# Patient Record
Sex: Female | Born: 1935 | Race: White | Hispanic: No | Marital: Married | Smoking: Never smoker
Health system: Southern US, Community
[De-identification: ages and names within clinical notes are randomized; demographics above are authoritative.]

## PROBLEM LIST (undated history)

## (undated) DIAGNOSIS — T4145XA Adverse effect of unspecified anesthetic, initial encounter: Secondary | ICD-10-CM

## (undated) DIAGNOSIS — T8859XA Other complications of anesthesia, initial encounter: Secondary | ICD-10-CM

## (undated) DIAGNOSIS — R030 Elevated blood-pressure reading, without diagnosis of hypertension: Secondary | ICD-10-CM

## (undated) DIAGNOSIS — IMO0001 Reserved for inherently not codable concepts without codable children: Secondary | ICD-10-CM

## (undated) DIAGNOSIS — I5042 Chronic combined systolic (congestive) and diastolic (congestive) heart failure: Secondary | ICD-10-CM

## (undated) DIAGNOSIS — K219 Gastro-esophageal reflux disease without esophagitis: Secondary | ICD-10-CM

## (undated) DIAGNOSIS — R011 Cardiac murmur, unspecified: Secondary | ICD-10-CM

## (undated) DIAGNOSIS — J45909 Unspecified asthma, uncomplicated: Secondary | ICD-10-CM

## (undated) DIAGNOSIS — C801 Malignant (primary) neoplasm, unspecified: Secondary | ICD-10-CM

## (undated) DIAGNOSIS — R519 Headache, unspecified: Secondary | ICD-10-CM

## (undated) HISTORY — PX: BREAST SURGERY: SHX581

## (undated) HISTORY — DX: Elevated blood-pressure reading, without diagnosis of hypertension: R03.0

## (undated) HISTORY — DX: Reserved for inherently not codable concepts without codable children: IMO0001

---
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
## 1972-01-21 HISTORY — PX: ABDOMINAL HYSTERECTOMY: SHX81

## 1997-11-27 ENCOUNTER — Encounter: Admission: RE | Admit: 1997-11-27 | Discharge: 1997-11-27 | Payer: Self-pay | Admitting: Sports Medicine

## 2001-01-20 LAB — HM MAMMOGRAPHY

## 2003-04-11 ENCOUNTER — Other Ambulatory Visit: Payer: Self-pay

## 2011-08-24 ENCOUNTER — Emergency Department: Payer: Self-pay | Admitting: Emergency Medicine

## 2011-08-24 LAB — CBC
HCT: 38.4 % (ref 35.0–47.0)
HGB: 13.7 g/dL (ref 12.0–16.0)
MCV: 99 fL (ref 80–100)
Platelet: 231 10*3/uL (ref 150–440)
RBC: 3.9 10*6/uL (ref 3.80–5.20)
RDW: 13 % (ref 11.5–14.5)
WBC: 8.8 10*3/uL (ref 3.6–11.0)

## 2011-08-24 LAB — COMPREHENSIVE METABOLIC PANEL
Anion Gap: 9 (ref 7–16)
BUN: 25 mg/dL — ABNORMAL HIGH (ref 7–18)
Chloride: 106 mmol/L (ref 98–107)
Creatinine: 0.98 mg/dL (ref 0.60–1.30)
EGFR (African American): >60
EGFR (Non-African Amer.): 56 — ABNORMAL LOW
Glucose: 157 mg/dL — ABNORMAL HIGH (ref 65–99)
Osmolality: 287 (ref 275–301)
SGOT(AST): 23 U/L (ref 15–37)
SGPT (ALT): 19 U/L (ref 12–78)
Sodium: 140 mmol/L (ref 136–145)
Total Protein: 6.5 g/dL (ref 6.4–8.2)

## 2011-08-24 LAB — CK TOTAL AND CKMB (NOT AT ARMC): CK-MB: 1.9 ng/mL (ref 0.5–3.6)

## 2011-08-24 LAB — PRO B NATRIURETIC PEPTIDE: B-Type Natriuretic Peptide: 68 pg/mL (ref 0–450)

## 2011-10-27 ENCOUNTER — Other Ambulatory Visit: Payer: Self-pay | Admitting: *Deleted

## 2011-10-27 ENCOUNTER — Ambulatory Visit (INDEPENDENT_AMBULATORY_CARE_PROVIDER_SITE_OTHER): Payer: Self-pay | Admitting: Internal Medicine

## 2011-10-27 ENCOUNTER — Encounter: Payer: Self-pay | Admitting: Internal Medicine

## 2011-10-27 VITALS — BP 130/70 | HR 74 | Temp 98.7°F | Ht 61.0 in | Wt 129.2 lb

## 2011-10-27 DIAGNOSIS — J329 Chronic sinusitis, unspecified: Secondary | ICD-10-CM

## 2011-10-27 DIAGNOSIS — M25559 Pain in unspecified hip: Secondary | ICD-10-CM

## 2011-10-27 DIAGNOSIS — J069 Acute upper respiratory infection, unspecified: Secondary | ICD-10-CM

## 2011-10-27 MED ORDER — AZITHROMYCIN 250 MG PO TABS: ORAL_TABLET | ORAL | Status: DC

## 2011-10-27 NOTE — Patient Instructions (Signed)
It was good to see you today.  I am sorry you are not feeling well.  I am going to give you a Zpak to take as instructed.  Flush your nose with saline nasal spray - 2-3x/day.  Plain Robitussin as directed.  I am also going to refer you to an orthopedist for evaluation of the persistant hip and leg pain.

## 2011-10-27 NOTE — Progress Notes (Signed)
  Subjective:    [name], female    DOB: 1933-07-14, 78 y.o.   MRN: [id number]  HPI 78 year old female who comes in today as a work in with concerns regarding increased sinus congestion, cough and sore throat.  Her daughter just recently passed away. Feels she is handling this stress relatively well.  Symptoms started two days ago.  Started with a scratchy throat, nasal congestion (productive of yellow mucus), sore throat and subjective fever.  Some minimal chest congestion and cough.  No sob, chest tightness or pain.  Has been taking vitamin C and a cough suppressant.  She also reported some increased left hip pain and pain that radiates down her upper left leg.  No numbness or tingling.  This has been bothering her for at least six months.  No weakness.  She rides a [employer] - a lot.  Feels this may be aggravating.    Past Medical History  Diagnosis Date  . Elevated blood pressure     Review of Systems Patient denies any headache, lightheadedness or dizziness, but does report increased sinus pressure and symptoms as outlined above.   No chest pain, tightness or palpatations.  No increased shortness of breath or wheezing.   No nausea or vomiting.  No abdominal pain or cramping. Tolerating po's.  No bowel change, such as diarrhea, constipation, BRBPR or melana.  No urine change.        Objective:   Physical Exam Filed Vitals:   10/27/11 1139  BP: 130/70  Pulse: 74  Temp: 98.7 F (96.33 C)   78 year old female in no acute distress.   HEENT:  Nares - clear except slightly erythematous turbinates.  Increased maxillary sinus pressure to palpation.  Left TM - no redness.  Cerumen present right canal.  TM visualized revealed no erythema.  OP- without lesions or erythema.  NECK:  Supple, nontender.  No lymphadenopathy.Marland Kitchen   HEART:  Appears to be regular. LUNGS:  Without crackles or wheezing audible.  Respirations even and unlabored.  Good breath sounds bilaterally.   RADIAL PULSE:   Equal bilaterally.  EXTREMITIES:  No increased edema to be present.  Increased pain to palpation over the left lateral hip.  Increased pain (in the lateral hip) with straight leg raise.  No lower back pain.  No weakness.            Assessment & Plan:  SINUSITIS/URI.  Treat with Zpak as directed.  Saline nasal flushes - 2-3x/day.  Robitussin as directed.  Rest.  Fluids.  Explained to her if symptoms changed, worsened or did not resolve - she was to be reevaluated.    HIP/LEG PAIN.  Exam as outlined.  Previous xray revealed no acute abnormality.  Avoid increased antiinflammatories given problems with elevated blood pressure.  Refer to ortho to see if she would benefit from an injection or further workup/scanning.    ELEVATED BLOOD PRESSURE.  Blood pressure is under good control today.  Have her to continue to follow.

## 2011-10-27 NOTE — Telephone Encounter (Signed)
Rx for Zpak called to [facility] pharmacy.

## 2011-11-03 ENCOUNTER — Telehealth: Payer: Self-pay | Admitting: Internal Medicine

## 2011-11-03 NOTE — Telephone Encounter (Signed)
error 

## 2011-12-10 ENCOUNTER — Emergency Department: Payer: Self-pay | Admitting: Unknown Physician Specialty

## 2011-12-11 ENCOUNTER — Telehealth: Payer: Self-pay | Admitting: Internal Medicine

## 2011-12-11 NOTE — Telephone Encounter (Signed)
Patient returned call. John wolf gave her etodolac. That's what she said had given her the stomach cramps. She wants enough pills to get her through Sunday following thanksgiving. Faxed [facility] for er records.

## 2011-12-11 NOTE — Telephone Encounter (Signed)
Left message for patient to return call.

## 2011-12-11 NOTE — Telephone Encounter (Signed)
Cell phone # [phone] Pt came in today stating she went to [facility] er yesterday for stomach cramps.  They have her on  Prednisone 50mg  tab take one daily this has helped her stomach.  They only gave her 5 pills and pt was concerned that during the thanksgiving her stomach would starting cramping again and wanted to know if dr scott could write her a rx for this in case her stomach starts hurting again [facility] [address] [place]

## 2011-12-11 NOTE — Telephone Encounter (Signed)
I don't know if she has the wrong medicine.  Prednisone is not used to treat stomach cramps.  Need records to review.  Hold until receive.

## 2011-12-11 NOTE — Telephone Encounter (Signed)
Please call pt and find out how she is doing and what they diagnosed her with.  Prednisone is not the usual medicine for stomach cramps.  This is usually not a prescribed as a standing medicine.  Also need er records.

## 2011-12-15 ENCOUNTER — Telehealth: Payer: Self-pay | Admitting: *Deleted

## 2011-12-15 ENCOUNTER — Encounter: Payer: Self-pay | Admitting: *Deleted

## 2011-12-15 DIAGNOSIS — M25559 Pain in unspecified hip: Secondary | ICD-10-CM

## 2011-12-15 NOTE — Telephone Encounter (Signed)
Kathleen Wallace stated that her stomach was better since she stop taking etodolac and changed her diet. She requested PT for her hip   

## 2011-12-15 NOTE — Telephone Encounter (Signed)
i placed order for PT - [facility]  Physical therapy for persistent hip pain

## 2011-12-15 NOTE — Telephone Encounter (Signed)
Mrs. Boreman stated that her stomach was better since she stop taking etodolac and changed her diet. She requested PT for her hip

## 2011-12-19 NOTE — Telephone Encounter (Signed)
Left patient a message on answering machine regarding order for PT

## 2012-02-11 ENCOUNTER — Telehealth: Payer: Self-pay | Admitting: Internal Medicine

## 2012-02-11 NOTE — Telephone Encounter (Signed)
Per Dr. Lorin Picket schedule a CPE after 07/31/12 left message for pt to call back and schedule. Once Scheduled please write date and time on paper medical records (top shelf wooden cabinet [facility]) give back to Dr. Lorin Picket

## 2012-06-04 ENCOUNTER — Ambulatory Visit (INDEPENDENT_AMBULATORY_CARE_PROVIDER_SITE_OTHER): Payer: Medicare Other | Admitting: Internal Medicine

## 2012-06-04 ENCOUNTER — Encounter: Payer: Self-pay | Admitting: Internal Medicine

## 2012-06-04 ENCOUNTER — Ambulatory Visit (INDEPENDENT_AMBULATORY_CARE_PROVIDER_SITE_OTHER)
Admission: RE | Admit: 2012-06-04 | Discharge: 2012-06-04 | Disposition: A | Discharge status: Home / Self Care | Payer: Medicare Other | Source: Ambulatory Visit | Attending: Internal Medicine | Admitting: Internal Medicine

## 2012-06-04 VITALS — BP 160/80 | HR 67 | Temp 98.3°F | Ht 61.0 in | Wt 135.2 lb

## 2012-06-04 DIAGNOSIS — R059 Cough, unspecified: Secondary | ICD-10-CM

## 2012-06-04 DIAGNOSIS — R05 Cough: Secondary | ICD-10-CM

## 2012-06-04 DIAGNOSIS — R011 Cardiac murmur, unspecified: Secondary | ICD-10-CM

## 2012-06-04 DIAGNOSIS — K219 Gastro-esophageal reflux disease without esophagitis: Secondary | ICD-10-CM

## 2012-06-04 MED ORDER — ALBUTEROL SULFATE HFA 108 (90 BASE) MCG/ACT IN AERS: 2.0000 | INHALATION_SPRAY | Freq: Four times a day (QID) | RESPIRATORY_TRACT | Status: DC | PRN

## 2012-06-04 MED ORDER — AZITHROMYCIN 250 MG PO TABS: ORAL_TABLET | ORAL | Status: DC

## 2012-06-04 NOTE — Patient Instructions (Signed)
Flush with saline nasal spray 2-3x/day.  Nasonex - 2 sprays each nostril in the evening.  Take the Zpak as directed.  Mucinex in the am and robitussin in the evening.  Albuterol inhaler - 2 puffs 4x/day as needed.  Let me know if persistent problems.

## 2012-06-06 ENCOUNTER — Encounter: Payer: Self-pay | Admitting: Internal Medicine

## 2012-06-06 DIAGNOSIS — K219 Gastro-esophageal reflux disease without esophagitis: Secondary | ICD-10-CM | POA: Insufficient documentation

## 2012-06-06 DIAGNOSIS — R011 Cardiac murmur, unspecified: Secondary | ICD-10-CM | POA: Insufficient documentation

## 2012-06-06 NOTE — Assessment & Plan Note (Signed)
Treat acid reflux with zantac.  Follow.

## 2012-06-06 NOTE — Progress Notes (Signed)
  Subjective:    [name], female    DOB: 1933-04-23, 79 y.o.   MRN: [id number]  Wheezing   79 year old female who comes in today as a work in with concerns regarding increased sinus congestion, cough and chest congestion.  I saw her in October for similar symptoms.  She was treated.  States she is not sure she ever fully got over that episode.  She has noticed recently, some worsening symptoms.  Increased sinus pressure and nasal congestion.  Increased cough - productive of thick mucus.  Some wheezing occasionally.  No vomiting or diarrhea.  Taking benadryl and vitamins.  Eating.  She states she checked her blood pressure yesterday - 138/80.     Past Medical History  Diagnosis Date  . Elevated blood pressure     Review of Systems  Respiratory: Positive for wheezing.   Patient denies any significant headache, lightheadedness or dizziness, but does report increased sinus pressure and symptoms as outlined above.   No chest pain or palpitations.  Increased cough and wheezing.  Increased chest congestion.  Some minimal acid reflux.  No nausea or vomiting.  No abdominal pain or cramping. Tolerating po's.  No bowel change, such as diarrhea, constipation, BRBPR or melana.  No urine change.        Objective:   Physical Exam  Filed Vitals:   06/04/12 1215  BP: 160/80  Pulse: 67  Temp: 98.3 F (78.53 C)   79 year old female in no acute distress.   HEENT:  Nares - clear except slightly erythematous turbinates.  Increased maxillary sinus pressure to palpation.  Left TM - no redness.  Cerumen present right canal.  TM visualized revealed no erythema.  OP- without lesions or erythema.  NECK:  Supple, nontender.  No lymphadenopathy.Marland Kitchen   HEART:  Appears to be regular.  I-II/VI systolic murmur.  LUNGS:  Without crackles or wheezing audible.  Respirations even and unlabored.  Good breath sounds bilaterally.  Increased cough with forced expiration.  RADIAL PULSE:  Equal bilaterally.           Assessment & Plan:  SINUSITIS/URI.  Treat with Zpak as directed.  Saline nasal flushes - 2-3x/day.  Mucinex/ Robitussin as directed.  Albuterol inhaler as directed.  Treat acid reflux.  Rest.  Fluids.  Explained to her if symptoms changed, worsened or did not resolve - she was to be reevaluated.  Will check cxr given her persistent symptoms.    HIP/LEG PAIN.  Not reported as a problem today.     ELEVATED BLOOD PRESSURE.  Blood pressure elevated today.  She states it is under good control at home.  Have discussed medication with her.  She declines.  Will follow.  Have her to continue to follow.

## 2012-06-06 NOTE — Assessment & Plan Note (Signed)
Discussed with her regarding my desire for an ECHO.  She declines.  Will notify me if she changes her mind.

## 2012-08-02 ENCOUNTER — Ambulatory Visit (INDEPENDENT_AMBULATORY_CARE_PROVIDER_SITE_OTHER): Payer: Medicare Other | Admitting: Internal Medicine

## 2012-08-02 ENCOUNTER — Encounter: Payer: Self-pay | Admitting: Internal Medicine

## 2012-08-02 VITALS — BP 130/80 | HR 81 | Temp 98.3°F | Ht 61.0 in | Wt 130.8 lb

## 2012-08-02 DIAGNOSIS — E7800 Pure hypercholesterolemia, unspecified: Secondary | ICD-10-CM

## 2012-08-02 DIAGNOSIS — R062 Wheezing: Secondary | ICD-10-CM

## 2012-08-02 DIAGNOSIS — R0602 Shortness of breath: Secondary | ICD-10-CM

## 2012-08-02 DIAGNOSIS — Z9109 Other allergy status, other than to drugs and biological substances: Secondary | ICD-10-CM

## 2012-08-02 DIAGNOSIS — R011 Cardiac murmur, unspecified: Secondary | ICD-10-CM

## 2012-08-02 DIAGNOSIS — K219 Gastro-esophageal reflux disease without esophagitis: Secondary | ICD-10-CM

## 2012-08-02 NOTE — Patient Instructions (Signed)
Zantac (ranitidine) 150mg - take 30 minutes before your evening meal.   

## 2012-08-03 ENCOUNTER — Encounter: Payer: Self-pay | Admitting: Internal Medicine

## 2012-08-03 ENCOUNTER — Encounter: Payer: Self-pay | Admitting: Pulmonary Disease

## 2012-08-03 ENCOUNTER — Ambulatory Visit (INDEPENDENT_AMBULATORY_CARE_PROVIDER_SITE_OTHER): Payer: Medicare Other | Admitting: Pulmonary Disease

## 2012-08-03 VITALS — BP 142/82 | HR 78 | Temp 98.7°F | Ht 61.0 in | Wt 131.0 lb

## 2012-08-03 DIAGNOSIS — K219 Gastro-esophageal reflux disease without esophagitis: Secondary | ICD-10-CM

## 2012-08-03 DIAGNOSIS — R062 Wheezing: Secondary | ICD-10-CM | POA: Insufficient documentation

## 2012-08-03 DIAGNOSIS — Z9109 Other allergy status, other than to drugs and biological substances: Secondary | ICD-10-CM

## 2012-08-03 DIAGNOSIS — R0602 Shortness of breath: Secondary | ICD-10-CM

## 2012-08-03 NOTE — Assessment & Plan Note (Addendum)
I have a very high index of suspicion for vocal cord dysfunction here. It would be exceedingly unusual for a 79 year old female developed asthma at this point in life.  Many features of her disease are consistent with vocal cord dysfunction specifically the fact that the wheezing started exactly 48 hours after the death of her 89 year old daughter. Further, exacerbation of the wheezing by postnasal drip and ongoing indigestion and acid reflux are very consistent with a vocal cord abnormality.  She has mild airflow obstruction on pulmonary function testing but this can be a normal finding in a 79 year old.  For now, I think it is reasonable to consider treatment for asthma with the Flovent, but I'm hopeful that we'll be able to stop this.  Plan: -Methacholine challenge -If methacholine challenge is negative then refer her to ear nose and throat for evaluation of vocal cords -Continue Flovent for now -Zyrtec and Nasacort for postnasal drip -Gastroesophageal reflux disease lifestyle modification as well as Zantac as prescribed by her primary care physician -Followup with me in 4-6 weeks

## 2012-08-03 NOTE — Assessment & Plan Note (Signed)
Discussed with her regarding my desire for an ECHO.  She declines.  Will notify me if she changes her mind.

## 2012-08-03 NOTE — Assessment & Plan Note (Signed)
Persistent symptoms.  Previous albuterol inhaler did help some.  Recent CXR negative.  Treat the acid reflux and allergies as outlined.  Gave her a sample of Flovent discus and instructed on proper technique.  Albuterol inhaler as needed.  Also, given persistent symptoms will refer to pulmonary for further evaluation and treatment.

## 2012-08-03 NOTE — Progress Notes (Signed)
  Subjective:    [name], female    DOB: 31-May-1933, 79 y.o.   MRN: [id number]  Wheezing   79 year old female who comes in today for her complete physical exam.  I saw her in October and in May for wheezing.  She comes in today stating she continues to have increased wheezing and increased cough/congestion.  Reports increased cough - productive of thick mucus (occasionally yellow).  Some wheezing - noticed mostly at night.   Occasional nasal stuffiness and sore throat.  Sometimes feels some chest heaviness and feels she is not getting enough air.  Some acid reflux at times.  No vomiting or diarrhea.  Eating well.  Works outside.  Reports previous exposure to multiple chemicals.  Does not wear a mask regularly when she is working.      Past Medical History  Diagnosis Date  . Elevated blood pressure     Current Outpatient Prescriptions on File Prior to Visit  Medication Sig Dispense Refill  . albuterol (PROVENTIL HFA;VENTOLIN HFA) 108 (90 BASE) MCG/ACT inhaler Inhale 2 puffs into the lungs every 6 (six) hours as needed for wheezing.  1 Inhaler  0   No current facility-administered medications on file prior to visit.    Review of Systems  Respiratory: Positive for wheezing.   Patient denies any significant headache, lightheadedness or dizziness, but does report increased nasal stuffiness and symptoms as outlined above.   No chest pain or palpitations.  Does report increased chest heaviness.  Increased cough and wheezing.  Increased chest congestion.  Some minimal acid reflux.  No nausea or vomiting.  No abdominal pain or cramping. Tolerating po's.  No bowel change, such as diarrhea, constipation, BRBPR or melana.  No urine change.        Objective:   Physical Exam  Filed Vitals:   08/02/12 1344  BP: 130/80  Pulse: 81  Temp: 98.3 F (23.69 C)   79 year old female in no acute distress.   HEENT:  Nares- clear.  Oropharynx - without lesions. NECK:  Supple.  Nontender.  No  audible bruit.  HEART:  Appears to be regular.  II/VI systolic murmur.   LUNGS:  No crackles or wheezing audible.  Respirations even and unlabored.  RADIAL PULSE:  Equal bilaterally.    BREASTS:  No nipple discharge or nipple retraction present.  Could not appreciate any distinct nodules or axillary adenopathy.  Implants in place.   ABDOMEN:  Soft, nontender.  Bowel sounds present and normal.  No audible abdominal bruit.  GU:  Not performed.  RECTAL:  Not performed.   EXTREMITIES:  No increased edema present.  DP pulses palpable and equal bilaterally.          Assessment & Plan:  CARDIOVASCULAR.  Given the chest tightness, EKG obtained and revealed SR with no acute ischemic changes.  PACs present.  Discussed with her regarding further cardiac w/u including an ECHO to reevaluate her valve status (given the murmur).  She declines any further cardiac w/up at this time.  Will notify me if symptoms change or if she changes her mind.    HIP/LEG PAIN.  Not reported as a problem today.     ELEVATED BLOOD PRESSURE.  Blood pressure better today.  She states it is under good control at home.  Follow.  Check metabolic panel.   HEALTH MAINTENANCE.  Physical today.  She declines mammogram and colon evaluation.  Declines bone density.

## 2012-08-03 NOTE — Assessment & Plan Note (Signed)
Gave her a sample of Nasonex.  Use saline nasal spray.

## 2012-08-03 NOTE — Assessment & Plan Note (Signed)
Low cholesterol diet.  She has declined medication.  Check lipid panel.

## 2012-08-03 NOTE — Assessment & Plan Note (Signed)
Lifestyle modification encouraged -Agree with Zantac

## 2012-08-03 NOTE — Assessment & Plan Note (Signed)
Intermittent acid reflux.  Cough/wheezing worse at night.  Treat acid reflux with zantac.  Confirm this is not aggravating her current symptoms.  Follow.

## 2012-08-03 NOTE — Assessment & Plan Note (Signed)
Recommended Zyrtec and over-the-counter Nasacort

## 2012-08-03 NOTE — Progress Notes (Signed)
Subjective:    [name], female    DOB: 27-Jul-1933, 79 y.o.   MRN: [id number]  HPI  This is a 79 year old female with a past medical history significant for a heart murmur who comes to our clinic today for evaluation of ongoing wheezing. She stated that she never had respiratory symptoms as a child her throughout adulthood but approximately 2 days after her 38 year old daughter died last year she started developing wheezing. At that point she had a sore throat and was treated with a Z-Pak and had some resolution of the symptom but has had continued wheezing ever since. The wheezing is associated with some shortness of breath. It is exacerbated by exposure to dust, chemicals or fumes. Often she will wake up wheezing in the middle the night and has to cough up mucus which she says is collected around her voice box and upper airway. This is also is associated with significant sinus congestion. Notably, she previously had acid reflux which is well-controlled with the vegetarian diet but in the last year since the death of her daughter she has not been needing as well and states that she has had more "indigestion". She uses albuterol which helps with her symptoms and she was just started on Flovent yesterday. She states that back in May of 2014 she was treated with antibiotics and steroids again for wheezing and a sore throat but states that she continues to have symptoms despite this. She tells me that she has been under a fair amount of stress with a lot of work responsibilities and clearly her family has been upset and distressed with the death of her daughter. She tells that she has suffered with depression since the death. 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 On days during which there is a significant amount of dust her wheezing is worse and on days when she uses pesticides she thinks the wheezing is worse. She has recently  started using a mask.     Past Medical History  Diagnosis Date  . Elevated blood pressure      Family History  Problem Relation Age of Onset  . Stroke Mother   . Hypertension Mother   . Stroke Father   . Hypertension Father   . Diabetes Other      History   Social History  . Marital Status: Single    Spouse Name: N/A    Number of Children: N/A  . Years of Education: N/A   Occupational History  . Not on file.   Social History Main Topics  . Smoking status: Never Smoker   . Smokeless tobacco: Never Used  . Alcohol Use: No  . Drug Use: No  . Sexually Active: Not on file   Other Topics Concern  . Not on file   Social History Narrative  . No narrative on file     Allergies  Allergen Reactions  . Codeine Nausea Only and Other (See Comments)    tachycardia  . Amoxicillin     insomnia     Outpatient Prescriptions Prior to Visit  Medication Sig Dispense Refill  . albuterol (PROVENTIL HFA;VENTOLIN HFA) 108 (90 BASE) MCG/ACT inhaler Inhale 2 puffs into the lungs every 6 (six) hours as needed for wheezing.  1 Inhaler  0  . Ascorbic Acid (VITAMIN C PO) Take by mouth.       No facility-administered medications prior to visit.  Review of Systems  Constitutional: Negative for fever, chills, diaphoresis, activity change, appetite change, fatigue and unexpected weight change.  HENT: Positive for congestion, sore throat and postnasal drip. Negative for hearing loss, ear pain, nosebleeds, facial swelling, rhinorrhea, sneezing, mouth sores, trouble swallowing, neck pain, neck stiffness, dental problem, voice change, sinus pressure, tinnitus and ear discharge.   Eyes: Negative for photophobia, discharge, itching and visual disturbance.  Respiratory: Positive for cough, shortness of breath and wheezing. Negative for apnea, choking, chest tightness and stridor.   Cardiovascular: Negative for chest pain, palpitations and leg swelling.  Gastrointestinal: Negative for  nausea, vomiting, abdominal pain, constipation, blood in stool and abdominal distention.  Genitourinary: Negative for dysuria, urgency, frequency, hematuria, flank pain, decreased urine volume and difficulty urinating.  Musculoskeletal: Negative for myalgias, back pain, joint swelling, arthralgias and gait problem.  Skin: Positive for rash. Negative for color change and pallor.  Neurological: Negative for dizziness, tremors, seizures, syncope, speech difficulty, weakness, light-headedness, numbness and headaches.  Hematological: Negative for adenopathy. Does not bruise/bleed easily.  Psychiatric/Behavioral: Positive for dysphoric mood. Negative for confusion, sleep disturbance and agitation. The patient is not nervous/anxious.        Objective:   Physical Exam  Filed Vitals:   08/03/12 1533  BP: 142/82  Pulse: 78  Temp: 98.7 F (37.1 C)  TempSrc: Oral  Height: 5\' 1"  (1.549 m)  Weight: 59.421 kg (131 lb)  SpO2: 94%   Gen: well appearing, no acute distress HEENT: NCAT, PERRL, EOMi, OP clear, neck supple without masses PULM: inspiratory wheezing, loudest over the proximal trachea CV: RRR, slight systolic murmur, no JVD AB: BS+, soft, nontender, no hsm Ext: warm, no edema, no clubbing, no cyanosis Derm: no rash or skin breakdown Neuro: A&Ox4, CN II-XII intact, strength 5/5 in all 4 extremities  08/03/2012 simple spirometry ratio 66%, FEV1 1.64 L (94% predicted) flow volume loop delayed, consistent with obstruction     Assessment & Plan:   Wheezing I have a very high index of suspicion for vocal cord dysfunction here. It would be exceedingly unusual for a 79 year old female developed asthma at this point in life.  Many features of her disease are consistent with vocal cord dysfunction specifically the fact that the wheezing started exactly 48 hours after the death of her 53 year old daughter. Further, exacerbation of the wheezing by postnasal drip and ongoing indigestion and acid  reflux are very consistent with a vocal cord abnormality.  She has mild airflow obstruction on pulmonary function testing but this can be a normal finding in a 79 year old.  For now, I think it is reasonable to consider treatment for asthma with the Flovent, but I'm hopeful that we'll be able to stop this.  Plan: -Methacholine challenge -If methacholine challenge is negative then refer her to ear nose and throat for evaluation of vocal cords -Continue Flovent for now -Zyrtec and Nasacort for postnasal drip -Gastroesophageal reflux disease lifestyle modification as well as Zantac as prescribed by her primary care physician -Followup with me in 4-6 weeks  GERD (gastroesophageal reflux disease) Lifestyle modification encouraged -Agree with Zantac  Environmental allergies Recommended Zyrtec and over-the-counter Nasacort    Updated Medication List Outpatient Encounter Prescriptions as of 08/03/2012  Medication Sig Dispense Refill  . albuterol (PROVENTIL HFA;VENTOLIN HFA) 108 (90 BASE) MCG/ACT inhaler Inhale 2 puffs into the lungs every 6 (six) hours as needed for wheezing.  1 Inhaler  0  . Ascorbic Acid (VITAMIN C PO) Take by mouth.  No facility-administered encounter medications on file as of 08/03/2012.

## 2012-08-03 NOTE — Patient Instructions (Addendum)
We will arrange a methacholine challenge test at [facility] Keep using your Flovent as you are doing Use your albuterol as you are doing  Use generic zyrtec and over the counter nasacort regularly to help with your sinus congestion  Follow the reflux lifestyle modifications we gave you and use the Zantac as recommended by Dr. Lorin Picket  We will see you back in 4-6 weeks or sooner if needed

## 2012-09-01 ENCOUNTER — Telehealth: Payer: Self-pay | Admitting: Pulmonary Disease

## 2012-09-01 NOTE — Telephone Encounter (Signed)
Called pt to let her know that we do not do methacholine challenges here at [facility] office. She is scheduled for Friday at 9am at the [place] office. I have rescheduled her for 09/06/2012 at 11am at [facility].  [facility]

## 2012-09-02 NOTE — Telephone Encounter (Signed)
Pt is aware that her appointment has been changed. States that this date and time is not convenient for her. I gave her the # to respiratory to she can reschedule.

## 2012-09-03 ENCOUNTER — Telehealth: Payer: Self-pay | Admitting: Pulmonary Disease

## 2012-09-03 NOTE — Telephone Encounter (Signed)
I spoke with pt and advised her once she has the test they should fax results over to [place]. Nothing further needed

## 2012-09-06 ENCOUNTER — Ambulatory Visit (HOSPITAL_COMMUNITY)
Admission: RE | Admit: 2012-09-06 | Discharge: 2012-09-06 | Disposition: A | Discharge status: Home / Self Care | Payer: Medicare Other | Source: Ambulatory Visit | Attending: Pulmonary Disease | Admitting: Pulmonary Disease

## 2012-09-06 DIAGNOSIS — R0602 Shortness of breath: Secondary | ICD-10-CM | POA: Insufficient documentation

## 2012-09-06 MED ORDER — ALBUTEROL SULFATE (5 MG/ML) 0.5% IN NEBU
2.5000 mg | INHALATION_SOLUTION | Freq: Once | RESPIRATORY_TRACT | Status: AC
  Administered 2012-09-06: 2.5 mg via RESPIRATORY_TRACT

## 2012-09-06 MED ORDER — METHACHOLINE 1 MG/ML NEB SOLN
2.0000 mL | Freq: Once | RESPIRATORY_TRACT | Status: AC
  Administered 2012-09-06: 2 mg via RESPIRATORY_TRACT

## 2012-09-06 MED ORDER — METHACHOLINE 0.0625 MG/ML NEB SOLN
2.0000 mL | Freq: Once | RESPIRATORY_TRACT | Status: AC
  Administered 2012-09-06: 0.125 mg via RESPIRATORY_TRACT

## 2012-09-06 MED ORDER — METHACHOLINE 16 MG/ML NEB SOLN
2.0000 mL | Freq: Once | RESPIRATORY_TRACT | Status: AC
  Administered 2012-09-06: 32 mg via RESPIRATORY_TRACT

## 2012-09-06 MED ORDER — SODIUM CHLORIDE 0.9 % IN NEBU
3.0000 mL | INHALATION_SOLUTION | Freq: Once | RESPIRATORY_TRACT | Status: AC
  Administered 2012-09-06: 3 mL via RESPIRATORY_TRACT

## 2012-09-06 MED ORDER — METHACHOLINE 4 MG/ML NEB SOLN
2.0000 mL | Freq: Once | RESPIRATORY_TRACT | Status: AC
  Administered 2012-09-06: 8 mg via RESPIRATORY_TRACT

## 2012-09-06 MED ORDER — METHACHOLINE 0.25 MG/ML NEB SOLN
2.0000 mL | Freq: Once | RESPIRATORY_TRACT | Status: AC
  Administered 2012-09-06: 0.5 mg via RESPIRATORY_TRACT

## 2012-09-07 ENCOUNTER — Encounter: Payer: Self-pay | Admitting: Pulmonary Disease

## 2012-09-07 ENCOUNTER — Ambulatory Visit (INDEPENDENT_AMBULATORY_CARE_PROVIDER_SITE_OTHER): Payer: Medicare Other | Admitting: Pulmonary Disease

## 2012-09-07 VITALS — BP 142/82 | HR 74 | Ht 61.0 in | Wt 129.0 lb

## 2012-09-07 DIAGNOSIS — K219 Gastro-esophageal reflux disease without esophagitis: Secondary | ICD-10-CM

## 2012-09-07 DIAGNOSIS — Z9109 Other allergy status, other than to drugs and biological substances: Secondary | ICD-10-CM

## 2012-09-07 DIAGNOSIS — R062 Wheezing: Secondary | ICD-10-CM

## 2012-09-07 NOTE — Assessment & Plan Note (Signed)
Her methacholine challenge test was negative, so she does not have asthma. 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  Plan: -treat allergic rhinitis and GERD aggressively as these will exacerbate VCD -if no improvement or recurrence in symptoms, then see [facility] for vocal cord evaluation

## 2012-09-07 NOTE — Patient Instructions (Addendum)
Take generic Zyrtec on the days you are out in the grass (cetirizine) Use a mask when you are out in the grass on the lawn mower  Follow the GERD lifestyle modification sheet we gave you and use Zantac at night if you still have the cough when you lay down  If you have recurrent wheezing, cough, we think you should see [facility] to evaluate your vocal cords

## 2012-09-07 NOTE — Assessment & Plan Note (Signed)
Use a mask when out on the lawn mower, zyrtec on those days as well (generic OK)

## 2012-09-07 NOTE — Progress Notes (Signed)
  Subjective:    [name], female    DOB: 11-29-1933, 79 y.o.   MRN: [id number]  Synopsis: Kathleen Wallace first saw the [facility] pulmonary clinic in the summer of 2014 for the evaluation of cough, chest tightness, and wheezing. The symptoms started suddenly after the death of her daughter in November 17, 2012. She has allergic rhinitis and acid reflux. A methacholine challenge test in August of 2014 was read as negative.  HPI  09/07/2012 ROV >> Kathleen Wallace returns to clinic today stating that her wheezing and shortness of breath have improved. She notes that the wheezing is worse on days when she is experiencing more sinus congestion. This seems to be correlated with heavy grass and sometimes pesticide exposure. Her acid reflux symptoms have improved a little bit. She is not taking the Zantac. She still has some cough when she lies flat.  Past Medical History  Diagnosis Date  . Elevated blood pressure       Review of Systems  Constitutional: Negative for fever, chills and fatigue.  HENT: Positive for congestion, rhinorrhea and postnasal drip.   Respiratory: Positive for cough and wheezing. Negative for shortness of breath.   Cardiovascular: Negative for chest pain, palpitations and leg swelling.       Objective:   Physical Exam  Filed Vitals:   09/07/12 1538  BP: 142/82  Pulse: 74  Height: 5\' 1"  (1.549 m)  Weight: 129 lb (58.514 kg)  SpO2: 95%   Gen: well appearing, no acute distress HEENT: NCAT,  EOMi, OP clear, masses PULM: CTA B CV: RRR, no mgr, no JVD AB: BS+, soft, nontender, no hsm Ext: warm, no edema, no clubbing, no cyanosis  09/07/2012 methacholine challenge negative      Assessment & Plan:   Wheezing Her methacholine challenge test was negative, so she does not have asthma. 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  Plan: -treat allergic rhinitis and GERD aggressively as these will exacerbate  VCD -if no improvement or recurrence in symptoms, then see [facility] for vocal cord evaluation  GERD (gastroesophageal reflux disease) Advised lifestyle modification changes and nightly zantac  Environmental allergies Use a mask when out on the lawn mower, zyrtec on those days as well (generic OK)   Updated Medication List Outpatient Encounter Prescriptions as of 09/07/2012  Medication Sig Dispense Refill  . albuterol (PROVENTIL HFA;VENTOLIN HFA) 108 (90 BASE) MCG/ACT inhaler Inhale 2 puffs into the lungs every 6 (six) hours as needed for wheezing.  1 Inhaler  0  . Ascorbic Acid (VITAMIN C PO) Take by mouth.       No facility-administered encounter medications on file as of 09/07/2012.

## 2012-09-07 NOTE — Assessment & Plan Note (Signed)
Advised lifestyle modification changes and nightly zantac

## 2012-10-28 ENCOUNTER — Telehealth: Payer: Self-pay | Admitting: Internal Medicine

## 2012-10-28 NOTE — Telephone Encounter (Signed)
I do not think the ranitidine is causing her blood pressure to go up.  She has had issues previously with her blood pressure being elevated.  Increase sodium intake and eating food with increased salt - can elevate the blood pressure.  Would decrease sodium/salt intake.  Given she is having issues with her blood pressure - she needs eval.   If agreeable, can schedule appt.  If has been sick, sometimes this can affect blood pressure as well.  If she refuses appt, needs to monitor her blood pressure and record and if persistent elevation - will need eval.

## 2012-10-28 NOTE — Telephone Encounter (Signed)
Pt notified & states that she will call back for appt if sx's persist

## 2012-10-28 NOTE — Telephone Encounter (Signed)
Pt states she was seen by another Dr. For her sinuses, diagnosed as a bacterial problem and prescribed ranitidine 2 a day, but she is only taking 1 a day. Pt states she also takes Benadryl on occasion.  Pt states her BP is up and she is not sure if it is due to the new medication ranitidine along with the Benadryl.  States she takes benadryl occasionally and it alone does not usually cause a jump in her BP.  States she did also eat some ham and that could be what has caused her BP to go up.  Please advise if there is any interaction between Benadryl and ranitidine.

## 2012-11-09 ENCOUNTER — Telehealth: Payer: Self-pay | Admitting: Internal Medicine

## 2012-11-09 NOTE — Telephone Encounter (Signed)
Forms placed in your green folder

## 2012-11-09 NOTE — Telephone Encounter (Signed)
I will need to see her for her form to be completed - given some of the questions.  They ask for vision check, cardiac issues, etc.  She recently called in with elevated blood pressure.  Needs eval for me to complete.  I can work her in somewhere.  Let me know that she is agreeable to do this and will get her an appt.

## 2012-11-09 NOTE — Telephone Encounter (Signed)
Pt dropped off medical report for automobile insurance Need asap In box

## 2012-11-10 NOTE — Telephone Encounter (Signed)
Left message for patient to call the office to schedule appt to complete medical form 

## 2012-11-10 NOTE — Telephone Encounter (Signed)
I spoke with husband this morning while he was in the office for labs & informed him to stop & schedule appt at front desk for both of them.

## 2012-11-25 ENCOUNTER — Ambulatory Visit (INDEPENDENT_AMBULATORY_CARE_PROVIDER_SITE_OTHER): Payer: Medicare Other | Admitting: Internal Medicine

## 2012-11-25 ENCOUNTER — Encounter: Payer: Self-pay | Admitting: Internal Medicine

## 2012-11-25 VITALS — BP 140/80 | HR 67 | Temp 98.2°F | Ht 61.0 in | Wt 134.0 lb

## 2012-11-25 DIAGNOSIS — K219 Gastro-esophageal reflux disease without esophagitis: Secondary | ICD-10-CM

## 2012-11-25 DIAGNOSIS — R011 Cardiac murmur, unspecified: Secondary | ICD-10-CM

## 2012-11-25 DIAGNOSIS — Z9109 Other allergy status, other than to drugs and biological substances: Secondary | ICD-10-CM

## 2012-11-25 DIAGNOSIS — E7800 Pure hypercholesterolemia, unspecified: Secondary | ICD-10-CM

## 2012-11-25 NOTE — Progress Notes (Signed)
Pre-visit discussion using our clinic review tool. No additional management support is needed unless otherwise documented below in the visit note.  

## 2012-11-28 ENCOUNTER — Encounter: Payer: Self-pay | Admitting: Internal Medicine

## 2012-11-28 NOTE — Assessment & Plan Note (Signed)
Not an issue now.  Follow.    

## 2012-11-28 NOTE — Assessment & Plan Note (Signed)
Low cholesterol diet.  She has declined medication.  Follow lipid panel.

## 2012-11-28 NOTE — Progress Notes (Signed)
  Subjective:    [name], female    DOB: 08-23-1933, 79 y.o.   MRN: [id number]  HPI 79 year old female who comes in today as a work in to have an insurance form completed.  She states she is doing well.  Her blood pressure has normalized (per her report on home checks).  States her blood pressure at home averages 128-136/65-75.  Stays active.  No cardiac symptoms with increased activity or exertion.  Saw Dr Andee Poles and Dr Kendrick Fries.  Had laryngoscopy.  Treating acid reflux.  On ranitidine.  Doing well now.  No cough or congestion.  No sob.  No trouble breathing.  No wheezing.  States that one week ago she experienced a sore throat and laryngitis.  Called ENT.  Currently taking keflex.  Feels better now.  No problems lifting or carrying objects.  No headache or dizziness.      Past Medical History  Diagnosis Date  . Elevated blood pressure     Current Outpatient Prescriptions on File Prior to Visit  Medication Sig Dispense Refill  . albuterol (PROVENTIL HFA;VENTOLIN HFA) 108 (90 BASE) MCG/ACT inhaler Inhale 2 puffs into the lungs every 6 (six) hours as needed for wheezing.  1 Inhaler  0  . Ascorbic Acid (VITAMIN C PO) Take by mouth.       No current facility-administered medications on file prior to visit.    Review of Systems Patient denies any headache, lightheadedness or dizziness, but does report previous sinus congestions and symptoms as outlined above.  Improved/resolving with the current abx.   No chest pain, tightness or palpitations.  No increased shortness of breath or wheezing.   No nausea or vomiting.  No abdominal pain or cramping. Tolerating po's.  No bowel change, such as diarrhea, constipation, BRBPR or melana.  No urine change.   Feels good.  Stays active.  No cardiac symptoms with increased activity or exertion.  No syncope or near syncope.      Objective:   Physical Exam  Filed Vitals:   11/25/12 1101  BP: 140/80  Pulse: 67  Temp: 98.2 F (74.46 C)   79  year old female in no acute distress.   HEENT:  Nares - clear.  OP- without lesions or erythema.  NECK:  Supple, nontender.  No lymphadenopathy.Marland Kitchen   HEART:  Appears to be regular. LUNGS:  Without crackles or wheezing audible.  Respirations even and unlabored.  Good breath sounds bilaterally.   RADIAL PULSE:  Equal bilaterally.  EXTREMITIES:  No increased edema to be present. weakness.            Assessment & Plan:  SINUSITIS/URI.  Resolved with current treatment.  Follow.     HIP/LEG PAIN.  Denies as a problem today.     ELEVATED BLOOD PRESSURE.  Blood pressure as outlined.  Her checks under good control.  Follow.    Have her to continue to follow.   FORM COMPLETION.  Form completed.

## 2012-11-28 NOTE — Assessment & Plan Note (Signed)
Intermittent acid reflux.   Treat acid reflux with zantac.  Symptoms resolved.  Follow.  Saw ENT.

## 2012-11-28 NOTE — Assessment & Plan Note (Signed)
Have discussed with her regarding my desire for an ECHO in the past.  She has declined.  She is currently asymptomatic.  Very active with no cardiac symptoms.   No syncope or near syncope.

## 2013-11-24 ENCOUNTER — Ambulatory Visit (INDEPENDENT_AMBULATORY_CARE_PROVIDER_SITE_OTHER): Payer: Medicare Other | Admitting: Internal Medicine

## 2013-11-24 ENCOUNTER — Encounter: Payer: Self-pay | Admitting: Internal Medicine

## 2013-11-24 VITALS — BP 130/80 | HR 87 | Temp 98.4°F | Ht 61.0 in | Wt 129.8 lb

## 2013-11-24 DIAGNOSIS — R011 Cardiac murmur, unspecified: Secondary | ICD-10-CM

## 2013-11-24 DIAGNOSIS — J019 Acute sinusitis, unspecified: Secondary | ICD-10-CM

## 2013-11-24 DIAGNOSIS — E7800 Pure hypercholesterolemia, unspecified: Secondary | ICD-10-CM

## 2013-11-24 DIAGNOSIS — K219 Gastro-esophageal reflux disease without esophagitis: Secondary | ICD-10-CM

## 2013-11-24 DIAGNOSIS — Z658 Other specified problems related to psychosocial circumstances: Secondary | ICD-10-CM

## 2013-11-24 DIAGNOSIS — Z91048 Other nonmedicinal substance allergy status: Secondary | ICD-10-CM

## 2013-11-24 DIAGNOSIS — Z9109 Other allergy status, other than to drugs and biological substances: Secondary | ICD-10-CM

## 2013-11-24 DIAGNOSIS — F439 Reaction to severe stress, unspecified: Secondary | ICD-10-CM

## 2013-11-24 DIAGNOSIS — J329 Chronic sinusitis, unspecified: Secondary | ICD-10-CM | POA: Insufficient documentation

## 2013-11-24 NOTE — Progress Notes (Signed)
Subjective:    [name], female    DOB: [birth year], 80 y.o.   MRN: [id number]  HPI 80 year old female who comes in today for her physical exam. She states she is doing well.  Her blood pressure doing well (per her report on home checks).  Stays active.  No cardiac symptoms with increased activity or exertion.  Saw Dr Pryor Ochoa and Dr [place] Bells previously.   Had laryngoscopy.  Treating acid reflux.  On ranitidine.  Was recently evaluated by Dr Pryor Ochoa for increased cough and congestion.  He has her on omnicef, tessalon perles and using proair.  Doing better.  Still some residual symptoms, but much better.  No headache or dizziness.  Some increased stress dealing with the death of her daughter.  Discussed counseling.       Past Medical History  Diagnosis Date  . Elevated blood pressure     Current Outpatient Prescriptions on File Prior to Visit  Medication Sig Dispense Refill  . albuterol (PROVENTIL HFA;VENTOLIN HFA) 108 (90 BASE) MCG/ACT inhaler Inhale 2 puffs into the lungs every 6 (six) hours as needed for wheezing. 1 Inhaler 0  . Ascorbic Acid (VITAMIN C PO) Take by mouth.    . ranitidine (ZANTAC) 150 MG tablet Take 150 mg by mouth daily.     No current facility-administered medications on file prior to visit.    Review of Systems Patient denies any headache, lightheadedness or dizziness, but does report previous sinus congestions and symptoms as outlined above.  Improved/resolving with the current abx.   No chest pain, tightness or palpitations.  No increased shortness of breath or wheezing.   No nausea or vomiting.  No abdominal pain or cramping. Tolerating po's.  No bowel change, such as diarrhea, constipation, BRBPR or melana.  No urine change.   Feels good.  Stays active.  No cardiac symptoms with increased activity or exertion.  No syncope or near syncope.      Objective:   Physical Exam  Filed Vitals:   11/24/13 0802  BP: 130/80  Pulse: 87  Temp: 98.4 F (46.18 C)    80 year old female in no acute distress.   HEENT:  Nares- clear.  Oropharynx - without lesions. NECK:  Supple.  Nontender.  No audible bruit.  HEART:  Appears to be regular. LUNGS:  No crackles or wheezing audible.  Respirations even and unlabored.  RADIAL PULSE:  Equal bilaterally.    BREASTS:  No nipple discharge or nipple retraction present.  Could not appreciate any distinct nodules or axillary adenopathy. Implants in place.   ABDOMEN:  Soft, nontender.  Bowel sounds present and normal.  No audible abdominal bruit.  GU:  Not performed.   EXTREMITIES:  No increased edema present.  DP pulses palpable and equal bilaterally.          Assessment & Plan:  1. Gastroesophageal reflux disease, esophagitis presence not specified On ranitidine.  No issues reported today.   2. Heart murmur Have discussed with her regarding ECHO.  She declines.  Asymptomatic.  Follow.    3. Hypercholesterolemia She declines cholesterol medication.  Follow.  Check lipid panel.   4. Environmental allergies Has been doing well.  Currently being treated for cough and congestion.  Seeing Dr Pryor Ochoa.  Better.  Follow.   5. Stress Increased stress with dealing with the death of her daughter.  Gave her numbers to counselors that I use.  Notify me if feels needs something more.  6. Acute sinusitis, recurrence not specified, unspecified location See above.  On omnicef.  Use saline and steroid nasal spray as directed.  Follow.    HEALTH MAINTENANCE.  Physical today.  She declines mammogram and colon evaluation.  Discussed with her today.  She declines.

## 2013-11-27 ENCOUNTER — Encounter: Payer: Self-pay | Admitting: Internal Medicine

## 2013-11-30 ENCOUNTER — Emergency Department: Payer: Self-pay | Admitting: Emergency Medicine

## 2014-04-18 ENCOUNTER — Encounter: Payer: Self-pay | Admitting: Internal Medicine

## 2014-04-18 ENCOUNTER — Other Ambulatory Visit: Payer: Medicare Other

## 2014-04-18 ENCOUNTER — Ambulatory Visit (INDEPENDENT_AMBULATORY_CARE_PROVIDER_SITE_OTHER): Payer: Medicare Other | Admitting: Internal Medicine

## 2014-04-18 ENCOUNTER — Telehealth: Payer: Self-pay | Admitting: Internal Medicine

## 2014-04-18 VITALS — BP 189/75 | HR 75 | Temp 97.9°F | Ht 61.0 in | Wt 132.0 lb

## 2014-04-18 DIAGNOSIS — Z658 Other specified problems related to psychosocial circumstances: Secondary | ICD-10-CM

## 2014-04-18 DIAGNOSIS — R42 Dizziness and giddiness: Secondary | ICD-10-CM | POA: Diagnosis not present

## 2014-04-18 DIAGNOSIS — M25511 Pain in right shoulder: Secondary | ICD-10-CM

## 2014-04-18 DIAGNOSIS — I10 Essential (primary) hypertension: Secondary | ICD-10-CM | POA: Diagnosis not present

## 2014-04-18 DIAGNOSIS — E7800 Pure hypercholesterolemia, unspecified: Secondary | ICD-10-CM

## 2014-04-18 DIAGNOSIS — F439 Reaction to severe stress, unspecified: Secondary | ICD-10-CM

## 2014-04-18 DIAGNOSIS — M25512 Pain in left shoulder: Secondary | ICD-10-CM

## 2014-04-18 DIAGNOSIS — J019 Acute sinusitis, unspecified: Secondary | ICD-10-CM

## 2014-04-18 LAB — URINALYSIS, ROUTINE W REFLEX MICROSCOPIC
Bilirubin Urine: NEGATIVE
Hgb urine dipstick: NEGATIVE
Ketones, ur: NEGATIVE
Leukocytes, UA: NEGATIVE
NITRITE: NEGATIVE
Total Protein, Urine: NEGATIVE
URINE GLUCOSE: NEGATIVE
Urobilinogen, UA: 0.2 (ref 0.0–1.0)
pH: 5.5 (ref 5.0–8.0)

## 2014-04-18 LAB — COMPREHENSIVE METABOLIC PANEL
ALBUMIN: 4.2 g/dL (ref 3.5–5.2)
ALK PHOS: 58 U/L (ref 39–117)
ALT: 17 U/L (ref 0–35)
AST: 22 U/L (ref 0–37)
BUN: 18 mg/dL (ref 6–23)
CALCIUM: 9.5 mg/dL (ref 8.4–10.5)
CO2: 25 mEq/L (ref 19–32)
CREATININE: 0.8 mg/dL (ref 0.40–1.20)
Chloride: 104 mEq/L (ref 96–112)
GFR: 73.6 mL/min (ref >60.00)
Glucose, Bld: 81 mg/dL (ref 70–99)
Potassium: 4.7 mEq/L (ref 3.5–5.1)
SODIUM: 137 meq/L (ref 135–145)
Total Bilirubin: 0.3 mg/dL (ref 0.2–1.2)
Total Protein: 7 g/dL (ref 6.0–8.3)

## 2014-04-18 LAB — TSH: TSH: 1.51 u[IU]/mL (ref 0.35–4.50)

## 2014-04-18 MED ORDER — LISINOPRIL 10 MG PO TABS: 10.0000 mg | ORAL_TABLET | Freq: Every day | ORAL | Status: DC

## 2014-04-18 NOTE — Telephone Encounter (Signed)
I can see her on 04/08/14 - at 3:00 - block 30 min)

## 2014-04-18 NOTE — Telephone Encounter (Signed)
I believe she meant 05/09/14 @ 3pm

## 2014-04-18 NOTE — Telephone Encounter (Signed)
Patient was asked to make a 3wk follow up but there are not any open slots.  Please advise as to where you would like to put the patient on the schedule.  Patient said Fridays are better for her for appointments.

## 2014-04-18 NOTE — Progress Notes (Signed)
Pre visit review using our clinic review tool, if applicable. No additional management support is needed unless otherwise documented below in the visit note. 

## 2014-04-18 NOTE — Telephone Encounter (Signed)
Patient was notified by voicemail that her 3wk f/u appt was made for 05/09/14 at 3:00pm.

## 2014-04-19 LAB — CBC WITH DIFFERENTIAL/PLATELET
BASOS PCT: 0 % (ref 0–1)
Basophils Absolute: 0 10*3/uL (ref 0.0–0.1)
Eosinophils Absolute: 0.3 10*3/uL (ref 0.0–0.7)
Eosinophils Relative: 4 % (ref 0–5)
HCT: 42.5 % (ref 36.0–46.0)
Hemoglobin: 13.9 g/dL (ref 12.0–15.0)
Lymphocytes Relative: 34 % (ref 12–46)
Lymphs Abs: 2.7 10*3/uL (ref 0.7–4.0)
MCH: 31 pg (ref 26.0–34.0)
MCHC: 32.7 g/dL (ref 30.0–36.0)
MCV: 94.7 fL (ref 78.0–100.0)
MONO ABS: 0.6 10*3/uL (ref 0.1–1.0)
MONOS PCT: 8 % (ref 3–12)
MPV: 9.7 fL (ref 8.6–12.4)
NEUTROS ABS: 4.3 10*3/uL (ref 1.7–7.7)
Neutrophils Relative %: 54 % (ref 43–77)
Platelets: 275 10*3/uL (ref 150–400)
RBC: 4.49 MIL/uL (ref 3.87–5.11)
RDW: 14.4 % (ref 11.5–15.5)
WBC: 7.9 10*3/uL (ref 4.0–10.5)

## 2014-04-20 ENCOUNTER — Encounter: Payer: Self-pay | Admitting: Internal Medicine

## 2014-04-20 DIAGNOSIS — R42 Dizziness and giddiness: Secondary | ICD-10-CM | POA: Insufficient documentation

## 2014-04-20 DIAGNOSIS — M25519 Pain in unspecified shoulder: Secondary | ICD-10-CM | POA: Insufficient documentation

## 2014-04-20 DIAGNOSIS — M25511 Pain in right shoulder: Secondary | ICD-10-CM | POA: Insufficient documentation

## 2014-04-20 DIAGNOSIS — I10 Essential (primary) hypertension: Secondary | ICD-10-CM | POA: Insufficient documentation

## 2014-04-20 NOTE — Assessment & Plan Note (Signed)
Had the episode of dizziness a few days ago.  Woke with dizziness.  Some dull headache.  Not severe.  No dizziness or significant headache today.  Treat blood pressure as outlined.  Neck and shoulder exercises.  Discussed further w/up including MRI, etc.  She wants to start the blood pressure medication and monitor for now.  Desires no further testing at this time.  Follow.  Get her back in soon to reassess.

## 2014-04-20 NOTE — Assessment & Plan Note (Signed)
Blood pressure elevated.  Probably multifactorial.  Some increased stress.  Also with increased tension and discomfort in her neck and shoulders.  She reports present since injury in the fall.  Blood pressure recheck prior to leaving - 158/78.  Start lisinopril 10mg  q day.  Check metabolic panel today and on f/u visit - since starting lisinopril.  Follow pressures.  Get her back in soon to reassess.

## 2014-04-20 NOTE — Assessment & Plan Note (Signed)
Increased stress.  Discussed at length with her today.  Follow.

## 2014-04-22 ENCOUNTER — Encounter: Payer: Self-pay | Admitting: Internal Medicine

## 2014-04-22 NOTE — Assessment & Plan Note (Signed)
Bilateral shoulder pain and neck pain.  Persistent after injury.  Exercise.  Follow.  Has been to therapy.  May need referral back to ortho.

## 2014-04-22 NOTE — Progress Notes (Signed)
[name], female   DOB: May 10, 1933, 81 y.o.   MRN: [id number]   Subjective:    [name], female    DOB: [birth year], 81 y.o.   MRN: [id number]  HPI  Patient here as a work in with concerns regarding increased blood pressure and dizziness.  She reports increased stress recently.  Had a recent death in the family.  Also had an injury back in the fall. Went to ortho and therapy.  Has had persistent neck and shoulder pain.  Has been working.  Stays very active.  Woke up a few days ago.  Dizzy.  Dull headache.  The dizziness lasted most of the day.  Blood pressure varied - 145-185/75.  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  Blood pressure 218/90.  No significant headache today.  No chest pain or tightness.  No sob.  Still working.  Feels the increased blood pressure is related to increased stress.  Eating and drinking well.     Past Medical History  Diagnosis Date  . Elevated blood pressure     Current Outpatient Prescriptions on File Prior to Visit  Medication Sig Dispense Refill  . albuterol (PROAIR HFA) 108 (90 BASE) MCG/ACT inhaler Inhale into the lungs every 6 (six) hours as needed for wheezing or shortness of breath.    Marland Kitchen albuterol (PROVENTIL HFA;VENTOLIN HFA) 108 (90 BASE) MCG/ACT inhaler Inhale 2 puffs into the lungs every 6 (six) hours as needed for wheezing. 1 Inhaler 0  . Ascorbic Acid (VITAMIN C PO) Take by mouth.    . ranitidine (ZANTAC) 150 MG tablet Take 150 mg by mouth daily.     No current facility-administered medications on file prior to visit.    Review of Systems  Constitutional: Negative for appetite change and unexpected weight change.  HENT: Negative for congestion and sinus pressure.   Respiratory: Negative for cough, chest tightness and shortness of breath.   Cardiovascular: Negative for chest pain, palpitations and leg swelling.  Gastrointestinal: Negative for nausea, vomiting, abdominal pain and diarrhea.  Musculoskeletal: Positive for neck  pain.       Bilateral shoulder pain and neck pain.    Skin: Negative for color change and rash.  Neurological: Positive for dizziness, light-headedness and headaches.       Objective:     Blood pressure recheck:  178/84, pulse 76 and blood pressure recheck prior to leaving:  158/78  Physical Exam  Constitutional: She appears well-developed and well-nourished. No distress.  HENT:  Nose: Nose normal.  Mouth/Throat: Oropharynx is clear and moist.  Neck: Neck supple. No thyromegaly present.  Cardiovascular: Normal rate and regular rhythm.   Pulmonary/Chest: Breath sounds normal. No respiratory distress. She has no wheezes.  Abdominal: Soft. Bowel sounds are normal. There is no tenderness.  Musculoskeletal: She exhibits no edema or tenderness.  Lymphadenopathy:    She has no cervical adenopathy.  Skin: No rash noted. No erythema.    BP 189/75 mmHg  Pulse 75  Temp(Src) 97.9 F (36.6 C) (Oral)  Ht 5\' 1"  (1.549 m)  Wt 132 lb (59.875 kg)  BMI 24.95 kg/m2  SpO2 96% Wt Readings from Last 3 Encounters:  04/18/14 132 lb (59.875 kg)  11/24/13 129 lb 12 oz (58.854 kg)  11/25/12 134 lb (60.782 kg)     Lab Results  Component Value Date   WBC 7.9 04/18/2014   HGB 13.9 04/18/2014   HCT 42.5 04/18/2014   PLT 275 04/18/2014   GLUCOSE 81 04/18/2014  ALT 17 04/18/2014   AST 22 04/18/2014   NA 137 04/18/2014   K 4.7 04/18/2014   CL 104 04/18/2014   CREATININE 0.80 04/18/2014   BUN 18 04/18/2014   CO2 25 04/18/2014   TSH 1.51 04/18/2014       Assessment & Plan:   Problem List Items Addressed This Visit    Dizziness - Primary    Had the episode of dizziness a few days ago.  Woke with dizziness.  Some dull headache.  Not severe.  No dizziness or significant headache today.  Treat blood pressure as outlined.  Neck and shoulder exercises.  Discussed further w/up including MRI, etc.  She wants to start the blood pressure medication and monitor for now.  Desires no further testing  at this time.  Follow.  Get her back in soon to reassess.        Essential hypertension    Blood pressure elevated.  Probably multifactorial.  Some increased stress.  Also with increased tension and discomfort in her neck and shoulders.  She reports present since injury in the fall.  Blood pressure recheck prior to leaving - 158/78.  Start lisinopril 10mg  q day.  Check metabolic panel today and on f/u visit - since starting lisinopril.  Follow pressures.  Get her back in soon to reassess.        Relevant Medications   lisinopril (PRINIVIL,ZESTRIL) tablet   Other Relevant Orders   EKG 12-Lead (Completed)   Urinalysis, Routine w reflex microscopic (Completed)   Hypercholesterolemia    Low cholesterol diet and exercise.  Follow lipid panel.        Relevant Medications   lisinopril (PRINIVIL,ZESTRIL) tablet   Shoulder pain    Bilateral shoulder pain and neck pain.  Persistent after injury.  Exercise.  Follow.  Has been to therapy.  May need referral back to ortho.        Sinusitis   Stress    Increased stress.  Discussed at length with her today.  Follow.         Other Visit Diagnoses    Dizziness and giddiness        Relevant Orders    EKG 12-Lead (Completed)      I spent 25 minutes with the patient and more than 50% of the time was spent in consultation regarding the above.     Einar Pheasant, MD

## 2014-04-22 NOTE — Assessment & Plan Note (Signed)
Low cholesterol diet and exercise.  Follow lipid panel.   

## 2014-05-09 ENCOUNTER — Ambulatory Visit (INDEPENDENT_AMBULATORY_CARE_PROVIDER_SITE_OTHER): Payer: Medicare Other | Admitting: Internal Medicine

## 2014-05-09 ENCOUNTER — Other Ambulatory Visit: Payer: Self-pay | Admitting: Internal Medicine

## 2014-05-09 ENCOUNTER — Encounter: Payer: Self-pay | Admitting: Internal Medicine

## 2014-05-09 VITALS — BP 128/69 | HR 67 | Temp 98.2°F | Ht 61.0 in | Wt 129.1 lb

## 2014-05-09 DIAGNOSIS — R42 Dizziness and giddiness: Secondary | ICD-10-CM

## 2014-05-09 DIAGNOSIS — F439 Reaction to severe stress, unspecified: Secondary | ICD-10-CM

## 2014-05-09 DIAGNOSIS — Z658 Other specified problems related to psychosocial circumstances: Secondary | ICD-10-CM | POA: Diagnosis not present

## 2014-05-09 DIAGNOSIS — I10 Essential (primary) hypertension: Secondary | ICD-10-CM | POA: Diagnosis not present

## 2014-05-09 NOTE — Progress Notes (Signed)
Pre visit review using our clinic review tool, if applicable. No additional management support is needed unless otherwise documented below in the visit note. 

## 2014-05-10 ENCOUNTER — Encounter: Payer: Self-pay | Admitting: *Deleted

## 2014-05-10 LAB — BASIC METABOLIC PANEL
BUN: 31 mg/dL — AB (ref 6–23)
CHLORIDE: 107 meq/L (ref 96–112)
CO2: 27 mEq/L (ref 19–32)
Calcium: 9.5 mg/dL (ref 8.4–10.5)
Creatinine, Ser: 0.88 mg/dL (ref 0.40–1.20)
GFR: 65.93 mL/min (ref >60.00)
Glucose, Bld: 101 mg/dL — ABNORMAL HIGH (ref 70–99)
POTASSIUM: 4.5 meq/L (ref 3.5–5.1)
Sodium: 139 mEq/L (ref 135–145)

## 2014-05-13 NOTE — Op Note (Signed)
PATIENT NAME:  Kathleen Wallace, FETTING MR#:  [id number] DATE OF BIRTH:  1933-05-27  DATE OF PROCEDURE:  11/30/2013  PREPROCEDURE DIAGNOSIS: Left shoulder fracture dislocation.   POSTOPERATIVE DIAGNOSIS: Left shoulder fracture dislocation.   PROCEDURE: Closed reduction left shoulder.   ANESTHESIA:  IV sedation by ER attending.    DESCRIPTION OF PROCEDURE:  After informed consent had been obtained and appropriate patient identification timeout procedure was completed, adequate sedation was given. With the arm held in 90 degrees of abduction longitudinal traction was applied. After adequate traction had been given to allow for lateral movement of the head the shoulder was gently internally rotated and the head could be palpated to rotate in and reduce and the arm could be brought to the side with no crepitation. Postreduction x-rays showed essentially anatomic alignment and the patient was placed in a shoulder immobilizer. There was no blood loss. No complications. No specimen.   The patient was subsequently discharged from the Emergency Room.    ____________________________ Laurene Footman, MD mjm:bu D: 11/30/2013 18:02:36 ET T: 11/30/2013 21:36:34 ET JOB#: [id number]  cc: Laurene Footman, MD, <Dictator> Laurene Footman MD ELECTRONICALLY SIGNED 11/30/2013 23:28

## 2014-05-16 ENCOUNTER — Encounter: Payer: Self-pay | Admitting: Internal Medicine

## 2014-05-21 ENCOUNTER — Encounter: Payer: Self-pay | Admitting: Internal Medicine

## 2014-05-21 NOTE — Assessment & Plan Note (Signed)
On lisinopril.  Blood pressure is doing better.  Outside checks averaging 130-150/60-70.  Improved.  Discussed increasing the dose of lisinopril.  She wants to monitor.  Feels the stress is getting better.  Follow.  Check metabolic panel.

## 2014-05-21 NOTE — Progress Notes (Signed)
[name], female   DOB: 1933-03-06, 81 y.o.   MRN: [id number]   Subjective:    [name], female    DOB: 1933-02-22, 81 y.o.   MRN: [id number]  HPI  Patient here for a scheduled follow up.  She is taking lisinopril.  Tolerating.  Her blood pressure has been doing better.  She feels better.  Shoulder better.  Still with increased stress dealing with her daughter's death.  Overall she feels she is doing better.  No headache or dizziness.  Stays active.  No cardiac symptoms with increased activity or exertion.     Past Medical History  Diagnosis Date  . Elevated blood pressure     Review of Systems  Constitutional: Negative for appetite change and unexpected weight change.  HENT: Negative for congestion and sinus pressure.   Respiratory: Negative for cough, chest tightness and shortness of breath.   Cardiovascular: Negative for chest pain, palpitations and leg swelling.  Gastrointestinal: Negative for nausea, vomiting and abdominal pain.  Neurological: Negative for dizziness, light-headedness and headaches.       Objective:     Blood pressure recheck:  148/72  Physical Exam  Constitutional: She appears well-developed and well-nourished. No distress.  HENT:  Nose: Nose normal.  Mouth/Throat: Oropharynx is clear and moist.  Neck: Neck supple. No thyromegaly present.  Cardiovascular: Normal rate and regular rhythm.   Pulmonary/Chest: Breath sounds normal. No respiratory distress. She has no wheezes.  Abdominal: Soft. Bowel sounds are normal. There is no tenderness.  Musculoskeletal: She exhibits no edema or tenderness.  Lymphadenopathy:    She has no cervical adenopathy.  Psychiatric: She has a normal mood and affect. Her behavior is normal.    BP 128/69 mmHg  Pulse 67  Temp(Src) 98.2 F (36.8 C) (Oral)  Ht 5\' 1"  (1.549 m)  Wt 129 lb 2 oz (58.571 kg)  BMI 24.41 kg/m2  SpO2 95% Wt Readings from Last 3 Encounters:  05/09/14 129 lb 2 oz (58.571  kg)  04/18/14 132 lb (59.875 kg)  11/24/13 129 lb 12 oz (58.854 kg)     Lab Results  Component Value Date   WBC 7.9 04/18/2014   HGB 13.9 04/18/2014   HCT 42.5 04/18/2014   PLT 275 04/18/2014   GLUCOSE 101* 05/09/2014   ALT 17 04/18/2014   AST 22 04/18/2014   NA 139 05/09/2014   K 4.5 05/09/2014   CL 107 05/09/2014   CREATININE 0.88 05/09/2014   BUN 31* 05/09/2014   CO2 27 05/09/2014   TSH 1.51 04/18/2014       Assessment & Plan:   Problem List Items Addressed This Visit    Dizziness    Resolved.  Not an issue for her now.        Essential hypertension - Primary    On lisinopril.  Blood pressure is doing better.  Outside checks averaging 130-150/60-70.  Improved.  Discussed increasing the dose of lisinopril.  She wants to monitor.  Feels the stress is getting better.  Follow.  Check metabolic panel.        Relevant Orders   Basic metabolic panel (Completed)   Stress    Discussed with her today.  Doing better.  Feels better.  Follow.            Einar Pheasant, MD

## 2014-05-21 NOTE — Assessment & Plan Note (Signed)
Discussed with her today.  Doing better.  Feels better.  Follow.

## 2014-05-21 NOTE — Assessment & Plan Note (Signed)
Resolved.  Not an issue for her now.  

## 2014-07-28 ENCOUNTER — Ambulatory Visit: Payer: Medicare Other | Admitting: Internal Medicine

## 2014-11-16 ENCOUNTER — Ambulatory Visit: Payer: Medicare Other | Admitting: Internal Medicine

## 2015-01-02 ENCOUNTER — Encounter: Payer: Medicare Other | Admitting: Internal Medicine

## 2015-06-22 ENCOUNTER — Ambulatory Visit (INDEPENDENT_AMBULATORY_CARE_PROVIDER_SITE_OTHER): Payer: Medicare Other | Admitting: Internal Medicine

## 2015-06-22 ENCOUNTER — Encounter: Payer: Self-pay | Admitting: Internal Medicine

## 2015-06-22 VITALS — BP 130/84 | HR 82 | Temp 98.4°F | Wt 131.0 lb

## 2015-06-22 DIAGNOSIS — Z0000 Encounter for general adult medical examination without abnormal findings: Secondary | ICD-10-CM

## 2015-06-22 DIAGNOSIS — R011 Cardiac murmur, unspecified: Secondary | ICD-10-CM | POA: Diagnosis not present

## 2015-06-22 DIAGNOSIS — Z658 Other specified problems related to psychosocial circumstances: Secondary | ICD-10-CM | POA: Diagnosis not present

## 2015-06-22 DIAGNOSIS — I10 Essential (primary) hypertension: Secondary | ICD-10-CM

## 2015-06-22 DIAGNOSIS — E7800 Pure hypercholesterolemia, unspecified: Secondary | ICD-10-CM | POA: Diagnosis not present

## 2015-06-22 DIAGNOSIS — F439 Reaction to severe stress, unspecified: Secondary | ICD-10-CM

## 2015-06-22 NOTE — Progress Notes (Signed)
Pre visit review using our clinic review tool, if applicable. No additional management support is needed unless otherwise documented below in the visit note. 

## 2015-06-22 NOTE — Progress Notes (Signed)
[name], female   DOB: 05-26-1933, 82 y.o.   MRN: [id number]   Subjective:    [name], female    DOB: Dec 24, 1933, 82 y.o.   MRN: [id number]  HPI  Patient here for her physical exam.  She stays active.  No cardiac symptoms with increased activity or exertion.  No sob.  No acid reflux.  No abdominal pain or cramping.  Bowels doing well.  She is s/p left shoulder fracture.  Saw ortho.  Therapy.  Doing well.  Going to gym.  Exercising.  Handling stress.  Overall she feel she is doing well.     Past Medical History  Diagnosis Date  . Elevated blood pressure    Past Surgical History  Procedure Laterality Date  . Tonsillectomy and adenoidectomy  1944  . Abdominal hysterectomy  1974   Family History  Problem Relation Age of Onset  . Stroke Mother   . Hypertension Mother   . Stroke Father   . Hypertension Father   . Diabetes Other    Social History   Social History  . Marital Status: Married    Spouse Name: N/A  . Number of Children: N/A  . Years of Education: N/A   Social History Main Topics  . Smoking status: Never Smoker   . Smokeless tobacco: Never Used  . Alcohol Use: No  . Drug Use: No  . Sexual Activity: Not Asked   Other Topics Concern  . None   Social History Narrative    Outpatient Encounter Prescriptions as of 06/22/2015  Medication Sig  . albuterol (PROVENTIL HFA;VENTOLIN HFA) 108 (90 BASE) MCG/ACT inhaler Inhale 2 puffs into the lungs every 6 (six) hours as needed for wheezing.  . Ascorbic Acid (VITAMIN C PO) Take by mouth.  Marland Kitchen lisinopril (PRINIVIL,ZESTRIL) 10 MG tablet TAKE ONE TABLET EVERY DAY  . ranitidine (ZANTAC) 150 MG tablet Take 150 mg by mouth daily.   No facility-administered encounter medications on file as of 06/22/2015.    Review of Systems  Constitutional: Negative for appetite change and unexpected weight change.  HENT: Negative for congestion and sinus pressure.   Eyes: Negative for pain and visual  disturbance.  Respiratory: Negative for cough, chest tightness and shortness of breath.   Cardiovascular: Negative for chest pain, palpitations and leg swelling.  Gastrointestinal: Negative for nausea, vomiting, abdominal pain and diarrhea.  Genitourinary: Negative for dysuria and difficulty urinating.  Musculoskeletal: Negative for back pain and joint swelling.  Skin: Negative for color change and rash.  Neurological: Negative for dizziness, light-headedness and headaches.  Hematological: Negative for adenopathy. Does not bruise/bleed easily.  Psychiatric/Behavioral: Negative for dysphoric mood and agitation.       Objective:    Physical Exam  Constitutional: She is oriented to person, place, and time. She appears well-developed and well-nourished. No distress.  HENT:  Nose: Nose normal.  Mouth/Throat: Oropharynx is clear and moist.  Eyes: Right eye exhibits no discharge. Left eye exhibits no discharge. No scleral icterus.  Neck: Neck supple. No thyromegaly present.  Cardiovascular: Normal rate and regular rhythm.   Pulmonary/Chest: Breath sounds normal. No accessory muscle usage. No tachypnea. No respiratory distress. She has no decreased breath sounds. She has no wheezes. She has no rhonchi. Right breast exhibits no inverted nipple, no mass, no nipple discharge and no tenderness (no axillary adenopathy). Left breast exhibits no inverted nipple, no mass, no nipple discharge and no tenderness (no axilarry adenopathy).  Implants in place.  Abdominal: Soft. Bowel sounds are normal. There is no tenderness.  Musculoskeletal: She exhibits no edema or tenderness.  Lymphadenopathy:    She has no cervical adenopathy.  Neurological: She is alert and oriented to person, place, and time.  Skin: Skin is warm. No rash noted. No erythema.  Psychiatric: She has a normal mood and affect. Her behavior is normal.    BP 130/84 mmHg  Pulse 82  Temp(Src) 98.4 F (36.9 C) (Oral)  Wt 131 lb (59.421  kg) Wt Readings from Last 3 Encounters:  06/22/15 131 lb (59.421 kg)  05/09/14 129 lb 2 oz (58.571 kg)  04/18/14 132 lb (59.875 kg)     Lab Results  Component Value Date   WBC 7.9 04/18/2014   HGB 13.9 04/18/2014   HCT 42.5 04/18/2014   PLT 275 04/18/2014   GLUCOSE 101* 05/09/2014   ALT 17 04/18/2014   AST 22 04/18/2014   NA 139 05/09/2014   K 4.5 05/09/2014   CL 107 05/09/2014   CREATININE 0.88 05/09/2014   BUN 31* 05/09/2014   CO2 27 05/09/2014   TSH 1.51 04/18/2014       Assessment & Plan:   Problem List Items Addressed This Visit    Essential hypertension    She is off her medication.  Discussed restarting.  Recheck blood pressure by me:  123XX123 systolic.  States averages 128-148/62-68 at home.  Informed to call if changes mind about starting medication.  Follow.  Check metabolic panel.       Relevant Orders   CBC with Differential/Platelet   TSH   Basic metabolic panel   Health care maintenance    Physical today 06/22/15.  She declines mammogram, colonoscopy.  Discussed cologuard.  She declines.  Will notify me if changes her mind.       Heart murmur    She has declined ECHO.  Currently asymptomatic.  Follow.       Hypercholesterolemia    Low cholesterol diet and exercise.  Follow lipid panel.        Relevant Orders   Lipid panel   Hepatic function panel   Stress    She is doing well.  Feels handling things well.  Follow.         Other Visit Diagnoses    Routine general medical examination at a health care facility    -  Primary        Einar Pheasant, MD

## 2015-06-23 ENCOUNTER — Encounter: Payer: Self-pay | Admitting: Internal Medicine

## 2015-06-23 DIAGNOSIS — Z0000 Encounter for general adult medical examination without abnormal findings: Secondary | ICD-10-CM | POA: Insufficient documentation

## 2015-06-23 NOTE — Assessment & Plan Note (Signed)
She is off her medication.  Discussed restarting.  Recheck blood pressure by me:  123XX123 systolic.  States averages 128-148/62-68 at home.  Informed to call if changes mind about starting medication.  Follow.  Check metabolic panel.

## 2015-06-23 NOTE — Assessment & Plan Note (Signed)
She has declined ECHO.  Currently asymptomatic.  Follow.

## 2015-06-23 NOTE — Assessment & Plan Note (Signed)
She is doing well.  Feels handling things well.  Follow.

## 2015-06-23 NOTE — Assessment & Plan Note (Signed)
Low cholesterol diet and exercise.  Follow lipid panel.   

## 2015-06-23 NOTE — Assessment & Plan Note (Signed)
Physical today 06/22/15.  She declines mammogram, colonoscopy.  Discussed cologuard.  She declines.  Will notify me if changes her mind.

## 2015-07-23 ENCOUNTER — Other Ambulatory Visit (INDEPENDENT_AMBULATORY_CARE_PROVIDER_SITE_OTHER): Payer: Medicare Other

## 2015-07-23 DIAGNOSIS — I10 Essential (primary) hypertension: Secondary | ICD-10-CM | POA: Diagnosis not present

## 2015-07-23 DIAGNOSIS — E7800 Pure hypercholesterolemia, unspecified: Secondary | ICD-10-CM

## 2015-07-23 LAB — HEPATIC FUNCTION PANEL
ALBUMIN: 4.3 g/dL (ref 3.5–5.2)
ALK PHOS: 53 U/L (ref 39–117)
ALT: 15 U/L (ref 0–35)
AST: 15 U/L (ref 0–37)
Bilirubin, Direct: 0 mg/dL (ref 0.0–0.3)
Total Bilirubin: 0.5 mg/dL (ref 0.2–1.2)
Total Protein: 6.9 g/dL (ref 6.0–8.3)

## 2015-07-23 LAB — CBC WITH DIFFERENTIAL/PLATELET
BASOS ABS: 0 10*3/uL (ref 0.0–0.1)
Basophils Relative: 0.4 % (ref 0.0–3.0)
EOS ABS: 0.2 10*3/uL (ref 0.0–0.7)
Eosinophils Relative: 2.5 % (ref 0.0–5.0)
HCT: 38.2 % (ref 36.0–46.0)
HEMOGLOBIN: 13.8 g/dL (ref 12.0–15.0)
LYMPHS ABS: 1.8 10*3/uL (ref 0.7–4.0)
Lymphocytes Relative: 25.6 % (ref 12.0–46.0)
MCHC: 36 g/dL (ref 30.0–36.0)
MCV: 99 fl (ref 78.0–100.0)
MONO ABS: 0.6 10*3/uL (ref 0.1–1.0)
Monocytes Relative: 8.2 % (ref 3.0–12.0)
NEUTROS PCT: 63.3 % (ref 43.0–77.0)
Neutro Abs: 4.5 10*3/uL (ref 1.4–7.7)
Platelets: 273 10*3/uL (ref 150.0–400.0)
RBC: 3.86 Mil/uL — AB (ref 3.87–5.11)
RDW: 14 % (ref 11.5–15.5)
WBC: 7.1 10*3/uL (ref 4.0–10.5)

## 2015-07-23 LAB — LIPID PANEL
CHOLESTEROL: 312 mg/dL — AB (ref 0–200)
HDL: 74.3 mg/dL (ref >39.00)
LDL Cholesterol: 215 mg/dL — ABNORMAL HIGH (ref 0–99)
NONHDL: 237.7
Total CHOL/HDL Ratio: 4
Triglycerides: 113 mg/dL (ref 0.0–149.0)
VLDL: 22.6 mg/dL (ref 0.0–40.0)

## 2015-07-23 LAB — BASIC METABOLIC PANEL
BUN: 19 mg/dL (ref 6–23)
CALCIUM: 9.3 mg/dL (ref 8.4–10.5)
CO2: 26 meq/L (ref 19–32)
CREATININE: 0.85 mg/dL (ref 0.40–1.20)
Chloride: 104 mEq/L (ref 96–112)
GFR: 68.41 mL/min (ref >60.00)
GLUCOSE: 94 mg/dL (ref 70–99)
Potassium: 4.3 mEq/L (ref 3.5–5.1)
SODIUM: 138 meq/L (ref 135–145)

## 2015-07-23 LAB — TSH: TSH: 1.68 u[IU]/mL (ref 0.35–4.50)

## 2016-01-04 ENCOUNTER — Ambulatory Visit: Payer: Medicare Other | Admitting: Internal Medicine

## 2016-01-31 ENCOUNTER — Encounter: Payer: Self-pay | Admitting: Internal Medicine

## 2016-01-31 ENCOUNTER — Ambulatory Visit (INDEPENDENT_AMBULATORY_CARE_PROVIDER_SITE_OTHER): Payer: Medicare HMO | Admitting: Internal Medicine

## 2016-01-31 DIAGNOSIS — R011 Cardiac murmur, unspecified: Secondary | ICD-10-CM | POA: Diagnosis not present

## 2016-01-31 DIAGNOSIS — K219 Gastro-esophageal reflux disease without esophagitis: Secondary | ICD-10-CM | POA: Diagnosis not present

## 2016-01-31 DIAGNOSIS — I10 Essential (primary) hypertension: Secondary | ICD-10-CM

## 2016-01-31 DIAGNOSIS — F439 Reaction to severe stress, unspecified: Secondary | ICD-10-CM | POA: Diagnosis not present

## 2016-01-31 DIAGNOSIS — E7800 Pure hypercholesterolemia, unspecified: Secondary | ICD-10-CM | POA: Diagnosis not present

## 2016-01-31 DIAGNOSIS — E079 Disorder of thyroid, unspecified: Secondary | ICD-10-CM

## 2016-01-31 DIAGNOSIS — R69 Illness, unspecified: Secondary | ICD-10-CM | POA: Diagnosis not present

## 2016-01-31 NOTE — Patient Instructions (Signed)
Thyroid ultrasound - diagnosis - thyroid nodule.

## 2016-01-31 NOTE — Progress Notes (Signed)
Patient ID: Janeece Agee, female   DOB: Jul 29, 1933, 83 y.o.   MRN: [id number]   Subjective:    Patient ID: Janeece Agee, female    DOB: [birth year], 83 y.o.   MRN: [id number]  HPI  Patient here for a scheduled follow up.  States she is doing well.   Stays active.  No chest pain.  No sob.  No acid reflux.  No abdominal pain or cramping.  Bowels stable.  Discussed previous scan and question of thyroid nodule.  Discussed ultrasound.  She wants to hold at this time.  Still working.  Handling stress.     Past Medical History:  Diagnosis Date  . Elevated blood pressure    Past Surgical History:  Procedure Laterality Date  . ABDOMINAL HYSTERECTOMY  1974  . TONSILLECTOMY AND ADENOIDECTOMY  1944   Family History  Problem Relation Age of Onset  . Stroke Mother   . Hypertension Mother   . Stroke Father   . Hypertension Father   . Diabetes Other    Social History   Social History  . Marital status: Married    Spouse name: N/A  . Number of children: N/A  . Years of education: N/A   Social History Main Topics  . Smoking status: Never Smoker  . Smokeless tobacco: Never Used  . Alcohol use No  . Drug use: No  . Sexual activity: Not Asked   Other Topics Concern  . None   Social History Narrative  . None    Outpatient Encounter Prescriptions as of 01/31/2016  Medication Sig  . albuterol (PROVENTIL HFA;VENTOLIN HFA) 108 (90 BASE) MCG/ACT inhaler Inhale 2 puffs into the lungs every 6 (six) hours as needed for wheezing.  . Ascorbic Acid (VITAMIN C PO) Take by mouth.  . ranitidine (ZANTAC) 150 MG tablet Take 150 mg by mouth daily.  Marland Kitchen lisinopril (PRINIVIL,ZESTRIL) 10 MG tablet TAKE ONE TABLET EVERY DAY (Patient not taking: Reported on 01/31/2016)   No facility-administered encounter medications on file as of 01/31/2016.     Review of Systems  Constitutional: Negative for appetite change and unexpected weight change.  HENT: Negative for congestion and sinus pressure.   Respiratory:  Negative for cough, chest tightness and shortness of breath.   Cardiovascular: Negative for chest pain, palpitations and leg swelling.  Gastrointestinal: Negative for abdominal pain, diarrhea, nausea and vomiting.  Genitourinary: Negative for difficulty urinating and dysuria.  Musculoskeletal: Negative for back pain and joint swelling.  Skin: Negative for color change and rash.  Neurological: Negative for dizziness, light-headedness and headaches.  Psychiatric/Behavioral: Negative for agitation and dysphoric mood.       Objective:    Physical Exam  Constitutional: She appears well-developed and well-nourished. No distress.  HENT:  Nose: Nose normal.  Mouth/Throat: Oropharynx is clear and moist.  Neck: Neck supple. No thyromegaly present.  Cardiovascular: Normal rate and regular rhythm.   Pulmonary/Chest: Breath sounds normal. No respiratory distress. She has no wheezes.  Abdominal: Soft. Bowel sounds are normal. There is no tenderness.  Musculoskeletal: She exhibits no edema or tenderness.  Lymphadenopathy:    She has no cervical adenopathy.  Skin: No rash noted. No erythema.  Psychiatric: She has a normal mood and affect. Her behavior is normal.    BP (!) 148/86   Pulse 79   Temp 97.4 F (36.3 C) (Oral)   Ht 5\' 1"  (1.549 m)   Wt 133 lb 9.6 oz (60.6 kg)   SpO2 93%   BMI 25.24  kg/m  Wt Readings from Last 3 Encounters:  01/31/16 133 lb 9.6 oz (60.6 kg)  06/22/15 131 lb (59.4 kg)  05/09/14 129 lb 2 oz (58.6 kg)     Lab Results  Component Value Date   WBC 7.1 07/23/2015   HGB 13.8 07/23/2015   HCT 38.2 07/23/2015   PLT 273.0 07/23/2015   GLUCOSE 94 07/23/2015   CHOL 312 (H) 07/23/2015   TRIG 113.0 07/23/2015   HDL 74.30 07/23/2015   LDLCALC 215 (H) 07/23/2015   ALT 15 07/23/2015   AST 15 07/23/2015   NA 138 07/23/2015   K 4.3 07/23/2015   CL 104 07/23/2015   CREATININE 0.85 07/23/2015   BUN 19 07/23/2015   CO2 26 07/23/2015   TSH 1.68 07/23/2015          Assessment & Plan:   Problem List Items Addressed This Visit    Essential hypertension    Blood pressure elevated.  Discussed medication.  She never started the lisinopril.  States her pressure averages A999333 systolic readings.  Follow.  Followed metabolic panel.  Declines medication.        GERD (gastroesophageal reflux disease)    Zantac.  Controlled.  Follow.        Heart murmur    She has declined echo.  Currently asymptomatic.        Hypercholesterolemia    Low cholesterol diet and exercise.  Follow lipid panel.        Stress    She feels she is doing well.  Follow.        Thyroid mass    Thyroid mass noted on previous scan.  Discussed with her today.  Discussed the need for thyroid ultrasound.  She declines.  Will notify me if she changes her mind.  Follow.            Einar Pheasant, MD

## 2016-01-31 NOTE — Progress Notes (Signed)
Pre visit review using our clinic review tool, if applicable. No additional management support is needed unless otherwise documented below in the visit note. 

## 2016-02-03 ENCOUNTER — Encounter: Payer: Self-pay | Admitting: Internal Medicine

## 2016-02-03 DIAGNOSIS — E079 Disorder of thyroid, unspecified: Secondary | ICD-10-CM | POA: Insufficient documentation

## 2016-02-03 NOTE — Assessment & Plan Note (Signed)
Thyroid mass noted on previous scan.  Discussed with her today.  Discussed the need for thyroid ultrasound.  She declines.  Will notify me if she changes her mind.  Follow.

## 2016-02-03 NOTE — Assessment & Plan Note (Signed)
She has declined echo.  Currently asymptomatic.   

## 2016-02-03 NOTE — Assessment & Plan Note (Signed)
She feels she is doing well.  Follow.

## 2016-02-03 NOTE — Assessment & Plan Note (Signed)
Low cholesterol diet and exercise.  Follow lipid panel.   

## 2016-02-03 NOTE — Assessment & Plan Note (Addendum)
Blood pressure elevated.  Discussed medication.  She never started the lisinopril.  States her pressure averages A999333 systolic readings.  Follow.  Followed metabolic panel.  Declines medication.

## 2016-02-03 NOTE — Assessment & Plan Note (Signed)
Zantac.  Controlled.  Follow.

## 2016-03-11 ENCOUNTER — Telehealth: Payer: Self-pay | Admitting: Internal Medicine

## 2016-03-11 NOTE — Telephone Encounter (Signed)
Pt dropped off insurance forms to be filled from [place] in Dr. Nicki Reaper colored folder up front.. Pt will pick up

## 2016-03-11 NOTE — Telephone Encounter (Signed)
Have put forms in your folder one if for patient one for patient one for husband

## 2016-03-17 NOTE — Telephone Encounter (Signed)
Is this the one that came in for eye test?

## 2016-03-17 NOTE — Telephone Encounter (Signed)
Form completed.  She will need visual acuity checked - see form.  She will also need to sign form.

## 2016-03-17 NOTE — Telephone Encounter (Signed)
[facility] form given to KB

## 2016-03-17 NOTE — Telephone Encounter (Signed)
Please call pt at [phone]

## 2016-03-20 NOTE — Telephone Encounter (Signed)
Patient came in for visual acuity and form completed.

## 2016-05-19 ENCOUNTER — Ambulatory Visit: Payer: Medicare HMO | Admitting: Internal Medicine

## 2016-05-30 ENCOUNTER — Other Ambulatory Visit: Payer: Self-pay | Admitting: Internal Medicine

## 2016-07-18 ENCOUNTER — Telehealth: Payer: Self-pay

## 2016-07-18 NOTE — Telephone Encounter (Signed)
Patient is on the list for [employer] 2018 and may be a good candidate for an AWV. Please let me know if/when appt is scheduled.   

## 2016-07-22 NOTE — Telephone Encounter (Signed)
Scheduled 08/01/16 °

## 2016-07-22 NOTE — Telephone Encounter (Signed)
Left pt message asking to call Ebony Hail back at [phone] to see if pt can extend her appt on 7/13 to include AWV.

## 2016-08-01 ENCOUNTER — Ambulatory Visit (INDEPENDENT_AMBULATORY_CARE_PROVIDER_SITE_OTHER): Payer: Medicare HMO | Admitting: Internal Medicine

## 2016-08-01 ENCOUNTER — Encounter: Payer: Self-pay | Admitting: Internal Medicine

## 2016-08-01 VITALS — BP 160/88 | HR 80 | Temp 98.7°F | Resp 12 | Ht 61.0 in | Wt 131.6 lb

## 2016-08-01 DIAGNOSIS — R011 Cardiac murmur, unspecified: Secondary | ICD-10-CM | POA: Diagnosis not present

## 2016-08-01 DIAGNOSIS — E079 Disorder of thyroid, unspecified: Secondary | ICD-10-CM | POA: Diagnosis not present

## 2016-08-01 DIAGNOSIS — I10 Essential (primary) hypertension: Secondary | ICD-10-CM | POA: Diagnosis not present

## 2016-08-01 DIAGNOSIS — K219 Gastro-esophageal reflux disease without esophagitis: Secondary | ICD-10-CM

## 2016-08-01 DIAGNOSIS — R69 Illness, unspecified: Secondary | ICD-10-CM | POA: Diagnosis not present

## 2016-08-01 DIAGNOSIS — F439 Reaction to severe stress, unspecified: Secondary | ICD-10-CM

## 2016-08-01 DIAGNOSIS — E7800 Pure hypercholesterolemia, unspecified: Secondary | ICD-10-CM | POA: Diagnosis not present

## 2016-08-01 LAB — CBC WITH DIFFERENTIAL/PLATELET
BASOS ABS: 0 10*3/uL (ref 0.0–0.1)
Basophils Relative: 0.5 % (ref 0.0–3.0)
EOS ABS: 0.2 10*3/uL (ref 0.0–0.7)
EOS PCT: 3.5 % (ref 0.0–5.0)
HCT: 40.3 % (ref 36.0–46.0)
HEMOGLOBIN: 13.5 g/dL (ref 12.0–15.0)
Lymphocytes Relative: 31.2 % (ref 12.0–46.0)
Lymphs Abs: 1.9 10*3/uL (ref 0.7–4.0)
MCHC: 33.6 g/dL (ref 30.0–36.0)
MCV: 96.3 fl (ref 78.0–100.0)
MONO ABS: 0.5 10*3/uL (ref 0.1–1.0)
Monocytes Relative: 9 % (ref 3.0–12.0)
NEUTROS PCT: 55.8 % (ref 43.0–77.0)
Neutro Abs: 3.4 10*3/uL (ref 1.4–7.7)
Platelets: 250 10*3/uL (ref 150.0–400.0)
RBC: 4.18 Mil/uL (ref 3.87–5.11)
RDW: 13.4 % (ref 11.5–15.5)
WBC: 6.1 10*3/uL (ref 4.0–10.5)

## 2016-08-01 LAB — COMPREHENSIVE METABOLIC PANEL
ALBUMIN: 4.3 g/dL (ref 3.5–5.2)
ALK PHOS: 49 U/L (ref 39–117)
ALT: 16 U/L (ref 0–35)
AST: 19 U/L (ref 0–37)
BUN: 20 mg/dL (ref 6–23)
CO2: 25 mEq/L (ref 19–32)
CREATININE: 0.84 mg/dL (ref 0.40–1.20)
Calcium: 9.5 mg/dL (ref 8.4–10.5)
Chloride: 105 mEq/L (ref 96–112)
GFR: 69.17 mL/min (ref >60.00)
GLUCOSE: 105 mg/dL — AB (ref 70–99)
Potassium: 4.3 mEq/L (ref 3.5–5.1)
SODIUM: 139 meq/L (ref 135–145)
TOTAL PROTEIN: 6.8 g/dL (ref 6.0–8.3)
Total Bilirubin: 0.6 mg/dL (ref 0.2–1.2)

## 2016-08-01 LAB — LIPID PANEL
Cholesterol: 322 mg/dL — ABNORMAL HIGH (ref 0–200)
HDL: 87.4 mg/dL (ref >39.00)
LDL Cholesterol: 220 mg/dL — ABNORMAL HIGH (ref 0–99)
NONHDL: 234.26
Total CHOL/HDL Ratio: 4
Triglycerides: 73 mg/dL (ref 0.0–149.0)
VLDL: 14.6 mg/dL (ref 0.0–40.0)

## 2016-08-01 LAB — TSH: TSH: 1.39 u[IU]/mL (ref 0.35–4.50)

## 2016-08-01 NOTE — Progress Notes (Signed)
Patient ID: Janeece Agee, female   DOB: 24-Jul-1933, 83 y.o.   MRN: [id number]   Subjective:    Patient ID: Janeece Agee, female    DOB: 11-22-1933, 83 y.o.   MRN: [id number]  HPI  Patient here for a scheduled follow up.  She reports she is doing relatively well.  Last visit, we discussed taking zantac daily to see if symptoms completely resolved.  She states as long as she was taking the zantac regularly, her symptoms resolved.  She took it for a few months.  Stopped and symptoms returned after 2-3 weeks.  We discussed further w/up.  She declined.  Will restart zantac, but declines GI referral for EGD evaluation.  She stays active.  No chest pain.  No sob.  No abdominal pain or cramping.  Bowels moving.  Discussed again with her regarding previous thyroid "nodule" noted on previous scan.  She declines thyroid ultrasound.  States outside blood pressures are averaging 195-093 systolic readings.     Past Medical History:  Diagnosis Date  . Elevated blood pressure    Past Surgical History:  Procedure Laterality Date  . ABDOMINAL HYSTERECTOMY  1974  . TONSILLECTOMY AND ADENOIDECTOMY  1944   Family History  Problem Relation Age of Onset  . Stroke Mother   . Hypertension Mother   . Stroke Father   . Hypertension Father   . Diabetes Other    Social History   Social History  . Marital status: Married    Spouse name: N/A  . Number of children: N/A  . Years of education: N/A   Social History Main Topics  . Smoking status: Never Smoker  . Smokeless tobacco: Never Used  . Alcohol use No  . Drug use: No  . Sexual activity: Not Asked   Other Topics Concern  . None   Social History Narrative  . None    Outpatient Encounter Prescriptions as of 08/01/2016  Medication Sig  . albuterol (PROVENTIL HFA;VENTOLIN HFA) 108 (90 BASE) MCG/ACT inhaler Inhale 2 puffs into the lungs every 6 (six) hours as needed for wheezing.  . Ascorbic Acid (VITAMIN C PO) Take by mouth.  . ranitidine (ZANTAC)  150 MG tablet TAKE ONE TABLET EVERY DAY  . lisinopril (PRINIVIL,ZESTRIL) 10 MG tablet TAKE ONE TABLET EVERY DAY (Patient not taking: Reported on 01/31/2016)   No facility-administered encounter medications on file as of 08/01/2016.     Review of Systems  Constitutional: Negative for appetite change and unexpected weight change.  HENT: Negative for congestion and sinus pressure.   Respiratory: Negative for cough, chest tightness and shortness of breath.   Cardiovascular: Negative for chest pain, palpitations and leg swelling.  Gastrointestinal: Negative for abdominal pain, diarrhea, nausea and vomiting.       Acid reflux as outlined.   Genitourinary: Negative for difficulty urinating and dysuria.  Musculoskeletal: Negative for back pain and joint swelling.  Skin: Negative for color change and rash.  Neurological: Negative for dizziness, light-headedness and headaches.  Psychiatric/Behavioral: Negative for agitation and dysphoric mood.       Objective:    Physical Exam  Constitutional: She appears well-developed and well-nourished. No distress.  HENT:  Nose: Nose normal.  Mouth/Throat: Oropharynx is clear and moist.  Neck: Neck supple. No thyromegaly present.  Cardiovascular: Normal rate and regular rhythm.   Pulmonary/Chest: Breath sounds normal. No respiratory distress. She has no wheezes.  Abdominal: Soft. Bowel sounds are normal. There is no tenderness.  Musculoskeletal: She exhibits no edema  or tenderness.  Lymphadenopathy:    She has no cervical adenopathy.  Skin: No rash noted. No erythema.  Psychiatric: She has a normal mood and affect. Her behavior is normal.    BP (!) 160/88 (BP Location: Left Arm, Patient Position: Sitting, Cuff Size: Normal)   Pulse 80   Temp 98.7 F (37.1 C) (Oral)   Resp 12   Ht 5\' 1"  (1.549 m)   Wt 131 lb 9.6 oz (59.7 kg)   SpO2 96%   BMI 24.87 kg/m  Wt Readings from Last 3 Encounters:  08/01/16 131 lb 9.6 oz (59.7 kg)  01/31/16 133 lb  9.6 oz (60.6 kg)  06/22/15 131 lb (59.4 kg)     Lab Results  Component Value Date   WBC 6.1 08/01/2016   HGB 13.5 08/01/2016   HCT 40.3 08/01/2016   PLT 250.0 08/01/2016   GLUCOSE 105 (H) 08/01/2016   CHOL 322 (H) 08/01/2016   TRIG 73.0 08/01/2016   HDL 87.40 08/01/2016   LDLCALC 220 (H) 08/01/2016   ALT 16 08/01/2016   AST 19 08/01/2016   NA 139 08/01/2016   K 4.3 08/01/2016   CL 105 08/01/2016   CREATININE 0.84 08/01/2016   BUN 20 08/01/2016   CO2 25 08/01/2016   TSH 1.39 08/01/2016       Assessment & Plan:   Problem List Items Addressed This Visit    Essential hypertension    Blood pressure elevated.  Discussed starting medication with her today.  She declines.  Outside blood pressures as outlined.  Follow.        Relevant Orders   CBC with Differential/Platelet (Completed)   Comprehensive metabolic panel (Completed)   TSH (Completed)   GERD (gastroesophageal reflux disease)    Will restart zantac daily.  Discussed further w/up.  Discussed referral to GI for evaluation for EGD.  She declines.  Follow.        Heart murmur    She has declined echo.  Currently asymptomatic.        Hypercholesterolemia - Primary    Low cholesterol diet and exercise.  She desires not to take medication.  Follow lipid panel.        Relevant Orders   Lipid panel (Completed)   Stress    Overall stable.  Follow.       Thyroid mass    Thyroid mass noted on previous scan.  Discussed again with her today.  She declines.  Will notify me if she changes her mind.            Einar Pheasant, MD

## 2016-08-01 NOTE — Progress Notes (Signed)
Pre-visit discussion using our clinic review tool. No additional management support is needed unless otherwise documented below in the visit note.  

## 2016-08-03 ENCOUNTER — Encounter: Payer: Self-pay | Admitting: Internal Medicine

## 2016-08-03 NOTE — Assessment & Plan Note (Signed)
Blood pressure elevated.  Discussed starting medication with her today.  She declines.  Outside blood pressures as outlined.  Follow.

## 2016-08-03 NOTE — Assessment & Plan Note (Signed)
Will restart zantac daily.  Discussed further w/up.  Discussed referral to GI for evaluation for EGD.  She declines.  Follow.

## 2016-08-03 NOTE — Assessment & Plan Note (Signed)
Low cholesterol diet and exercise.  She desires not to take medication.  Follow lipid panel.

## 2016-08-03 NOTE — Assessment & Plan Note (Signed)
She has declined echo.  Currently asymptomatic.   

## 2016-08-03 NOTE — Assessment & Plan Note (Signed)
Thyroid mass noted on previous scan.  Discussed again with her today.  She declines.  Will notify me if she changes her mind.   

## 2016-08-03 NOTE — Assessment & Plan Note (Signed)
Overall stable.  Follow.

## 2016-08-06 ENCOUNTER — Telehealth: Payer: Self-pay | Admitting: Internal Medicine

## 2016-08-06 NOTE — Telephone Encounter (Signed)
Pt called requesting lab results. Please advise, thank you!  Call pt @ [phone]

## 2016-08-06 NOTE — Telephone Encounter (Signed)
See lab results.  

## 2017-02-06 ENCOUNTER — Ambulatory Visit (INDEPENDENT_AMBULATORY_CARE_PROVIDER_SITE_OTHER): Payer: Medicare HMO | Admitting: Internal Medicine

## 2017-02-06 ENCOUNTER — Encounter: Payer: Self-pay | Admitting: Internal Medicine

## 2017-02-06 VITALS — BP 178/78 | HR 75 | Temp 98.4°F | Ht 61.0 in | Wt 135.4 lb

## 2017-02-06 DIAGNOSIS — R69 Illness, unspecified: Secondary | ICD-10-CM | POA: Diagnosis not present

## 2017-02-06 DIAGNOSIS — F439 Reaction to severe stress, unspecified: Secondary | ICD-10-CM | POA: Diagnosis not present

## 2017-02-06 DIAGNOSIS — K219 Gastro-esophageal reflux disease without esophagitis: Secondary | ICD-10-CM | POA: Diagnosis not present

## 2017-02-06 DIAGNOSIS — E7800 Pure hypercholesterolemia, unspecified: Secondary | ICD-10-CM | POA: Diagnosis not present

## 2017-02-06 DIAGNOSIS — R011 Cardiac murmur, unspecified: Secondary | ICD-10-CM | POA: Diagnosis not present

## 2017-02-06 DIAGNOSIS — E079 Disorder of thyroid, unspecified: Secondary | ICD-10-CM | POA: Diagnosis not present

## 2017-02-06 DIAGNOSIS — I10 Essential (primary) hypertension: Secondary | ICD-10-CM | POA: Diagnosis not present

## 2017-02-06 LAB — CBC WITH DIFFERENTIAL/PLATELET
BASOS ABS: 0 10*3/uL (ref 0.0–0.1)
Basophils Relative: 0.6 % (ref 0.0–3.0)
EOS ABS: 0.3 10*3/uL (ref 0.0–0.7)
Eosinophils Relative: 4.3 % (ref 0.0–5.0)
HCT: 40.9 % (ref 36.0–46.0)
HEMOGLOBIN: 13.6 g/dL (ref 12.0–15.0)
Lymphocytes Relative: 29.9 % (ref 12.0–46.0)
Lymphs Abs: 2 10*3/uL (ref 0.7–4.0)
MCHC: 33.3 g/dL (ref 30.0–36.0)
MCV: 95.4 fl (ref 78.0–100.0)
MONO ABS: 0.6 10*3/uL (ref 0.1–1.0)
Monocytes Relative: 9.2 % (ref 3.0–12.0)
Neutro Abs: 3.7 10*3/uL (ref 1.4–7.7)
Neutrophils Relative %: 56 % (ref 43.0–77.0)
Platelets: 268 10*3/uL (ref 150.0–400.0)
RBC: 4.29 Mil/uL (ref 3.87–5.11)
RDW: 13.3 % (ref 11.5–15.5)
WBC: 6.6 10*3/uL (ref 4.0–10.5)

## 2017-02-06 LAB — LIPID PANEL
Cholesterol: 294 mg/dL — ABNORMAL HIGH (ref 0–200)
HDL: 81.4 mg/dL (ref >39.00)
LDL Cholesterol: 199 mg/dL — ABNORMAL HIGH (ref 0–99)
NONHDL: 213.01
Total CHOL/HDL Ratio: 4
Triglycerides: 68 mg/dL (ref 0.0–149.0)
VLDL: 13.6 mg/dL (ref 0.0–40.0)

## 2017-02-06 LAB — COMPREHENSIVE METABOLIC PANEL
ALBUMIN: 4.4 g/dL (ref 3.5–5.2)
ALK PHOS: 54 U/L (ref 39–117)
ALT: 19 U/L (ref 0–35)
AST: 23 U/L (ref 0–37)
BUN: 22 mg/dL (ref 6–23)
CO2: 27 mEq/L (ref 19–32)
CREATININE: 0.82 mg/dL (ref 0.40–1.20)
Calcium: 9.4 mg/dL (ref 8.4–10.5)
Chloride: 104 mEq/L (ref 96–112)
GFR: 71.03 mL/min (ref >60.00)
Glucose, Bld: 110 mg/dL — ABNORMAL HIGH (ref 70–99)
POTASSIUM: 4.6 meq/L (ref 3.5–5.1)
Sodium: 139 mEq/L (ref 135–145)
TOTAL PROTEIN: 6.8 g/dL (ref 6.0–8.3)
Total Bilirubin: 0.8 mg/dL (ref 0.2–1.2)

## 2017-02-06 LAB — TSH: TSH: 1.88 u[IU]/mL (ref 0.35–4.50)

## 2017-02-06 NOTE — Patient Instructions (Signed)
Take zantac 150mg  daily - 30 minutes before breakfast.

## 2017-02-06 NOTE — Progress Notes (Signed)
Pre visit review using our clinic review tool, if applicable. No additional management support is needed unless otherwise documented below in the visit note. 

## 2017-02-06 NOTE — Progress Notes (Signed)
[name], female   DOB: [birth year], 84 y.o.   MRN: [id number]   Subjective:    [name], female    DOB: Dec 15, 1933, 84 y.o.   MRN: [id number]  HPI  Patient here for a scheduled follow up.  She reports she is doing relatively well.  Still working.  Increased stress with some family issues.  Discussed with her today.  She feels she is coping relatively well.  Does not feel needs any further intervention.  No chest pain.  No sob.  No acid reflux.  No abdominal pain.  Bowels moving.  Blood pressure elevated.  She has not been checking at home.  Has been eating more high sodium foods.     Past Medical History:  Diagnosis Date  . Elevated blood pressure    Past Surgical History:  Procedure Laterality Date  . ABDOMINAL HYSTERECTOMY  1974  . TONSILLECTOMY AND ADENOIDECTOMY  1944   Family History  Problem Relation Age of Onset  . Stroke Mother   . Hypertension Mother   . Stroke Father   . Hypertension Father   . Diabetes Other    Social History   Socioeconomic History  . Marital status: Married    Spouse name: None  . Number of children: None  . Years of education: None  . Highest education level: None  Social Needs  . Financial resource strain: None  . Food insecurity - worry: None  . Food insecurity - inability: None  . Transportation needs - medical: None  . Transportation needs - non-medical: None  Occupational History  . None  Tobacco Use  . Smoking status: Never Smoker  . Smokeless tobacco: Never Used  Substance and Sexual Activity  . Alcohol use: No    Alcohol/week: 0.0 oz  . Drug use: No  . Sexual activity: None  Other Topics Concern  . None  Social History Narrative  . None    Outpatient Encounter Medications as of 02/06/2017  Medication Sig  . albuterol (PROVENTIL HFA;VENTOLIN HFA) 108 (90 BASE) MCG/ACT inhaler Inhale 2 puffs into the lungs every 6 (six) hours as needed for wheezing.  . Ascorbic Acid (VITAMIN C PO) Take by  mouth.  Marland Kitchen lisinopril (PRINIVIL,ZESTRIL) 10 MG tablet TAKE ONE TABLET EVERY DAY  . ranitidine (ZANTAC) 150 MG tablet TAKE ONE TABLET EVERY DAY   No facility-administered encounter medications on file as of 02/06/2017.     Review of Systems  Constitutional: Negative for appetite change and unexpected weight change.  HENT: Negative for congestion and sinus pressure.   Respiratory: Negative for cough, chest tightness and shortness of breath.   Cardiovascular: Negative for chest pain, palpitations and leg swelling.  Gastrointestinal: Negative for abdominal pain, diarrhea, nausea and vomiting.  Genitourinary: Negative for difficulty urinating and dysuria.  Musculoskeletal: Negative for back pain and joint swelling.  Skin: Negative for color change and rash.  Neurological: Negative for dizziness, light-headedness and headaches.  Psychiatric/Behavioral: Negative for agitation and dysphoric mood.       Increased stress as outlined.         Objective:     Blood pressure rechecked by me:  170s/72  Physical Exam  Constitutional: She appears well-developed and well-nourished. No distress.  HENT:  Nose: Nose normal.  Mouth/Throat: Oropharynx is clear and moist.  Neck: Neck supple. No thyromegaly present.  Cardiovascular: Normal rate and regular rhythm.  Pulmonary/Chest: Breath sounds normal. No respiratory distress. She has no wheezes.  Abdominal: Soft.  Bowel sounds are normal. There is no tenderness.  Musculoskeletal: She exhibits no edema or tenderness.  Lymphadenopathy:    She has no cervical adenopathy.  Skin: No rash noted. No erythema.  Psychiatric: She has a normal mood and affect. Her behavior is normal.    BP (!) 178/78   Pulse 75   Temp 98.4 F (36.9 C) (Oral)   Ht 5\' 1"  (1.549 m)   Wt 135 lb 6.4 oz (61.4 kg)   SpO2 98%   BMI 25.58 kg/m  Wt Readings from Last 3 Encounters:  02/06/17 135 lb 6.4 oz (61.4 kg)  08/01/16 131 lb 9.6 oz (59.7 kg)  01/31/16 133 lb 9.6 oz  (60.6 kg)     Lab Results  Component Value Date   WBC 6.6 02/06/2017   HGB 13.6 02/06/2017   HCT 40.9 02/06/2017   PLT 268.0 02/06/2017   GLUCOSE 110 (H) 02/06/2017   CHOL 294 (H) 02/06/2017   TRIG 68.0 02/06/2017   HDL 81.40 02/06/2017   LDLCALC 199 (H) 02/06/2017   ALT 19 02/06/2017   AST 23 02/06/2017   NA 139 02/06/2017   K 4.6 02/06/2017   CL 104 02/06/2017   CREATININE 0.82 02/06/2017   BUN 22 02/06/2017   CO2 27 02/06/2017   TSH 1.88 02/06/2017       Assessment & Plan:   Problem List Items Addressed This Visit    Essential hypertension    Blood pressure elevated.  Discussed with her today.  Discussed starting a blood pressure medication.  She never started the lisinopril.  Declines to start.  Discussed risk of untreated high blood pressure.  Discussed risk of stroke, etc.  She declines to start blood pressure medication.  Will spot check her pressure.  Follow. Check metabolic panel.        Relevant Orders   CBC with Differential/Platelet (Completed)   Comprehensive metabolic panel (Completed)   TSH (Completed)   GERD (gastroesophageal reflux disease)    Some acid reflux.  Discussed with her today.  Previous zantac helped.  Not taking regularly.  Will start.  Discussed further testing with possible EGD.  She declines.  Will notify me if she changes her mind.       Heart murmur    She has declined echo.  Currently asymptomatic.        Hypercholesterolemia - Primary    Low cholesterol diet and exercise.  Follow lipid panel.  Declines cholesterol medication.       Relevant Orders   Comprehensive metabolic panel (Completed)   Lipid panel (Completed)   Stress    Increased stress as outlined.  Discussed with her today.  She does not feel needs any further intervention.        Thyroid mass    Thyroid mass noted on previous scan.  Discussed again with her today.  She declines.  Will notify me if she changes her mind.            Einar Pheasant, MD

## 2017-02-08 ENCOUNTER — Other Ambulatory Visit: Payer: Self-pay | Admitting: Internal Medicine

## 2017-02-08 ENCOUNTER — Encounter: Payer: Self-pay | Admitting: Internal Medicine

## 2017-02-08 DIAGNOSIS — R739 Hyperglycemia, unspecified: Secondary | ICD-10-CM

## 2017-02-08 NOTE — Assessment & Plan Note (Signed)
Some acid reflux.  Discussed with her today.  Previous zantac helped.  Not taking regularly.  Will start.  Discussed further testing with possible EGD.  She declines.  Will notify me if she changes her mind.

## 2017-02-08 NOTE — Assessment & Plan Note (Signed)
Thyroid mass noted on previous scan.  Discussed again with her today.  She declines.  Will notify me if she changes her mind.

## 2017-02-08 NOTE — Assessment & Plan Note (Signed)
Blood pressure elevated.  Discussed with her today.  Discussed starting a blood pressure medication.  She never started the lisinopril.  Declines to start.  Discussed risk of untreated high blood pressure.  Discussed risk of stroke, etc.  She declines to start blood pressure medication.  Will spot check her pressure.  Follow. Check metabolic panel.

## 2017-02-08 NOTE — Assessment & Plan Note (Signed)
Low cholesterol diet and exercise.  Follow lipid panel.  Declines cholesterol medication.   

## 2017-02-08 NOTE — Progress Notes (Signed)
Order placed for f/u fasting labs.

## 2017-02-08 NOTE — Assessment & Plan Note (Signed)
She has declined echo.  Currently asymptomatic.

## 2017-02-08 NOTE — Assessment & Plan Note (Signed)
Increased stress as outlined.  Discussed with her today.  She does not feel needs any further intervention.

## 2017-04-20 ENCOUNTER — Other Ambulatory Visit: Payer: Self-pay | Admitting: Internal Medicine

## 2017-05-15 ENCOUNTER — Ambulatory Visit: Payer: Medicare HMO | Admitting: Internal Medicine

## 2017-07-17 ENCOUNTER — Ambulatory Visit: Payer: Medicare HMO | Admitting: Internal Medicine

## 2017-10-27 DIAGNOSIS — R69 Illness, unspecified: Secondary | ICD-10-CM | POA: Diagnosis not present

## 2017-12-01 DIAGNOSIS — H40031 Anatomical narrow angle, right eye: Secondary | ICD-10-CM | POA: Diagnosis not present

## 2017-12-09 DIAGNOSIS — H2513 Age-related nuclear cataract, bilateral: Secondary | ICD-10-CM | POA: Diagnosis not present

## 2017-12-19 ENCOUNTER — Other Ambulatory Visit: Payer: Self-pay | Admitting: Internal Medicine

## 2017-12-25 ENCOUNTER — Ambulatory Visit: Payer: Medicare HMO

## 2017-12-31 ENCOUNTER — Ambulatory Visit (INDEPENDENT_AMBULATORY_CARE_PROVIDER_SITE_OTHER): Payer: Medicare HMO | Admitting: Internal Medicine

## 2017-12-31 ENCOUNTER — Encounter: Payer: Self-pay | Admitting: Internal Medicine

## 2017-12-31 ENCOUNTER — Ambulatory Visit (INDEPENDENT_AMBULATORY_CARE_PROVIDER_SITE_OTHER): Payer: Medicare HMO

## 2017-12-31 ENCOUNTER — Other Ambulatory Visit: Payer: Self-pay

## 2017-12-31 ENCOUNTER — Telehealth: Payer: Self-pay | Admitting: Internal Medicine

## 2017-12-31 VITALS — BP 155/70 | HR 75 | Temp 98.4°F | Resp 15 | Ht 61.0 in | Wt 128.1 lb

## 2017-12-31 DIAGNOSIS — K219 Gastro-esophageal reflux disease without esophagitis: Secondary | ICD-10-CM

## 2017-12-31 DIAGNOSIS — R69 Illness, unspecified: Secondary | ICD-10-CM | POA: Diagnosis not present

## 2017-12-31 DIAGNOSIS — M545 Low back pain, unspecified: Secondary | ICD-10-CM | POA: Insufficient documentation

## 2017-12-31 DIAGNOSIS — I10 Essential (primary) hypertension: Secondary | ICD-10-CM | POA: Diagnosis not present

## 2017-12-31 DIAGNOSIS — F439 Reaction to severe stress, unspecified: Secondary | ICD-10-CM | POA: Diagnosis not present

## 2017-12-31 DIAGNOSIS — M5441 Lumbago with sciatica, right side: Secondary | ICD-10-CM | POA: Diagnosis not present

## 2017-12-31 DIAGNOSIS — R739 Hyperglycemia, unspecified: Secondary | ICD-10-CM

## 2017-12-31 DIAGNOSIS — Z0000 Encounter for general adult medical examination without abnormal findings: Secondary | ICD-10-CM | POA: Diagnosis not present

## 2017-12-31 DIAGNOSIS — E7800 Pure hypercholesterolemia, unspecified: Secondary | ICD-10-CM

## 2017-12-31 LAB — BASIC METABOLIC PANEL
BUN: 18 mg/dL (ref 6–23)
CALCIUM: 9.4 mg/dL (ref 8.4–10.5)
CHLORIDE: 103 meq/L (ref 96–112)
CO2: 27 mEq/L (ref 19–32)
Creatinine, Ser: 0.78 mg/dL (ref 0.40–1.20)
GFR: 75.08 mL/min (ref >60.00)
Glucose, Bld: 98 mg/dL (ref 70–99)
Potassium: 4.7 mEq/L (ref 3.5–5.1)
Sodium: 137 mEq/L (ref 135–145)

## 2017-12-31 LAB — HEPATIC FUNCTION PANEL
ALT: 13 U/L (ref 0–35)
AST: 15 U/L (ref 0–37)
Albumin: 4.6 g/dL (ref 3.5–5.2)
Alkaline Phosphatase: 49 U/L (ref 39–117)
Bilirubin, Direct: 0.1 mg/dL (ref 0.0–0.3)
Total Bilirubin: 0.5 mg/dL (ref 0.2–1.2)
Total Protein: 6.7 g/dL (ref 6.0–8.3)

## 2017-12-31 LAB — HEMOGLOBIN A1C: HEMOGLOBIN A1C: 6 % (ref 4.6–6.5)

## 2017-12-31 LAB — LIPID PANEL
CHOLESTEROL: 303 mg/dL — AB (ref 0–200)
HDL: 66.1 mg/dL (ref >39.00)
LDL Cholesterol: 211 mg/dL — ABNORMAL HIGH (ref 0–99)
NonHDL: 237.07
Total CHOL/HDL Ratio: 5
Triglycerides: 128 mg/dL (ref 0.0–149.0)
VLDL: 25.6 mg/dL (ref 0.0–40.0)

## 2017-12-31 NOTE — Progress Notes (Signed)
Subjective:   Kathleen Wallace is a 84 y.o. female who presents for an Initial Medicare Annual Wellness Visit.  Review of Systems    No ROS.  Medicare Wellness Visit. Additional risk factors are reflected in the social history.  Cardiac Risk Factors include: advanced age (>75men, >72 women);hypertension     Objective:    Today's Vitals   12/31/17 0845  BP: (!) 155/70  Pulse: 75  Resp: 15  Temp: 98.4 F (36.9 C)  TempSrc: Oral  SpO2: 95%  Weight: 128 lb 1.9 oz (58.1 kg)  Height: 5\' 1"  (9.381 m)   Body mass index is 24.21 kg/m.  Advanced Directives 12/31/2017  Does Patient Have a Medical Advance Directive? No  Would patient like information on creating a medical advance directive? No - Patient declined    Current Medications (verified) Outpatient Encounter Medications as of 12/31/2017  Medication Sig  . albuterol (PROVENTIL HFA;VENTOLIN HFA) 108 (90 BASE) MCG/ACT inhaler Inhale 2 puffs into the lungs every 6 (six) hours as needed for wheezing.  . Ascorbic Acid (VITAMIN C PO) Take by mouth.  Marland Kitchen lisinopril (PRINIVIL,ZESTRIL) 10 MG tablet TAKE ONE TABLET EVERY DAY  . ranitidine (ZANTAC) 150 MG tablet TAKE 1 TABLET DAILY  . vitamin C (ASCORBIC ACID) 250 MG tablet Take by mouth.   No facility-administered encounter medications on file as of 12/31/2017.     Allergies (verified) Codeine and Amoxicillin   History: Past Medical History:  Diagnosis Date  . Elevated blood pressure    Past Surgical History:  Procedure Laterality Date  . ABDOMINAL HYSTERECTOMY  1974  . TONSILLECTOMY AND ADENOIDECTOMY  1944   Family History  Problem Relation Age of Onset  . Stroke Mother   . Hypertension Mother   . Stroke Father   . Hypertension Father   . Diabetes Other    Social History   Socioeconomic History  . Marital status: Married    Spouse name: Not on file  . Number of children: Not on file  . Years of education: Not on file  . Highest education level: Not on file    Occupational History  . Not on file  Social Needs  . Financial resource strain: Not hard at all  . Food insecurity:    Worry: Never true    Inability: Never true  . Transportation needs:    Medical: No    Non-medical: No  Tobacco Use  . Smoking status: Never Smoker  . Smokeless tobacco: Never Used  Substance and Sexual Activity  . Alcohol use: No    Alcohol/week: 0.0 standard drinks  . Drug use: No  . Sexual activity: Not on file  Lifestyle  . Physical activity:    Days per week: Not on file    Minutes per session: Not on file  . Stress: Not on file  Relationships  . Social connections:    Talks on phone: Not on file    Gets together: Not on file    Attends religious service: Not on file    Active member of club or organization: Not on file    Attends meetings of clubs or organizations: Not on file    Relationship status: Not on file  Other Topics Concern  . Not on file  Social History Narrative  . Not on file    Tobacco Counseling Counseling given: Not Answered   Clinical Intake:  Pre-visit preparation completed: Yes  Pain : No/denies pain     Diabetes: No  How  often do you need to have someone help you when you read instructions, pamphlets, or other written materials from your doctor or pharmacy?: 1 - Never  Interpreter Needed?: No      Activities of Daily Living In your present state of health, do you have any difficulty performing the following activities: 12/31/2017  Hearing? N  Vision? N  Difficulty concentrating or making decisions? N  Walking or climbing stairs? N  Dressing or bathing? N  Doing errands, shopping? N  Preparing Food and eating ? N  Using the Toilet? N  In the past six months, have you accidently leaked urine? N  Do you have problems with loss of bowel control? N  Managing your Medications? N  Managing your Finances? N  Housekeeping or managing your Housekeeping? N  Some recent data might be hidden     Immunizations  and Health Maintenance  There is no immunization history on file for this patient. Health Maintenance Due  Topic Date Due  . DEXA SCAN  08/17/2000    Patient Care Team: Einar Pheasant, MD as PCP - General (Internal Medicine)  Indicate any recent Medical Services you may have received from other than [facility] providers in the past year (date may be approximate).     Assessment:   This is a routine wellness examination for [facility].    Patient has a note from Dr. Patria Mane, Chiropractic Physician, stating she needs a MRI. Note reads: Right hip joint, no space at the center of the joint.  Grade 1 spondylo.  Pain scale 0-10 rated 8 today. Worsened pain in the last 4 months. Taking aleve and rest as needed. Deferred to pcp for same day appointment.   Received letter and spoke with her pharmacy regarding Zantac 150mg  recall. Medication management for this issue.  Agrees to follow up with primary care physician for all concerns listed above.   Fasting labs completed today.  Health Screenings  Mammogram -2003 Colonoscopy-2003 Glaucoma-none reported Hearing-passes the whisper test Cholesterol-294 Vision-11/2017 Dental-every 6 months  Social  Alcohol intake-no Smoking history-no Smokers in home?no Illicit drug use?no Exercise walking Diet-regular  Safety  Patient feesl safe at home-yes Patient does have smoke detectors at [employer] Patient does wear sunscreen or protective clothing when in direct sunlight-yes Patient does wear seat belt when driving or riding with others-yes  Activities of Daily Living Patient can do their own household chores. Denies needing assistance with: driving, feeding themselves, getting from bed to chair, getting to the toilet, bathing/showering, dressing, managing money, climbing flight of stairs, or preparing meals.   Depression Screen Patient denies losing interest in daily life, feeling hopeless, or crying easily over simple problems.   Fall  Screen Patient denies being afraid of falling or falling in the last year.   Memory Screen Patient denies problems with memory, misplacing items, and is able to balance checkbook/bank accounts.  Patient is alert, normal appearance, oriented to person/place/and time. Correctly identified the president of the [place], recall of 3 objects, and performing simple calculations.  Patient displays appropriate judgement and can read correct time from watch face.   Immunizations The following Immunizations are up to date: Influenza, shingles, pneumonia, and tetanus.   Other Providers Patient Care Team: Einar Pheasant, MD as PCP - General (Internal Medicine)  Hearing/Vision screen Hearing Screening Comments: Patient is able to hear conversational tones without difficulty.  No issues reported.   Vision Screening Comments: Last OV 11/2017 Cataract surgery scheduled 01/2018 Wears glasses  Dietary issues and exercise  activities discussed: Current Exercise Habits: Home exercise routine, Type of exercise: walking, Time (Minutes): 20, Frequency (Times/Week): 5, Weekly Exercise (Minutes/Week): 100, Intensity: Mild  Goals      Patient Stated   . DIET - INCREASE WATER INTAKE (pt-stated)     Stay hydrated      Depression Screen PHQ 2/9 Scores 12/31/2017 02/06/2017 08/01/2016 06/22/2015 08/03/2012 06/06/2012  PHQ - 2 Score 0 0 0 0 0 0    Fall Risk Fall Risk  12/31/2017 02/06/2017 08/01/2016 06/22/2015 08/03/2012  Falls in the past year? 0 No No No No   Cognitive Function:     6CIT Screen 12/31/2017  What Year? 0 points  What month? 0 points  What time? 0 points  Count back from 20 0 points  Months in reverse 0 points  Repeat phrase 0 points  Total Score 0    Screening Tests Health Maintenance  Topic Date Due  . DEXA SCAN  08/17/2000  . INFLUENZA VACCINE  04/20/2018 (Originally 08/20/2017)  . TETANUS/TDAP  01/01/2019 (Originally [birth year]/1954)  . PNA vac Low Risk Adult (1 of 2 - PCV13) 01/01/2019  (Originally 08/17/2000)     Plan:   End of life planning; Advanced aging; Advanced directives discussed.  No HCPOA/Living Will.  Additional information declined at this time.  Return today at 1200 for follow up with pcp.   I have personally reviewed and noted the following in the patient's chart:   . Medical and social history . Use of alcohol, tobacco or illicit drugs  . Current medications and supplements . Functional ability and status . Nutritional status . Physical activity . Advanced directives . List of other physicians . Hospitalizations, surgeries, and ER visits in previous 12 months . Vitals . Screenings to include cognitive, depression, and falls . Referrals and appointments  In addition, I have reviewed and discussed with patient certain preventive protocols, quality metrics, and best practice recommendations. A written personalized care plan for preventive services as well as general preventive health recommendations were provided to patient.     Varney Biles, LPN   29/02/1113    Reviewed above information.  Agree with assessment and plan.    Dr Nicki Reaper

## 2017-12-31 NOTE — Patient Instructions (Addendum)
  Ms. Romick , Thank you for taking time to come for your Medicare Wellness Visit. I appreciate your ongoing commitment to your health goals. Please review the following plan we discussed and let me know if I can assist you in the future.   Return today at 1200 for follow up appt with Dr. Nicki Reaper.   These are the goals we discussed: Goals      Patient Stated   . DIET - INCREASE WATER INTAKE (pt-stated)     Stay hydrated       This is a list of the screening recommended for you and due dates:  Health Maintenance  Topic Date Due  . DEXA scan (bone density measurement)  08/17/2000  . Flu Shot  04/20/2018*  . Tetanus Vaccine  01/01/2019*  . Pneumonia vaccines (1 of 2 - PCV13) 01/01/2019*  *Topic was postponed. The date shown is not the original due date.

## 2017-12-31 NOTE — Telephone Encounter (Signed)
-----   Message from Dia Crawford, LPN sent at 19/91/4445  9:21 AM EST ----- Regarding: MRI Referrel Patient has a note from Dr. Patria Mane, Chiropractic Physician, stating she needs a MRI.  Note reads: Right hip joint, no space at the center of the joint.  Grade 1 spandylo.  Pain scale 0-10 rated 8 today. Worsened pain in the last 4 months. Taking aleve and rest as needed.   Received letter and spoke with her pharmacy regarding Zantac 150mg  recall. Medication management for this issue.

## 2017-12-31 NOTE — Patient Instructions (Signed)
pepcid 20mg - take one tablet 30 minutes before breakfast 

## 2017-12-31 NOTE — Telephone Encounter (Signed)
Pt seen in clinic today.  MRI ordered.  Changed to pepcid.

## 2017-12-31 NOTE — Progress Notes (Signed)
[name], female   DOB: 06-11-1933, 84 y.o.   MRN: [id number]   Subjective:    [name], female    DOB: Sep 11, 1933, 84 y.o.   MRN: [id number]  HPI  Patient here as a work in with concerns regarding lower back and right buttock/right hip pain.  Has been persistent.  Has been seeing a Restaurant manager, fast food.  Recommended MRI.  Had xrays.  Limits her activity.  Has been doing exercises and chiropractic treatments.  No chest pain.  Breathing stable.  Discussed blood pressure elevation.  Discussed taking lisinopril.  Bowels moving.     Past Medical History:  Diagnosis Date  . Elevated blood pressure    Past Surgical History:  Procedure Laterality Date  . ABDOMINAL HYSTERECTOMY  1974  . TONSILLECTOMY AND ADENOIDECTOMY  1944   Family History  Problem Relation Age of Onset  . Stroke Mother   . Hypertension Mother   . Stroke Father   . Hypertension Father   . Diabetes Other    Social History   Socioeconomic History  . Marital status: Married    Spouse name: Not on file  . Number of children: Not on file  . Years of education: Not on file  . Highest education level: Not on file  Occupational History  . Not on file  Social Needs  . Financial resource strain: Not hard at all  . Food insecurity:    Worry: Never true    Inability: Never true  . Transportation needs:    Medical: No    Non-medical: No  Tobacco Use  . Smoking status: Never Smoker  . Smokeless tobacco: Never Used  Substance and Sexual Activity  . Alcohol use: No    Alcohol/week: 0.0 standard drinks  . Drug use: No  . Sexual activity: Not on file  Lifestyle  . Physical activity:    Days per week: Not on file    Minutes per session: Not on file  . Stress: Not on file  Relationships  . Social connections:    Talks on phone: Not on file    Gets together: Not on file    Attends religious service: Not on file    Active member of club or organization: Not on file    Attends meetings of clubs  or organizations: Not on file    Relationship status: Not on file  Other Topics Concern  . Not on file  Social History Narrative  . Not on file    Outpatient Encounter Medications as of 12/31/2017  Medication Sig  . ranitidine (ZANTAC) 150 MG tablet TAKE 1 TABLET DAILY  . albuterol (PROVENTIL HFA;VENTOLIN HFA) 108 (90 BASE) MCG/ACT inhaler Inhale 2 puffs into the lungs every 6 (six) hours as needed for wheezing.  . Ascorbic Acid (VITAMIN C PO) Take by mouth.  Marland Kitchen lisinopril (PRINIVIL,ZESTRIL) 10 MG tablet TAKE ONE TABLET EVERY DAY (Patient not taking: Reported on 12/31/2017)  . vitamin C (ASCORBIC ACID) 250 MG tablet Take by mouth.   No facility-administered encounter medications on file as of 12/31/2017.     Review of Systems  Constitutional: Negative for appetite change and unexpected weight change.  Respiratory: Negative for chest tightness and shortness of breath.   Cardiovascular: Negative for chest pain and leg swelling.  Gastrointestinal: Negative for diarrhea, nausea and vomiting.  Musculoskeletal: Positive for back pain. Negative for joint swelling.       Right buttock/right hip pain.   Psychiatric/Behavioral: Negative for agitation  and dysphoric mood.       Objective:    Physical Exam Constitutional:      General: She is not in acute distress.    Appearance: Normal appearance.  Neck:     Musculoskeletal: Neck supple. No muscular tenderness.     Thyroid: No thyromegaly.  Cardiovascular:     Rate and Rhythm: Normal rate and regular rhythm.  Pulmonary:     Effort: No respiratory distress.     Breath sounds: Normal breath sounds. No wheezing.  Abdominal:     General: Bowel sounds are normal.     Palpations: Abdomen is soft.     Tenderness: There is no abdominal tenderness.  Musculoskeletal:        General: No swelling or tenderness.     Comments: Increased pain - low back - SLR.  No motor weakness.    Lymphadenopathy:     Cervical: No cervical adenopathy.    Skin:    Findings: No erythema or rash.  Psychiatric:        Behavior: Behavior normal.        Thought Content: Thought content normal.     BP (!) 152/72 (BP Location: Left Arm, Cuff Size: Normal)   Pulse 82   Temp 97.6 F (36.4 C)   Wt 131 lb 9.6 oz (59.7 kg)   SpO2 95%   BMI 24.87 kg/m  Wt Readings from Last 3 Encounters:  12/31/17 131 lb 9.6 oz (59.7 kg)  12/31/17 128 lb 1.9 oz (58.1 kg)  02/06/17 135 lb 6.4 oz (61.4 kg)     Lab Results  Component Value Date   WBC 6.6 02/06/2017   HGB 13.6 02/06/2017   HCT 40.9 02/06/2017   PLT 268.0 02/06/2017   GLUCOSE 98 12/31/2017   CHOL 303 (H) 12/31/2017   TRIG 128.0 12/31/2017   HDL 66.10 12/31/2017   LDLCALC 211 (H) 12/31/2017   ALT 13 12/31/2017   AST 15 12/31/2017   NA 137 12/31/2017   K 4.7 12/31/2017   CL 103 12/31/2017   CREATININE 0.78 12/31/2017   BUN 18 12/31/2017   CO2 27 12/31/2017   TSH 1.88 02/06/2017   HGBA1C 6.0 12/31/2017       Assessment & Plan:   Problem List Items Addressed This Visit    Essential hypertension    Discussed with her today.  Discussed lisinopril.  She has declined blood pressure medication.  Follow metabolic panel.        GERD (gastroesophageal reflux disease)    Stop zantac and start pepcid as outlined.  Follow.        Hypercholesterolemia    Declines cholesterol medication.  Low cholesterol diet and exercise.  Follow lipid panel.        Low back pain    Low back pain, right buttock pain/right hip pain.  Persistent.  Has seen a chiropractor.  Has been doing exercises. Persistent pain and limitation in activity.  Check L-S spine MRI.        Relevant Orders   MR Lumbar Spine Wo Contrast   Stress    Does not feel needs any further intervention.  Follow.            Einar Pheasant, MD

## 2018-01-05 ENCOUNTER — Telehealth: Payer: Self-pay | Admitting: Internal Medicine

## 2018-01-05 NOTE — Telephone Encounter (Signed)
Copied from [place] [id number]. Topic: General - Other >> Jan 05, 2018  3:29 PM Oneta Rack wrote:  Relation to pt: self  Call back number: [phone] [facility]) Pharmacy: [place], [place] - [place] [phone] (Phone) [phone] (Fax)    Reason for call:  As per [facility] ranitidine (ZANTAC) 150 MG tablet  medication is on recall due to "carcinogen"  being detected, therefore patient requesting alternate, please advise

## 2018-01-05 NOTE — Telephone Encounter (Signed)
Advised patient to use pepcid 20 mg same directions.

## 2018-01-06 DIAGNOSIS — Z825 Family history of asthma and other chronic lower respiratory diseases: Secondary | ICD-10-CM | POA: Diagnosis not present

## 2018-01-06 DIAGNOSIS — Z885 Allergy status to narcotic agent status: Secondary | ICD-10-CM | POA: Diagnosis not present

## 2018-01-06 DIAGNOSIS — Z833 Family history of diabetes mellitus: Secondary | ICD-10-CM | POA: Diagnosis not present

## 2018-01-06 DIAGNOSIS — Z809 Family history of malignant neoplasm, unspecified: Secondary | ICD-10-CM | POA: Diagnosis not present

## 2018-01-06 DIAGNOSIS — R030 Elevated blood-pressure reading, without diagnosis of hypertension: Secondary | ICD-10-CM | POA: Diagnosis not present

## 2018-01-06 DIAGNOSIS — Z8249 Family history of ischemic heart disease and other diseases of the circulatory system: Secondary | ICD-10-CM | POA: Diagnosis not present

## 2018-01-08 ENCOUNTER — Telehealth: Payer: Self-pay | Admitting: *Deleted

## 2018-01-08 NOTE — Telephone Encounter (Signed)
Left detailed message for patient.

## 2018-01-08 NOTE — Telephone Encounter (Signed)
Copied from [place] [id number]. Topic: General - Other >> Jan 08, 2018 10:39 AM Virl Axe D wrote: Reason for CRM: Pt called to inquire about MRI that Dr. Nicki Reaper was supposed to be scheduling for her. Stated she had not heard anything since OV on 12/31/17. Pt was informed of MRI scheduled for 01/27/18. Stated she did not know that had been scheduled. She would like to know if there are special instructions for that date. Please advise

## 2018-01-09 ENCOUNTER — Encounter: Payer: Self-pay | Admitting: Internal Medicine

## 2018-01-09 NOTE — Assessment & Plan Note (Signed)
Low back pain, right buttock pain/right hip pain.  Persistent.  Has seen a chiropractor.  Has been doing exercises. Persistent pain and limitation in activity.  Check L-S spine MRI.

## 2018-01-09 NOTE — Assessment & Plan Note (Signed)
Declines cholesterol medication.  Low cholesterol diet and exercise.  Follow lipid panel.   

## 2018-01-09 NOTE — Assessment & Plan Note (Signed)
Discussed with her today.  Discussed lisinopril.  She has declined blood pressure medication.  Follow metabolic panel.

## 2018-01-09 NOTE — Assessment & Plan Note (Signed)
Does not feel needs any further intervention.  Follow.   

## 2018-01-09 NOTE — Assessment & Plan Note (Signed)
Stop zantac and start pepcid as outlined.  Follow.

## 2018-01-27 ENCOUNTER — Ambulatory Visit
Admission: RE | Admit: 2018-01-27 | Discharge: 2018-01-27 | Disposition: A | Discharge status: Home / Self Care | Payer: Medicare HMO | Source: Ambulatory Visit | Attending: Internal Medicine | Admitting: Internal Medicine

## 2018-01-27 DIAGNOSIS — M5126 Other intervertebral disc displacement, lumbar region: Secondary | ICD-10-CM | POA: Diagnosis not present

## 2018-01-27 DIAGNOSIS — M47816 Spondylosis without myelopathy or radiculopathy, lumbar region: Secondary | ICD-10-CM | POA: Diagnosis not present

## 2018-01-27 DIAGNOSIS — M4807 Spinal stenosis, lumbosacral region: Secondary | ICD-10-CM | POA: Diagnosis not present

## 2018-01-27 DIAGNOSIS — M5441 Lumbago with sciatica, right side: Secondary | ICD-10-CM | POA: Diagnosis not present

## 2018-01-28 NOTE — Telephone Encounter (Signed)
Copied from [place] [phone]. Topic: General - Inquiry >> Jan 28, 2018  1:25 PM Conception Chancy, NT wrote: Reason for CRM: patient requesting a call back from directly [place].

## 2018-01-29 NOTE — Telephone Encounter (Signed)
LMTCB

## 2018-01-29 NOTE — Telephone Encounter (Signed)
Patient would like a follow up call regarding  PCP imaging notes concerning referral to a neurosurgeon.  Please note,  patient scheduled for cataract surgery with Dr. Kenton Kingfisher / [facility] for 02/16/2018.  Please call patient best # [phone] home, [phone] mobile or spouse # Lissa Merlin [phone], patient will be awaiting a call back.   *Notes recorded by Einar Pheasant, MD on 01/29/2018 at 5:23 AM EST Dr Meade Maw and Dr Deetta Perla - ([facility] that see pts at [facility]). Also, Dr Arnoldo Morale and Dr Vertell Limber in [place]. Just let me know.

## 2018-01-30 ENCOUNTER — Other Ambulatory Visit: Payer: Self-pay | Admitting: Internal Medicine

## 2018-01-30 DIAGNOSIS — M5441 Lumbago with sciatica, right side: Secondary | ICD-10-CM

## 2018-01-30 NOTE — Progress Notes (Signed)
Order placed for neurosurgery referral 

## 2018-02-05 DIAGNOSIS — H2511 Age-related nuclear cataract, right eye: Secondary | ICD-10-CM | POA: Diagnosis not present

## 2018-02-09 DIAGNOSIS — M5416 Radiculopathy, lumbar region: Secondary | ICD-10-CM | POA: Diagnosis not present

## 2018-02-09 DIAGNOSIS — M47816 Spondylosis without myelopathy or radiculopathy, lumbar region: Secondary | ICD-10-CM | POA: Diagnosis not present

## 2018-02-09 DIAGNOSIS — M48061 Spinal stenosis, lumbar region without neurogenic claudication: Secondary | ICD-10-CM | POA: Diagnosis not present

## 2018-02-09 DIAGNOSIS — M4316 Spondylolisthesis, lumbar region: Secondary | ICD-10-CM | POA: Diagnosis not present

## 2018-02-10 ENCOUNTER — Encounter: Payer: Self-pay | Admitting: *Deleted

## 2018-02-10 NOTE — Patient Instructions (Signed)
Your procedure is scheduled on: 02/16/18 Report to Day Surgery.medical mall second floor To find out your arrival time please call [phone] between 1PM - 3PM on 02/15/18.  Remember: Instructions that are not followed completely may result in serious medical risk,  up to and including death, or upon the discretion of your surgeon and anesthesiologist your  surgery may need to be rescheduled.     _X__ 1. Do not eat food after midnight the night before your procedure.                 No gum chewing or hard candies. You may drink clear liquids up to 2 hours                 before you are scheduled to arrive for your surgery- DO not drink clear                 liquids within 2 hours of the start of your surgery.                 Clear Liquids include:  water, apple juice without pulp, clear carbohydrate                 drink such as Clearfast of Gatorade, Black Coffee or Tea (Do not add                 anything to coffee or tea).  __X__2.  On the morning of surgery brush your teeth with toothpaste and water, you                may rinse your mouth with mouthwash if you wish.  Do not swallow any toothpaste of mouthwash.     _X__ 3.  No Alcohol for 24 hours before or after surgery.   _X__ 4.  Do Not Smoke or use e-cigarettes For 24 Hours Prior to Your Surgery.                 Do not use any chewable tobacco products for at least 6 hours prior to                 surgery.  ____  5.  Bring all medications with you on the day of surgery if instructed.   ____  6.  Notify your doctor if there is any change in your medical condition      (cold, fever, infections).     Do not wear jewelry, make-up, hairpins, clips or nail polish. Do not wear lotions, powders, or perfumes. You may wear deodorant. Do not shave 48 hours prior to surgery. Men may shave face and neck. Do not bring valuables to the hospital.    [facility] is not responsible for any belongings or  valuables.  Contacts, dentures or bridgework may not be worn into surgery. Leave your suitcase in the car. After surgery it may be brought to your room. For patients admitted to the hospital, discharge time is determined by your treatment team.   Patients discharged the day of surgery will not be allowed to drive home.   Please read over the following fact sheets that you were given:             ____ Take these medicines the morning of surgery with A SIP OF WATER:    1.  2.   3.   4.  5.  6.  ____ Fleet Enema (as directed)   ____ Use CHG Soap as  directed  ____ Use inhalers on the day of surgery  ____ Stop metformin 2 days prior to surgery    ____ Take 1/2 of usual insulin dose the night before surgery. No insulin the morning          of surgery.   ____ Stop Coumadin/Plavix/aspirin on   ____ Stop Anti-inflammatories on    ____ Stop supplements until after surgery.    ____ Bring C-Pap to the hospital.

## 2018-02-16 ENCOUNTER — Encounter: Payer: Self-pay | Admitting: Anesthesiology

## 2018-02-16 ENCOUNTER — Ambulatory Visit: Payer: Medicare HMO | Admitting: Anesthesiology

## 2018-02-16 ENCOUNTER — Ambulatory Visit
Admission: RE | Admit: 2018-02-16 | Discharge: 2018-02-16 | Disposition: A | Discharge status: Home / Self Care | Payer: Medicare HMO | Attending: Ophthalmology | Admitting: Ophthalmology

## 2018-02-16 ENCOUNTER — Other Ambulatory Visit: Payer: Self-pay

## 2018-02-16 ENCOUNTER — Encounter
Admission: RE | Disposition: A | Discharge status: Home / Self Care | Payer: Self-pay | Source: Home / Self Care | Attending: Ophthalmology

## 2018-02-16 DIAGNOSIS — E7800 Pure hypercholesterolemia, unspecified: Secondary | ICD-10-CM | POA: Diagnosis not present

## 2018-02-16 DIAGNOSIS — I10 Essential (primary) hypertension: Secondary | ICD-10-CM | POA: Diagnosis not present

## 2018-02-16 DIAGNOSIS — H2511 Age-related nuclear cataract, right eye: Secondary | ICD-10-CM | POA: Insufficient documentation

## 2018-02-16 DIAGNOSIS — K219 Gastro-esophageal reflux disease without esophagitis: Secondary | ICD-10-CM | POA: Diagnosis not present

## 2018-02-16 HISTORY — DX: Cardiac murmur, unspecified: R01.1

## 2018-02-16 HISTORY — PX: CATARACT EXTRACTION W/PHACO: SHX586

## 2018-02-16 SURGERY — PHACOEMULSIFICATION, CATARACT, WITH IOL INSERTION
Anesthesia: Monitor Anesthesia Care | Site: Eye | Laterality: Right

## 2018-02-16 MED ORDER — NA HYALUR & NA CHOND-NA HYALUR 0.55-0.5 ML IO KIT
PACK | INTRAOCULAR | Status: DC | PRN
  Administered 2018-02-16: 1 via OPHTHALMIC

## 2018-02-16 MED ORDER — TETRACAINE HCL 0.5 % OP SOLN
1.0000 [drp] | OPHTHALMIC | Status: AC | PRN
  Administered 2018-02-16 (×3): 1 [drp] via OPHTHALMIC

## 2018-02-16 MED ORDER — POVIDONE-IODINE 5 % OP SOLN
OPHTHALMIC | Status: AC
  Filled 2018-02-16: qty 60

## 2018-02-16 MED ORDER — EPINEPHRINE PF 1 MG/ML IJ SOLN
INTRAOCULAR | Status: DC | PRN
  Administered 2018-02-16: 1 mL via OPHTHALMIC

## 2018-02-16 MED ORDER — MIDAZOLAM HCL 2 MG/2ML IJ SOLN
INTRAMUSCULAR | Status: AC
  Filled 2018-02-16: qty 2

## 2018-02-16 MED ORDER — FENTANYL CITRATE (PF) 100 MCG/2ML IJ SOLN
INTRAMUSCULAR | Status: AC
  Filled 2018-02-16: qty 2

## 2018-02-16 MED ORDER — SODIUM CHLORIDE 0.9 % IV SOLN
INTRAVENOUS | Status: DC
  Administered 2018-02-16: 08:00:00 via INTRAVENOUS

## 2018-02-16 MED ORDER — NA CHONDROIT SULF-NA HYALURON 40-17 MG/ML IO SOLN
INTRAOCULAR | Status: AC
  Filled 2018-02-16: qty 1

## 2018-02-16 MED ORDER — CARBACHOL 0.01 % IO SOLN
INTRAOCULAR | Status: DC | PRN
  Administered 2018-02-16: .5 mL via INTRAOCULAR

## 2018-02-16 MED ORDER — LIDOCAINE HCL (PF) 4 % IJ SOLN
INTRAMUSCULAR | Status: AC
  Filled 2018-02-16: qty 5

## 2018-02-16 MED ORDER — MOXIFLOXACIN HCL 0.5 % OP SOLN: 1.0000 [drp] | OPHTHALMIC | Status: DC | PRN

## 2018-02-16 MED ORDER — MIDAZOLAM HCL 2 MG/2ML IJ SOLN
INTRAMUSCULAR | Status: DC | PRN
  Administered 2018-02-16 (×2): 0.5 mg via INTRAVENOUS

## 2018-02-16 MED ORDER — ONDANSETRON HCL 4 MG/2ML IJ SOLN
INTRAMUSCULAR | Status: AC
  Filled 2018-02-16: qty 2

## 2018-02-16 MED ORDER — FENTANYL CITRATE (PF) 100 MCG/2ML IJ SOLN
INTRAMUSCULAR | Status: DC | PRN
  Administered 2018-02-16 (×2): 25 ug via INTRAVENOUS

## 2018-02-16 MED ORDER — EPINEPHRINE PF 1 MG/ML IJ SOLN
INTRAMUSCULAR | Status: AC
  Filled 2018-02-16: qty 1

## 2018-02-16 MED ORDER — ARMC OPHTHALMIC DILATING DROPS
1.0000 "application " | OPHTHALMIC | Status: AC
  Administered 2018-02-16 (×3): 1 via OPHTHALMIC

## 2018-02-16 MED ORDER — POVIDONE-IODINE 5 % OP SOLN
OPHTHALMIC | Status: DC | PRN
  Administered 2018-02-16: 1 via OPHTHALMIC

## 2018-02-16 MED ORDER — ARMC OPHTHALMIC DILATING DROPS
OPHTHALMIC | Status: AC
  Administered 2018-02-16: 1 via OPHTHALMIC
  Filled 2018-02-16: qty 0.5

## 2018-02-16 MED ORDER — ONDANSETRON HCL 4 MG/2ML IJ SOLN
INTRAMUSCULAR | Status: DC | PRN
  Administered 2018-02-16: 4 mg via INTRAVENOUS

## 2018-02-16 MED ORDER — MOXIFLOXACIN HCL 0.5 % OP SOLN
OPHTHALMIC | Status: DC | PRN
  Administered 2018-02-16: .2 mL via OPHTHALMIC

## 2018-02-16 MED ORDER — TRYPAN BLUE 0.06 % OP SOLN
OPHTHALMIC | Status: DC | PRN
  Administered 2018-02-16: .5 mL via INTRAOCULAR

## 2018-02-16 MED ORDER — LIDOCAINE HCL (PF) 4 % IJ SOLN
INTRAOCULAR | Status: DC | PRN
  Administered 2018-02-16: 2 mL via OPHTHALMIC

## 2018-02-16 MED ORDER — TETRACAINE HCL 0.5 % OP SOLN
OPHTHALMIC | Status: AC
  Administered 2018-02-16: 1 [drp] via OPHTHALMIC
  Filled 2018-02-16: qty 4

## 2018-02-16 MED ORDER — NA CHONDROIT SULF-NA HYALURON 40-17 MG/ML IO SOLN
INTRAOCULAR | Status: DC | PRN
  Administered 2018-02-16: 1 mL via INTRAOCULAR

## 2018-02-16 MED ORDER — NA HYALUR & NA CHOND-NA HYALUR 0.55-0.5 ML IO KIT
PACK | INTRAOCULAR | Status: AC
  Filled 2018-02-16: qty 1.05

## 2018-02-16 MED ORDER — MOXIFLOXACIN HCL 0.5 % OP SOLN
OPHTHALMIC | Status: AC
  Filled 2018-02-16: qty 3

## 2018-02-16 SURGICAL SUPPLY — 17 items
DISSECTOR HYDRO NUCLEUS 50X22 (MISCELLANEOUS) ×8 IMPLANT
GLOVE BIOGEL M 6.5 STRL (GLOVE) ×2 IMPLANT
GOWN STRL REUS W/ TWL LRG LVL3 (GOWN DISPOSABLE) ×1 IMPLANT
GOWN STRL REUS W/ TWL XL LVL3 (GOWN DISPOSABLE) ×1 IMPLANT
GOWN STRL REUS W/TWL LRG LVL3 (GOWN DISPOSABLE) ×1
GOWN STRL REUS W/TWL XL LVL3 (GOWN DISPOSABLE) ×1
KNIFE 45D UP 2.3 (MISCELLANEOUS) ×2 IMPLANT
LABEL CATARACT MEDS ST (LABEL) ×2 IMPLANT
LENS IOL ACRYSOF IQ 21.0 (Intraocular Lens) ×2 IMPLANT
PACK CATARACT (MISCELLANEOUS) ×2 IMPLANT
PACK CATARACT KING (MISCELLANEOUS) ×2 IMPLANT
PACK EYE AFTER SURG (MISCELLANEOUS) ×2 IMPLANT
SOL BSS BAG (MISCELLANEOUS) ×2
SOLUTION BSS BAG (MISCELLANEOUS) ×1 IMPLANT
SYR 5ML LL (SYRINGE) ×2 IMPLANT
WATER STERILE IRR 250ML POUR (IV SOLUTION) ×2 IMPLANT
WIPE NON LINTING 3.25X3.25 (MISCELLANEOUS) ×2 IMPLANT

## 2018-02-16 NOTE — Anesthesia Post-op Follow-up Note (Signed)
Anesthesia QCDR form completed.        

## 2018-02-16 NOTE — Anesthesia Postprocedure Evaluation (Signed)
Anesthesia Post Note  Patient: Kathleen Wallace  Procedure(s) Performed: CATARACT EXTRACTION PHACO AND INTRAOCULAR LENS PLACEMENT (IOC) RIGHT (Right Eye)  Patient location during evaluation: PACU Anesthesia Type: MAC Level of consciousness: awake and alert Pain management: pain level controlled Vital Signs Assessment: post-procedure vital signs reviewed and stable Respiratory status: spontaneous breathing, nonlabored ventilation, respiratory function stable and patient connected to nasal cannula oxygen Cardiovascular status: stable and blood pressure returned to baseline Postop Assessment: no apparent nausea or vomiting Anesthetic complications: no     Last Vitals:  Vitals:   02/16/18 0818 02/16/18 1058  BP:  124/87  Pulse: 83 66  Resp: 18 16  Temp: (!) 36.3 C 36.6 C  SpO2: 100% 100%    Last Pain:  Vitals:   02/16/18 1058  TempSrc: Temporal  PainSc: 0-No pain                 Karenann Mcgrory S

## 2018-02-16 NOTE — Op Note (Signed)
  PREOPERATIVE DIAGNOSIS:  Nuclear sclerotic cataract of the RIGHT eye.   POSTOPERATIVE DIAGNOSIS:  Nuclear sclerotic cataract of the RIGHT eye.   OPERATIVE PROCEDURE: Cataract surgery OD   SURGEON:  Marchia Meiers, MD.   ANESTHESIA:  Anesthesiologist: Gunnar Bulla, MD CRNA: Lavone Orn, CRNA  1.      Managed anesthesia care. 2.     0.33ml of Shugarcaine was instilled following the paracentesis   COMPLICATIONS:  None.   TECHNIQUE:   Divide and conquer   DESCRIPTION OF PROCEDURE:  The patient was examined and consented in the preoperative holding area where the aforementioned topical anesthesia was applied to the RIGHT eye and then brought back to the Operating Room where the left eye was prepped and draped in the usual sterile ophthalmic fashion and a lid speculum was placed. A paracentesis was created with the side port blade, the anterior chamber was washed out with trypan blue to stain the anterior capsule, and the anterior chamber was filled with viscoelastic. A near clear corneal incision was performed with the steel keratome. A continuous curvilinear capsulorrhexis was performed with a cystotome followed by the capsulorrhexis forceps. Hydrodissection and hydrodelineation were carried out with BSS on a blunt cannula. The lens was removed in a divide and conquer  technique and the remaining cortical material was removed with the irrigation-aspiration handpiece. The capsular bag was inflated with viscoelastic and the lens was placed in the capsular bag without complication. The remaining viscoelastic was removed from the eye with the irrigation-aspiration handpiece. The wounds were hydrated. The anterior chamber was flushed and the eye was inflated to physiologic pressure. 0.57ml Vigamox was placed in the anterior chamber. The wounds were found to be water tight. The eye was dressed with Vigamox. The patient was given protective glasses to wear throughout the day and a shield with  which to sleep tonight. The patient was also given drops with which to begin a drop regimen today and will follow-up with me in one day. Implant Name Type Inv. Item Serial No. Manufacturer Lot No. LRB No. Used  LENS IOL ACRYSOF IQ 21.0 - [id number] Intraocular Lens LENS IOL ACRYSOF IQ 21.0 [id number] 100 ALCON  Right 1    Procedure(s) with comments: CATARACT EXTRACTION PHACO AND INTRAOCULAR LENS PLACEMENT (IOC) RIGHT (Right) - [place]  01:10 CDE 11.02 Fluid pack lot # [id number]  Electronically signed: Marchia Meiers 02/16/2018 10:52 AM

## 2018-02-16 NOTE — H&P (Signed)
   I have reviewed the patient's H&P and agree with its findings. There have been no interval changes.  Cricket Goodlin MD Ophthalmology 

## 2018-02-16 NOTE — Anesthesia Preprocedure Evaluation (Signed)
Anesthesia Evaluation  Patient identified by MRN, date of birth, ID band Patient awake    Reviewed: Allergy & Precautions, NPO status , Patient's Chart, lab work & pertinent test results, reviewed documented beta blocker date and time   Airway Mallampati: II  TM Distance: >3 FB     Dental  (+) Chipped   Pulmonary           Cardiovascular hypertension, + Valvular Problems/Murmurs      Neuro/Psych    GI/Hepatic GERD  Controlled,  Endo/Other    Renal/GU      Musculoskeletal   Abdominal   Peds  Hematology   Anesthesia Other Findings   Reproductive/Obstetrics                             Anesthesia Physical Anesthesia Plan  ASA: III  Anesthesia Plan: MAC   Post-op Pain Management:    Induction:   PONV Risk Score and Plan:   Airway Management Planned:   Additional Equipment:   Intra-op Plan:   Post-operative Plan:   Informed Consent: I have reviewed the patients History and Physical, chart, labs and discussed the procedure including the risks, benefits and alternatives for the proposed anesthesia with the patient or authorized representative who has indicated his/her understanding and acceptance.       Plan Discussed with: CRNA  Anesthesia Plan Comments:         Anesthesia Quick Evaluation

## 2018-02-16 NOTE — Transfer of Care (Signed)
Immediate Anesthesia Transfer of Care Note  Patient: Kathleen Wallace  Procedure(s) Performed: CATARACT EXTRACTION PHACO AND INTRAOCULAR LENS PLACEMENT (IOC) RIGHT (Right Eye)  Patient Location: PACU  Anesthesia Type:MAC  Level of Consciousness: awake  Airway & Oxygen Therapy: Patient Spontanous Breathing  Post-op Assessment: Report given to RN and Post -op Vital signs reviewed and stable  Post vital signs: stable  Last Vitals:  Vitals Value Taken Time  BP    Temp    Pulse    Resp    SpO2      Last Pain:  Vitals:   02/16/18 0818  TempSrc: Tympanic  PainSc: 2          Complications: No apparent anesthesia complications

## 2018-02-16 NOTE — Discharge Instructions (Signed)
Eye Surgery Discharge Instructions    Expect mild scratchy sensation or mild soreness. DO NOT RUB YOUR EYE!  The day of surgery:  Minimal physical activity, but bed rest is not required  No reading, computer work, or close hand work  No bending, lifting, or straining.  May watch TV  For 24 hours:  No driving, legal decisions, or alcoholic beverages  Safety precautions  Eat anything you prefer: It is better to start with liquids, then soup then solid foods.  _____ Eye patch should be worn until postoperative exam tomorrow.  ____ Solar shield eyeglasses should be worn for comfort in the sunlight/patch while sleeping  Resume all regular medications including aspirin or Coumadin if these were discontinued prior to surgery. You may shower, bathe, shave, or wash your hair. Tylenol may be taken for mild discomfort.  Call your doctor if you experience significant pain, nausea, or vomiting, fever > 101 or other signs of infection. [phone] or [phone] Specific instructions:  Follow-up Information    Marchia Meiers, MD Follow up on 02/17/2018.   Specialty:  Ophthalmology Why:  appointment time @ 10:15 AM Contact information: [place] [address] [phone]

## 2018-02-17 ENCOUNTER — Encounter: Payer: Self-pay | Admitting: Ophthalmology

## 2018-02-25 DIAGNOSIS — M48062 Spinal stenosis, lumbar region with neurogenic claudication: Secondary | ICD-10-CM | POA: Diagnosis not present

## 2018-02-25 DIAGNOSIS — M5136 Other intervertebral disc degeneration, lumbar region: Secondary | ICD-10-CM | POA: Diagnosis not present

## 2018-02-25 DIAGNOSIS — M5416 Radiculopathy, lumbar region: Secondary | ICD-10-CM | POA: Diagnosis not present

## 2018-03-01 DIAGNOSIS — H2512 Age-related nuclear cataract, left eye: Secondary | ICD-10-CM | POA: Diagnosis not present

## 2018-03-10 ENCOUNTER — Encounter: Payer: Self-pay | Admitting: *Deleted

## 2018-03-10 DIAGNOSIS — M5416 Radiculopathy, lumbar region: Secondary | ICD-10-CM | POA: Diagnosis not present

## 2018-03-15 DIAGNOSIS — M5416 Radiculopathy, lumbar region: Secondary | ICD-10-CM | POA: Diagnosis not present

## 2018-03-17 DIAGNOSIS — M5416 Radiculopathy, lumbar region: Secondary | ICD-10-CM | POA: Diagnosis not present

## 2018-03-18 ENCOUNTER — Ambulatory Visit: Payer: Medicare HMO | Admitting: Anesthesiology

## 2018-03-18 ENCOUNTER — Ambulatory Visit
Admission: RE | Admit: 2018-03-18 | Discharge: 2018-03-18 | Disposition: A | Discharge status: Home / Self Care | Payer: Medicare HMO | Attending: Ophthalmology | Admitting: Ophthalmology

## 2018-03-18 ENCOUNTER — Other Ambulatory Visit: Payer: Self-pay

## 2018-03-18 ENCOUNTER — Encounter: Payer: Self-pay | Admitting: Emergency Medicine

## 2018-03-18 ENCOUNTER — Encounter
Admission: RE | Disposition: A | Discharge status: Home / Self Care | Payer: Self-pay | Source: Home / Self Care | Attending: Ophthalmology

## 2018-03-18 DIAGNOSIS — Z9849 Cataract extraction status, unspecified eye: Secondary | ICD-10-CM | POA: Insufficient documentation

## 2018-03-18 DIAGNOSIS — Z79899 Other long term (current) drug therapy: Secondary | ICD-10-CM | POA: Insufficient documentation

## 2018-03-18 DIAGNOSIS — K219 Gastro-esophageal reflux disease without esophagitis: Secondary | ICD-10-CM | POA: Insufficient documentation

## 2018-03-18 DIAGNOSIS — Z885 Allergy status to narcotic agent status: Secondary | ICD-10-CM | POA: Insufficient documentation

## 2018-03-18 DIAGNOSIS — R011 Cardiac murmur, unspecified: Secondary | ICD-10-CM | POA: Insufficient documentation

## 2018-03-18 DIAGNOSIS — Z888 Allergy status to other drugs, medicaments and biological substances status: Secondary | ICD-10-CM | POA: Diagnosis not present

## 2018-03-18 DIAGNOSIS — M4800 Spinal stenosis, site unspecified: Secondary | ICD-10-CM | POA: Diagnosis not present

## 2018-03-18 DIAGNOSIS — Z880 Allergy status to penicillin: Secondary | ICD-10-CM | POA: Insufficient documentation

## 2018-03-18 DIAGNOSIS — H2512 Age-related nuclear cataract, left eye: Secondary | ICD-10-CM | POA: Diagnosis not present

## 2018-03-18 DIAGNOSIS — Z90710 Acquired absence of both cervix and uterus: Secondary | ICD-10-CM | POA: Diagnosis not present

## 2018-03-18 HISTORY — PX: CATARACT EXTRACTION W/PHACO: SHX586

## 2018-03-18 SURGERY — PHACOEMULSIFICATION, CATARACT, WITH IOL INSERTION
Anesthesia: Monitor Anesthesia Care | Site: Eye | Laterality: Left

## 2018-03-18 MED ORDER — FENTANYL CITRATE (PF) 100 MCG/2ML IJ SOLN
INTRAMUSCULAR | Status: AC
  Filled 2018-03-18: qty 2

## 2018-03-18 MED ORDER — TETRACAINE HCL 0.5 % OP SOLN
1.0000 [drp] | OPHTHALMIC | Status: AC | PRN
  Administered 2018-03-18 (×3): 1 [drp] via OPHTHALMIC

## 2018-03-18 MED ORDER — MIDAZOLAM HCL 2 MG/2ML IJ SOLN
INTRAMUSCULAR | Status: DC | PRN
  Administered 2018-03-18: 1 mg via INTRAVENOUS

## 2018-03-18 MED ORDER — MOXIFLOXACIN HCL 0.5 % OP SOLN: 1.0000 [drp] | OPHTHALMIC | Status: DC | PRN

## 2018-03-18 MED ORDER — DORZOLAMIDE HCL-TIMOLOL MAL 2-0.5 % OP SOLN
OPHTHALMIC | Status: DC | PRN
  Administered 2018-03-18: 1 [drp] via OPHTHALMIC

## 2018-03-18 MED ORDER — NA CHONDROIT SULF-NA HYALURON 40-17 MG/ML IO SOLN
INTRAOCULAR | Status: AC
  Filled 2018-03-18: qty 1

## 2018-03-18 MED ORDER — CARBACHOL 0.01 % IO SOLN
INTRAOCULAR | Status: DC | PRN
  Administered 2018-03-18: 0.5 mL via INTRAOCULAR

## 2018-03-18 MED ORDER — EPINEPHRINE PF 1 MG/ML IJ SOLN
INTRAOCULAR | Status: DC | PRN
  Administered 2018-03-18: 09:00:00 via OPHTHALMIC

## 2018-03-18 MED ORDER — LIDOCAINE HCL (PF) 4 % IJ SOLN
INTRAOCULAR | Status: DC | PRN
  Administered 2018-03-18: 4 mL via OPHTHALMIC

## 2018-03-18 MED ORDER — TRYPAN BLUE 0.06 % OP SOLN
OPHTHALMIC | Status: DC | PRN
  Administered 2018-03-18: 0.5 mL via INTRAOCULAR

## 2018-03-18 MED ORDER — LIDOCAINE HCL (PF) 4 % IJ SOLN
INTRAMUSCULAR | Status: AC
  Filled 2018-03-18: qty 5

## 2018-03-18 MED ORDER — MIDAZOLAM HCL 2 MG/2ML IJ SOLN
INTRAMUSCULAR | Status: AC
  Filled 2018-03-18: qty 2

## 2018-03-18 MED ORDER — ARMC OPHTHALMIC DILATING DROPS
1.0000 "application " | OPHTHALMIC | Status: AC
  Administered 2018-03-18 (×3): 1 via OPHTHALMIC

## 2018-03-18 MED ORDER — NA HYALUR & NA CHOND-NA HYALUR 0.55-0.5 ML IO KIT
PACK | INTRAOCULAR | Status: DC | PRN
  Administered 2018-03-18: 1 via OPHTHALMIC

## 2018-03-18 MED ORDER — NA CHONDROIT SULF-NA HYALURON 40-17 MG/ML IO SOLN
INTRAOCULAR | Status: DC | PRN
  Administered 2018-03-18: 1 mL via INTRAOCULAR

## 2018-03-18 MED ORDER — NA HYALUR & NA CHOND-NA HYALUR 0.55-0.5 ML IO KIT
PACK | INTRAOCULAR | Status: AC
  Filled 2018-03-18: qty 1.05

## 2018-03-18 MED ORDER — POVIDONE-IODINE 5 % OP SOLN
OPHTHALMIC | Status: DC | PRN
  Administered 2018-03-18: 1 via OPHTHALMIC

## 2018-03-18 MED ORDER — TRYPAN BLUE 0.06 % OP SOLN
OPHTHALMIC | Status: AC
  Filled 2018-03-18: qty 0.5

## 2018-03-18 MED ORDER — EPINEPHRINE PF 1 MG/ML IJ SOLN
INTRAMUSCULAR | Status: AC
  Filled 2018-03-18: qty 2

## 2018-03-18 MED ORDER — POVIDONE-IODINE 5 % OP SOLN
OPHTHALMIC | Status: AC
  Filled 2018-03-18: qty 30

## 2018-03-18 MED ORDER — MOXIFLOXACIN HCL 0.5 % OP SOLN
OPHTHALMIC | Status: AC
  Filled 2018-03-18: qty 3

## 2018-03-18 MED ORDER — MOXIFLOXACIN HCL 0.5 % OP SOLN
OPHTHALMIC | Status: DC | PRN
  Administered 2018-03-18: 0.2 mL via OPHTHALMIC

## 2018-03-18 MED ORDER — ARMC OPHTHALMIC DILATING DROPS
OPHTHALMIC | Status: AC
  Administered 2018-03-18: 1 via OPHTHALMIC
  Filled 2018-03-18: qty 0.5

## 2018-03-18 MED ORDER — FENTANYL CITRATE (PF) 100 MCG/2ML IJ SOLN
INTRAMUSCULAR | Status: DC | PRN
  Administered 2018-03-18: 25 ug via INTRAVENOUS

## 2018-03-18 MED ORDER — SODIUM CHLORIDE 0.9 % IV SOLN
INTRAVENOUS | Status: DC
  Administered 2018-03-18: 07:00:00 via INTRAVENOUS

## 2018-03-18 MED ORDER — TETRACAINE HCL 0.5 % OP SOLN
OPHTHALMIC | Status: AC
  Administered 2018-03-18: 1 [drp] via OPHTHALMIC
  Filled 2018-03-18: qty 4

## 2018-03-18 SURGICAL SUPPLY — 17 items
DISSECTOR HYDRO NUCLEUS 50X22 (MISCELLANEOUS) ×8 IMPLANT
GLOVE BIOGEL M 6.5 STRL (GLOVE) ×2 IMPLANT
GOWN STRL REUS W/ TWL LRG LVL3 (GOWN DISPOSABLE) ×1 IMPLANT
GOWN STRL REUS W/ TWL XL LVL3 (GOWN DISPOSABLE) ×1 IMPLANT
GOWN STRL REUS W/TWL LRG LVL3 (GOWN DISPOSABLE) ×1
GOWN STRL REUS W/TWL XL LVL3 (GOWN DISPOSABLE) ×1
KNIFE 45D UP 2.3 (MISCELLANEOUS) ×2 IMPLANT
LABEL CATARACT MEDS ST (LABEL) ×2 IMPLANT
LENS IOL ACRYSOF IQ 20.0 (Intraocular Lens) ×2 IMPLANT
PACK CATARACT (MISCELLANEOUS) ×2 IMPLANT
PACK CATARACT KING (MISCELLANEOUS) ×2 IMPLANT
PACK EYE AFTER SURG (MISCELLANEOUS) ×2 IMPLANT
SOL BSS BAG (MISCELLANEOUS) ×2
SOLUTION BSS BAG (MISCELLANEOUS) ×1 IMPLANT
SYR 5ML LL (SYRINGE) ×2 IMPLANT
WATER STERILE IRR 250ML POUR (IV SOLUTION) ×2 IMPLANT
WIPE NON LINTING 3.25X3.25 (MISCELLANEOUS) ×2 IMPLANT

## 2018-03-18 NOTE — Anesthesia Preprocedure Evaluation (Signed)
Anesthesia Evaluation  Patient identified by MRN, date of birth, ID band Patient awake    Reviewed: Allergy & Precautions, NPO status , Patient's Chart, lab work & pertinent test results  History of Anesthesia Complications Negative for: history of anesthetic complications  Airway Mallampati: II  TM Distance: >3 FB Neck ROM: Full    Dental  (+) Partial Lower   Pulmonary neg pulmonary ROS, neg sleep apnea, neg COPD,    breath sounds clear to auscultation- rhonchi (-) wheezing      Cardiovascular hypertension, (-) CAD, (-) Past MI, (-) Cardiac Stents and (-) CABG  Rhythm:Regular Rate:Normal - Systolic murmurs and - Diastolic murmurs    Neuro/Psych neg Seizures negative neurological ROS  negative psych ROS   GI/Hepatic Neg liver ROS, GERD  ,  Endo/Other  negative endocrine ROSneg diabetes  Renal/GU negative Renal ROS     Musculoskeletal negative musculoskeletal ROS (+)   Abdominal (+) - obese,   Peds  Hematology negative hematology ROS (+)   Anesthesia Other Findings Past Medical History: No date: Elevated blood pressure No date: Heart murmur   Reproductive/Obstetrics                             Anesthesia Physical Anesthesia Plan  ASA: II  Anesthesia Plan: MAC   Post-op Pain Management:    Induction: Intravenous  PONV Risk Score and Plan: 2 and Midazolam  Airway Management Planned: Natural Airway  Additional Equipment:   Intra-op Plan:   Post-operative Plan:   Informed Consent: I have reviewed the patients History and Physical, chart, labs and discussed the procedure including the risks, benefits and alternatives for the proposed anesthesia with the patient or authorized representative who has indicated his/her understanding and acceptance.       Plan Discussed with: CRNA and Anesthesiologist  Anesthesia Plan Comments:         Anesthesia Quick Evaluation

## 2018-03-18 NOTE — Anesthesia Post-op Follow-up Note (Signed)
Anesthesia QCDR form completed.        

## 2018-03-18 NOTE — Op Note (Signed)
.   PREOPERATIVE DIAGNOSIS:  Nuclear sclerotic cataract of the LEFT eye.   POSTOPERATIVE DIAGNOSIS:  Nuclear sclerotic cataract of the LEFT eye.   OPERATIVE PROCEDURE: Cataract surgery OS   SURGEON:  Marchia Meiers, MD.   ANESTHESIA:  Anesthesiologist: Emmie Niemann, MD CRNA: Lavone Orn, CRNA  1.      Managed anesthesia care. 2.     0.44ml of Shugarcaine was instilled following the paracentesis   COMPLICATIONS:  None.   TECHNIQUE:   Divide and conquer   DESCRIPTION OF PROCEDURE:  The patient was examined and consented in the preoperative holding area where the aforementioned topical anesthesia was applied to the LEFT eye and then brought back to the Operating Room where the left eye was prepped and draped in the usual sterile ophthalmic fashion and a lid speculum was placed. A paracentesis was created with the side port blade, the anterior chamber was washed out with trypan blue to stain the anterior capsule, and the anterior chamber was filled with viscoelastic. A near clear corneal incision was performed with the steel keratome. A continuous curvilinear capsulorrhexis was performed with a cystotome followed by the capsulorrhexis forceps. Hydrodissection and hydrodelineation were carried out with BSS on a blunt cannula. The lens was removed in a divide and conquer  technique and the remaining cortical material was removed with the irrigation-aspiration handpiece. The capsular bag was inflated with viscoelastic and the lens was placed in the capsular bag without complication. The remaining viscoelastic was removed from the eye with the irrigation-aspiration handpiece. The wounds were hydrated. The anterior chamber was flushed and the eye was inflated to physiologic pressure. 0.19ml Vigamox was placed in the anterior chamber. The wounds were found to be water tight. The eye was dressed with Vigamox. The patient was given protective glasses to wear throughout the day and a shield with  which to sleep tonight. The patient was also given drops with which to begin a drop regimen today and will follow-up with me in one day. Implant Name Type Inv. Item Serial No. Manufacturer Lot No. LRB No. Used  LENS IOL ACRYSOF IQ 20.0 - [id number] Intraocular Lens LENS IOL ACRYSOF IQ 20.0 [id number] ALCON  Left 1    Procedure(s) with comments: CATARACT EXTRACTION PHACO AND INTRAOCULAR LENS PLACEMENT (IOC) LEFT (Left) - [place] 01:06.5 CDE 10.80 FLUID PACK LOT # [id number]  Electronically signed: Ashira Kirsten 03/18/2018 9:30 AM

## 2018-03-18 NOTE — Transfer of Care (Signed)
Immediate Anesthesia Transfer of Care Note  Patient: Kathleen Wallace  Procedure(s) Performed: CATARACT EXTRACTION PHACO AND INTRAOCULAR LENS PLACEMENT (IOC) LEFT (Left Eye)  Patient Location: PACU  Anesthesia Type:MAC  Level of Consciousness: awake  Airway & Oxygen Therapy: Patient Spontanous Breathing  Post-op Assessment: Report given to RN and Post -op Vital signs reviewed and stable  Post vital signs: stable  Last Vitals:  Vitals Value Taken Time  BP    Temp    Pulse    Resp    SpO2      Last Pain:  Vitals:   03/18/18 0702  TempSrc: Oral  PainSc: 2          Complications: No apparent anesthesia complications

## 2018-03-18 NOTE — Anesthesia Postprocedure Evaluation (Signed)
Anesthesia Post Note  Patient: Kathleen Wallace  Procedure(s) Performed: CATARACT EXTRACTION PHACO AND INTRAOCULAR LENS PLACEMENT (IOC) LEFT (Left Eye)  Anesthesia Type: MAC Level of consciousness: awake Pain management: pain level controlled Vital Signs Assessment: post-procedure vital signs reviewed and stable Respiratory status: spontaneous breathing Cardiovascular status: blood pressure returned to baseline Postop Assessment: no apparent nausea or vomiting and adequate PO intake Anesthetic complications: no     Last Vitals:  Vitals:   03/18/18 0702 03/18/18 0934  BP: (!) 155/75 (!) 153/81  Pulse: 80 72  Resp: 17 16  Temp: 36.6 C 37.1 C  SpO2: 97% 98%    Last Pain:  Vitals:   03/18/18 0934  TempSrc: Oral  PainSc: 0-No pain                 Lavone Orn

## 2018-03-18 NOTE — Discharge Instructions (Signed)
Eye Surgery Discharge Instructions    Expect mild scratchy sensation or mild soreness. DO NOT RUB YOUR EYE!  The day of surgery:  Minimal physical activity, but bed rest is not required  No reading, computer work, or close hand work  No bending, lifting, or straining.  May watch TV  For 24 hours:  No driving, legal decisions, or alcoholic beverages  Safety precautions  Eat anything you prefer: It is better to start with liquids, then soup then solid foods.  _____ Eye patch should be worn until postoperative exam tomorrow.  ____ Solar shield eyeglasses should be worn for comfort in the sunlight/patch while sleeping  Resume all regular medications including aspirin or Coumadin if these were discontinued prior to surgery. You may shower, bathe, shave, or wash your hair. Tylenol may be taken for mild discomfort.  Call your doctor if you experience significant pain, nausea, or vomiting, fever > 101 or other signs of infection. [phone] or [phone] Specific instructions:  Follow-up Information    Marchia Meiers, MD Follow up on 03/19/2018.   Specialty:  Ophthalmology Why:  at 10:15am Contact information: [address] [place] [address] [phone]

## 2018-03-18 NOTE — H&P (Signed)
   I have reviewed the patient's H&P and agree with its findings. There have been no interval changes.  Anthonia Monger MD Ophthalmology 

## 2018-03-25 DIAGNOSIS — M5136 Other intervertebral disc degeneration, lumbar region: Secondary | ICD-10-CM | POA: Diagnosis not present

## 2018-03-25 DIAGNOSIS — M48062 Spinal stenosis, lumbar region with neurogenic claudication: Secondary | ICD-10-CM | POA: Diagnosis not present

## 2018-03-25 DIAGNOSIS — M5416 Radiculopathy, lumbar region: Secondary | ICD-10-CM | POA: Diagnosis not present

## 2018-04-27 DIAGNOSIS — M5416 Radiculopathy, lumbar region: Secondary | ICD-10-CM | POA: Diagnosis not present

## 2018-06-08 DIAGNOSIS — M48061 Spinal stenosis, lumbar region without neurogenic claudication: Secondary | ICD-10-CM | POA: Diagnosis not present

## 2018-06-08 DIAGNOSIS — M5416 Radiculopathy, lumbar region: Secondary | ICD-10-CM | POA: Diagnosis not present

## 2018-06-11 DIAGNOSIS — H35372 Puckering of macula, left eye: Secondary | ICD-10-CM | POA: Diagnosis not present

## 2018-07-12 DIAGNOSIS — H35372 Puckering of macula, left eye: Secondary | ICD-10-CM | POA: Diagnosis not present

## 2018-07-19 DIAGNOSIS — H4089 Other specified glaucoma: Secondary | ICD-10-CM | POA: Diagnosis not present

## 2018-07-20 DIAGNOSIS — W57XXXA Bitten or stung by nonvenomous insect and other nonvenomous arthropods, initial encounter: Secondary | ICD-10-CM | POA: Diagnosis not present

## 2018-07-20 DIAGNOSIS — S30861A Insect bite (nonvenomous) of abdominal wall, initial encounter: Secondary | ICD-10-CM | POA: Diagnosis not present

## 2018-07-29 DIAGNOSIS — H4089 Other specified glaucoma: Secondary | ICD-10-CM | POA: Diagnosis not present

## 2018-09-01 DIAGNOSIS — H40031 Anatomical narrow angle, right eye: Secondary | ICD-10-CM | POA: Diagnosis not present

## 2018-09-01 DIAGNOSIS — H4089 Other specified glaucoma: Secondary | ICD-10-CM | POA: Diagnosis not present

## 2018-09-01 DIAGNOSIS — Z961 Presence of intraocular lens: Secondary | ICD-10-CM | POA: Diagnosis not present

## 2018-09-07 ENCOUNTER — Other Ambulatory Visit: Payer: Self-pay | Admitting: Neurosurgery

## 2018-09-07 DIAGNOSIS — M48061 Spinal stenosis, lumbar region without neurogenic claudication: Secondary | ICD-10-CM | POA: Diagnosis not present

## 2018-09-14 ENCOUNTER — Other Ambulatory Visit: Payer: Self-pay

## 2018-09-14 ENCOUNTER — Encounter
Admission: RE | Admit: 2018-09-14 | Discharge: 2018-09-14 | Disposition: A | Discharge status: Home / Self Care | Payer: Medicare HMO | Source: Ambulatory Visit | Attending: Neurosurgery | Admitting: Neurosurgery

## 2018-09-14 DIAGNOSIS — Z20828 Contact with and (suspected) exposure to other viral communicable diseases: Secondary | ICD-10-CM | POA: Diagnosis not present

## 2018-09-14 DIAGNOSIS — R008 Other abnormalities of heart beat: Secondary | ICD-10-CM | POA: Diagnosis not present

## 2018-09-14 DIAGNOSIS — R011 Cardiac murmur, unspecified: Secondary | ICD-10-CM | POA: Insufficient documentation

## 2018-09-14 DIAGNOSIS — I10 Essential (primary) hypertension: Secondary | ICD-10-CM | POA: Diagnosis not present

## 2018-09-14 DIAGNOSIS — R9431 Abnormal electrocardiogram [ECG] [EKG]: Secondary | ICD-10-CM | POA: Insufficient documentation

## 2018-09-14 DIAGNOSIS — Z01818 Encounter for other preprocedural examination: Secondary | ICD-10-CM | POA: Diagnosis not present

## 2018-09-14 HISTORY — DX: Headache, unspecified: R51.9

## 2018-09-14 HISTORY — DX: Other complications of anesthesia, initial encounter: T88.59XA

## 2018-09-14 HISTORY — DX: Malignant (primary) neoplasm, unspecified: C80.1

## 2018-09-14 HISTORY — DX: Gastro-esophageal reflux disease without esophagitis: K21.9

## 2018-09-14 LAB — CBC
HCT: 37.6 % (ref 36.0–46.0)
Hemoglobin: 12.7 g/dL (ref 12.0–15.0)
MCH: 31.7 pg (ref 26.0–34.0)
MCHC: 33.8 g/dL (ref 30.0–36.0)
MCV: 93.8 fL (ref 80.0–100.0)
Platelets: 310 10*3/uL (ref 150–400)
RBC: 4.01 MIL/uL (ref 3.87–5.11)
RDW: 13.1 % (ref 11.5–15.5)
WBC: 8.3 10*3/uL (ref 4.0–10.5)
nRBC: 0 % (ref 0.0–0.2)

## 2018-09-14 LAB — APTT: aPTT: 26 seconds (ref 24–36)

## 2018-09-14 LAB — BASIC METABOLIC PANEL
Anion gap: 8 (ref 5–15)
BUN: 18 mg/dL (ref 8–23)
CO2: 24 mmol/L (ref 22–32)
Calcium: 8.8 mg/dL — ABNORMAL LOW (ref 8.9–10.3)
Chloride: 107 mmol/L (ref 98–111)
Creatinine, Ser: 0.8 mg/dL (ref 0.44–1.00)
GFR calc Af Amer: >60 mL/min (ref >60)
GFR calc non Af Amer: >60 mL/min (ref >60)
Glucose, Bld: 100 mg/dL — ABNORMAL HIGH (ref 70–99)
Potassium: 4.7 mmol/L (ref 3.5–5.1)
Sodium: 139 mmol/L (ref 135–145)

## 2018-09-14 LAB — URINALYSIS, ROUTINE W REFLEX MICROSCOPIC
Bilirubin Urine: NEGATIVE
Glucose, UA: NEGATIVE mg/dL
Hgb urine dipstick: NEGATIVE
Ketones, ur: NEGATIVE mg/dL
Leukocytes,Ua: NEGATIVE
Nitrite: NEGATIVE
Protein, ur: NEGATIVE mg/dL
Specific Gravity, Urine: 1.021 (ref 1.005–1.030)
pH: 6 (ref 5.0–8.0)

## 2018-09-14 LAB — PROTIME-INR
INR: 0.9 (ref 0.8–1.2)
Prothrombin Time: 12.3 seconds (ref 11.4–15.2)

## 2018-09-14 NOTE — Patient Instructions (Signed)
Your procedure is scheduled on: 09-20-18 MONDAY Report to Same Day Surgery 2nd floor medical mall [facility] Entrance-take elevator on left to 2nd floor.  Check in with surgery information desk.) To find out your arrival time please call [phone] between 1PM - 3PM on 08-17-18 FRIDAY  Remember: Instructions that are not followed completely may result in serious medical risk, up to and including death, or upon the discretion of your surgeon and anesthesiologist your surgery may need to be rescheduled.    _x___ 1. Do not eat food after midnight the night before your procedure. NO GUM OR CANDY AFTER MIDNIGHT. You may drink clear liquids up to 2 hours before you are scheduled to arrive at the hospital for your procedure.  Do not drink clear liquids within 2 hours of your scheduled arrival to the hospital.  Clear liquids include  --Water or Apple juice without pulp  --Clear carbohydrate beverage such as ClearFast or Gatorade  --Black Coffee or Clear Tea (No milk, no creamers, do not add anything to the coffee or Tea   ____Ensure clear carbohydrate drink on the way to the hospital for bariatric patients  ____Ensure clear carbohydrate drink 3 hours before surgery.     __x__ 2. No Alcohol for 24 hours before or after surgery.   __x__3. No Smoking or e-cigarettes for 24 prior to surgery.  Do not use any chewable tobacco products for at least 6 hour prior to surgery   ____  4. Bring all medications with you on the day of surgery if instructed.    __x__ 5. Notify your doctor if there is any change in your medical condition     (cold, fever, infections).    x___6. On the morning of surgery brush your teeth with toothpaste and water.  You may rinse your mouth with mouth wash if you wish.  Do not swallow any toothpaste or mouthwash.   Do not wear jewelry, make-up, hairpins, clips or nail polish.  Do not wear lotions, powders, or perfumes. You may wear deodorant.  Do not shave 48 hours prior  to surgery. Men may shave face and neck.  Do not bring valuables to the hospital.    [facility] is not responsible for any belongings or valuables.               Contacts, dentures or bridgework may not be worn into surgery.  Leave your suitcase in the car. After surgery it may be brought to your room.  For patients admitted to the hospital, discharge time is determined by your treatment team.  _  Patients discharged the day of surgery will not be allowed to drive home.  You will need someone to drive you home and stay with you the night of your procedure.    Please read over the following fact sheets that you were given:   [facility] Preparing for Surgery    ____ Take anti-hypertensive listed below, cardiac, seizure, asthma, anti-reflux and psychiatric medicines. These include:  1.NONE   2.  3.  4.  5.  6.  ____Fleets enema or Magnesium Citrate as directed.   _x___ Use CHG Soap or sage wipes as directed on instruction sheet   ____ Use inhalers on the day of surgery and bring to hospital day of surgery  ____ Stop Metformin and Janumet 2 days prior to surgery.    ____ Take 1/2 of usual insulin dose the night before surgery and none on the morning surgery.   ____  Follow recommendations from Cardiologist, Pulmonologist or PCP regarding  stopping Aspirin, Coumadin, Plavix ,Eliquis, Effient, or Pradaxa, and Pletal.  X____Stop Anti-inflammatories such as Advil, Aleve, Ibuprofen, Motrin, Naproxen, Naprosyn, Goodies powders or aspirin products NOW-OK to take Tylenol    _x___ Stop supplements until after surgery-STOP CO Q 10 NOW-MAY RESUME AFTER SURGERY   ____ Bring C-Pap to the hospital.

## 2018-09-16 ENCOUNTER — Other Ambulatory Visit
Admission: RE | Admit: 2018-09-16 | Discharge: 2018-09-16 | Disposition: A | Discharge status: Home / Self Care | Payer: Medicare HMO | Source: Ambulatory Visit | Attending: Neurosurgery | Admitting: Neurosurgery

## 2018-09-16 ENCOUNTER — Other Ambulatory Visit: Payer: Self-pay

## 2018-09-16 DIAGNOSIS — Z20828 Contact with and (suspected) exposure to other viral communicable diseases: Secondary | ICD-10-CM | POA: Diagnosis not present

## 2018-09-16 DIAGNOSIS — Z01818 Encounter for other preprocedural examination: Secondary | ICD-10-CM | POA: Diagnosis not present

## 2018-09-16 DIAGNOSIS — I10 Essential (primary) hypertension: Secondary | ICD-10-CM | POA: Diagnosis not present

## 2018-09-16 DIAGNOSIS — R011 Cardiac murmur, unspecified: Secondary | ICD-10-CM | POA: Diagnosis not present

## 2018-09-16 DIAGNOSIS — R9431 Abnormal electrocardiogram [ECG] [EKG]: Secondary | ICD-10-CM | POA: Diagnosis not present

## 2018-09-16 LAB — SARS CORONAVIRUS 2 (TAT 6-24 HRS): SARS Coronavirus 2: NEGATIVE

## 2018-09-17 LAB — TYPE AND SCREEN: Antibody Screen: NEGATIVE

## 2018-09-18 ENCOUNTER — Encounter: Payer: Self-pay | Admitting: Anesthesiology

## 2018-09-20 ENCOUNTER — Ambulatory Visit
Admission: RE | Admit: 2018-09-20 | Discharge: 2018-09-20 | Disposition: A | Discharge status: Home / Self Care | Payer: Medicare HMO | Attending: Neurosurgery | Admitting: Neurosurgery

## 2018-09-20 ENCOUNTER — Ambulatory Visit: Payer: Medicare HMO

## 2018-09-20 ENCOUNTER — Ambulatory Visit: Payer: Medicare HMO | Admitting: Anesthesiology

## 2018-09-20 ENCOUNTER — Encounter
Admission: RE | Disposition: A | Discharge status: Home / Self Care | Payer: Self-pay | Source: Home / Self Care | Attending: Neurosurgery

## 2018-09-20 ENCOUNTER — Encounter: Payer: Self-pay | Admitting: *Deleted

## 2018-09-20 DIAGNOSIS — E7800 Pure hypercholesterolemia, unspecified: Secondary | ICD-10-CM | POA: Diagnosis not present

## 2018-09-20 DIAGNOSIS — I10 Essential (primary) hypertension: Secondary | ICD-10-CM | POA: Diagnosis not present

## 2018-09-20 DIAGNOSIS — M48061 Spinal stenosis, lumbar region without neurogenic claudication: Secondary | ICD-10-CM | POA: Diagnosis not present

## 2018-09-20 DIAGNOSIS — Z885 Allergy status to narcotic agent status: Secondary | ICD-10-CM | POA: Insufficient documentation

## 2018-09-20 DIAGNOSIS — M79604 Pain in right leg: Secondary | ICD-10-CM | POA: Diagnosis not present

## 2018-09-20 DIAGNOSIS — Z79899 Other long term (current) drug therapy: Secondary | ICD-10-CM | POA: Insufficient documentation

## 2018-09-20 DIAGNOSIS — M48062 Spinal stenosis, lumbar region with neurogenic claudication: Secondary | ICD-10-CM | POA: Diagnosis present

## 2018-09-20 DIAGNOSIS — Z881 Allergy status to other antibiotic agents status: Secondary | ICD-10-CM | POA: Diagnosis not present

## 2018-09-20 DIAGNOSIS — M79605 Pain in left leg: Secondary | ICD-10-CM | POA: Insufficient documentation

## 2018-09-20 DIAGNOSIS — M5416 Radiculopathy, lumbar region: Secondary | ICD-10-CM | POA: Diagnosis not present

## 2018-09-20 DIAGNOSIS — Z85828 Personal history of other malignant neoplasm of skin: Secondary | ICD-10-CM | POA: Insufficient documentation

## 2018-09-20 DIAGNOSIS — Z981 Arthrodesis status: Secondary | ICD-10-CM | POA: Diagnosis not present

## 2018-09-20 DIAGNOSIS — K219 Gastro-esophageal reflux disease without esophagitis: Secondary | ICD-10-CM | POA: Diagnosis not present

## 2018-09-20 DIAGNOSIS — Z419 Encounter for procedure for purposes other than remedying health state, unspecified: Secondary | ICD-10-CM

## 2018-09-20 HISTORY — PX: LUMBAR LAMINECTOMY/ DECOMPRESSION WITH MET-RX: SHX5959

## 2018-09-20 LAB — ABO/RH: ABO/RH(D): UNDETERMINED

## 2018-09-20 SURGERY — LUMBAR LAMINECTOMY/ DECOMPRESSION WITH MET-RX
Anesthesia: General | Laterality: Bilateral

## 2018-09-20 MED ORDER — DEXAMETHASONE SODIUM PHOSPHATE 10 MG/ML IJ SOLN
INTRAMUSCULAR | Status: AC
  Filled 2018-09-20: qty 1

## 2018-09-20 MED ORDER — LACTATED RINGERS IV SOLN
INTRAVENOUS | Status: DC
  Administered 2018-09-20 (×2): via INTRAVENOUS

## 2018-09-20 MED ORDER — SODIUM CHLORIDE 0.9% FLUSH
INTRAVENOUS | Status: DC | PRN
  Administered 2018-09-20: 10 mL

## 2018-09-20 MED ORDER — DEXAMETHASONE SODIUM PHOSPHATE 10 MG/ML IJ SOLN
INTRAMUSCULAR | Status: DC | PRN
  Administered 2018-09-20: 8 mg via INTRAVENOUS

## 2018-09-20 MED ORDER — SUCCINYLCHOLINE CHLORIDE 20 MG/ML IJ SOLN
INTRAMUSCULAR | Status: AC
  Filled 2018-09-20: qty 1

## 2018-09-20 MED ORDER — ONDANSETRON HCL 4 MG/2ML IJ SOLN
INTRAMUSCULAR | Status: AC
  Filled 2018-09-20: qty 2

## 2018-09-20 MED ORDER — FAMOTIDINE 20 MG PO TABS
ORAL_TABLET | ORAL | Status: AC
  Administered 2018-09-20: 20 mg via ORAL
  Filled 2018-09-20: qty 1

## 2018-09-20 MED ORDER — ONDANSETRON HCL 4 MG/2ML IJ SOLN
4.0000 mg | Freq: Once | INTRAMUSCULAR | Status: AC | PRN
  Administered 2018-09-20: 4 mg via INTRAVENOUS

## 2018-09-20 MED ORDER — METHYLPREDNISOLONE ACETATE 40 MG/ML IJ SUSP
INTRAMUSCULAR | Status: AC
  Filled 2018-09-20: qty 1

## 2018-09-20 MED ORDER — FENTANYL CITRATE (PF) 100 MCG/2ML IJ SOLN
INTRAMUSCULAR | Status: DC | PRN
  Administered 2018-09-20: 25 ug via INTRAVENOUS
  Administered 2018-09-20: 75 ug via INTRAVENOUS
  Administered 2018-09-20: 25 ug via INTRAVENOUS

## 2018-09-20 MED ORDER — FENTANYL CITRATE (PF) 100 MCG/2ML IJ SOLN: 25.0000 ug | INTRAMUSCULAR | Status: DC | PRN

## 2018-09-20 MED ORDER — ROCURONIUM BROMIDE 50 MG/5ML IV SOLN
INTRAVENOUS | Status: AC
  Filled 2018-09-20: qty 1

## 2018-09-20 MED ORDER — PROPOFOL 10 MG/ML IV BOLUS
INTRAVENOUS | Status: DC | PRN
  Administered 2018-09-20: 20 mg via INTRAVENOUS
  Administered 2018-09-20: 140 mg via INTRAVENOUS

## 2018-09-20 MED ORDER — SUCCINYLCHOLINE CHLORIDE 20 MG/ML IJ SOLN
INTRAMUSCULAR | Status: DC | PRN
  Administered 2018-09-20: 100 mg via INTRAVENOUS

## 2018-09-20 MED ORDER — LIDOCAINE-EPINEPHRINE (PF) 1 %-1:200000 IJ SOLN
INTRAMUSCULAR | Status: DC | PRN
  Administered 2018-09-20: 7 mL

## 2018-09-20 MED ORDER — THROMBIN 5000 UNITS EX SOLR
CUTANEOUS | Status: AC
  Filled 2018-09-20: qty 5000

## 2018-09-20 MED ORDER — LIDOCAINE HCL (PF) 2 % IJ SOLN
INTRAMUSCULAR | Status: AC
  Filled 2018-09-20: qty 10

## 2018-09-20 MED ORDER — ONDANSETRON HCL 4 MG/2ML IJ SOLN
INTRAMUSCULAR | Status: DC | PRN
  Administered 2018-09-20: 4 mg via INTRAVENOUS

## 2018-09-20 MED ORDER — PROMETHAZINE HCL 25 MG/ML IJ SOLN: 6.2500 mg | Freq: Once | INTRAMUSCULAR | Status: DC

## 2018-09-20 MED ORDER — FAMOTIDINE 20 MG PO TABS
20.0000 mg | ORAL_TABLET | Freq: Once | ORAL | Status: AC
  Administered 2018-09-20: 06:00:00 20 mg via ORAL

## 2018-09-20 MED ORDER — EPHEDRINE SULFATE 50 MG/ML IJ SOLN
INTRAMUSCULAR | Status: DC | PRN
  Administered 2018-09-20 (×4): 5 mg via INTRAVENOUS

## 2018-09-20 MED ORDER — THROMBIN 5000 UNITS EX SOLR
CUTANEOUS | Status: DC | PRN
  Administered 2018-09-20: 5000 [IU] via TOPICAL

## 2018-09-20 MED ORDER — CLINDAMYCIN PHOSPHATE 600 MG/50ML IV SOLN
INTRAVENOUS | Status: AC
  Filled 2018-09-20: qty 50

## 2018-09-20 MED ORDER — LIDOCAINE-EPINEPHRINE (PF) 1 %-1:200000 IJ SOLN
INTRAMUSCULAR | Status: AC
  Filled 2018-09-20: qty 30

## 2018-09-20 MED ORDER — METHYLPREDNISOLONE ACETATE 40 MG/ML IJ SUSP
INTRAMUSCULAR | Status: DC | PRN
  Administered 2018-09-20: 40 mg

## 2018-09-20 MED ORDER — SODIUM CHLORIDE FLUSH 0.9 % IV SOLN
INTRAVENOUS | Status: AC
  Filled 2018-09-20: qty 20

## 2018-09-20 MED ORDER — LIDOCAINE HCL (CARDIAC) PF 100 MG/5ML IV SOSY
PREFILLED_SYRINGE | INTRAVENOUS | Status: DC | PRN
  Administered 2018-09-20: 100 mg via INTRAVENOUS

## 2018-09-20 MED ORDER — GLYCOPYRROLATE 0.2 MG/ML IJ SOLN
INTRAMUSCULAR | Status: DC | PRN
  Administered 2018-09-20: 0.2 mg via INTRAVENOUS

## 2018-09-20 MED ORDER — METHOCARBAMOL 500 MG PO TABS: 500.0000 mg | ORAL_TABLET | Freq: Four times a day (QID) | ORAL | 0 refills | Status: DC | PRN

## 2018-09-20 MED ORDER — CLINDAMYCIN PHOSPHATE 300 MG/50ML IV SOLN
INTRAVENOUS | Status: AC
  Filled 2018-09-20: qty 50

## 2018-09-20 MED ORDER — BACLOFEN 10 MG PO TABS: 10.0000 mg | ORAL_TABLET | Freq: Three times a day (TID) | ORAL | 0 refills | Status: DC

## 2018-09-20 MED ORDER — FENTANYL CITRATE (PF) 250 MCG/5ML IJ SOLN
INTRAMUSCULAR | Status: AC
  Filled 2018-09-20: qty 5

## 2018-09-20 MED ORDER — CLINDAMYCIN PHOSPHATE 300 MG/50ML IV SOLN
300.0000 mg | Freq: Once | INTRAVENOUS | Status: AC
  Administered 2018-09-20: 300 mg via INTRAVENOUS

## 2018-09-20 MED ORDER — PHENYLEPHRINE HCL (PRESSORS) 10 MG/ML IV SOLN
INTRAVENOUS | Status: DC | PRN
  Administered 2018-09-20 (×2): 50 ug via INTRAVENOUS
  Administered 2018-09-20 (×3): 100 ug via INTRAVENOUS

## 2018-09-20 MED ORDER — OXYCODONE HCL 5 MG PO TABS: 5.0000 mg | ORAL_TABLET | ORAL | 0 refills | Status: DC | PRN

## 2018-09-20 MED ORDER — PHENYLEPHRINE HCL (PRESSORS) 10 MG/ML IV SOLN
INTRAVENOUS | Status: AC
  Filled 2018-09-20: qty 1

## 2018-09-20 MED ORDER — BACITRACIN 50000 UNITS IM SOLR
INTRAMUSCULAR | Status: AC
  Filled 2018-09-20: qty 1

## 2018-09-20 MED ORDER — BUPIVACAINE HCL (PF) 0.5 % IJ SOLN
INTRAMUSCULAR | Status: AC
  Filled 2018-09-20: qty 30

## 2018-09-20 MED ORDER — PROPOFOL 10 MG/ML IV BOLUS
INTRAVENOUS | Status: AC
  Filled 2018-09-20: qty 20

## 2018-09-20 SURGICAL SUPPLY — 59 items
BASIN GRAD PLASTIC 32OZ STRL (MISCELLANEOUS) ×1 IMPLANT
BUR NEURO DRILL SOFT 3.0X3.8M (BURR) ×2 IMPLANT
CANISTER SUCT 1200ML W/VALVE (MISCELLANEOUS) ×4 IMPLANT
CHLORAPREP W/TINT 26 (MISCELLANEOUS) ×4 IMPLANT
COUNTER NEEDLE 20/40 LG (NEEDLE) ×2 IMPLANT
COVER LIGHT HANDLE STERIS (MISCELLANEOUS) ×4 IMPLANT
COVER WAND RF STERILE (DRAPES) ×2 IMPLANT
CUP MEDICINE 2OZ PLAST GRAD ST (MISCELLANEOUS) ×2 IMPLANT
DERMABOND ADVANCED (GAUZE/BANDAGES/DRESSINGS) ×1
DERMABOND ADVANCED .7 DNX12 (GAUZE/BANDAGES/DRESSINGS) ×1 IMPLANT
DRAPE C-ARM 42X72 X-RAY (DRAPES) ×4 IMPLANT
DRAPE LAPAROTOMY 100X77 ABD (DRAPES) ×2 IMPLANT
DRAPE MICROSCOPE SPINE 48X150 (DRAPES) IMPLANT
DRAPE SURG 17X11 SM STRL (DRAPES) ×2 IMPLANT
DRSG TEGADERM 4X4.75 (GAUZE/BANDAGES/DRESSINGS) IMPLANT
DRSG TELFA 4X3 1S NADH ST (GAUZE/BANDAGES/DRESSINGS) IMPLANT
DURASEAL APPLICATOR TIP (TIP) IMPLANT
DURASEAL SPINE SEALANT 3ML (MISCELLANEOUS) IMPLANT
ELECT CAUTERY BLADE TIP 2.5 (TIP) ×2
ELECT EZSTD 165MM 6.5IN (MISCELLANEOUS) ×2
ELECT REM PT RETURN 9FT ADLT (ELECTROSURGICAL) ×2
ELECTRODE CAUTERY BLDE TIP 2.5 (TIP) ×1 IMPLANT
ELECTRODE EZSTD 165MM 6.5IN (MISCELLANEOUS) ×1 IMPLANT
ELECTRODE REM PT RTRN 9FT ADLT (ELECTROSURGICAL) ×1 IMPLANT
GAUZE SPONGE 4X4 12PLY STRL (GAUZE/BANDAGES/DRESSINGS) ×2 IMPLANT
GLOVE BIOGEL PI IND STRL 7.0 (GLOVE) ×1 IMPLANT
GLOVE BIOGEL PI INDICATOR 7.0 (GLOVE) ×1
GLOVE INDICATOR 8.0 STRL GRN (GLOVE) ×6 IMPLANT
GLOVE SURG SYN 7.0 (GLOVE) ×4 IMPLANT
GLOVE SURG SYN 7.0 PF PI (GLOVE) ×2 IMPLANT
GLOVE SURG SYN 8.0 (GLOVE) ×6 IMPLANT
GLOVE SURG SYN 8.0 PF PI (GLOVE) ×1 IMPLANT
GOWN STRL REUS W/ TWL XL LVL3 (GOWN DISPOSABLE) ×1 IMPLANT
GOWN STRL REUS W/TWL MED LVL3 (GOWN DISPOSABLE) ×3 IMPLANT
GOWN STRL REUS W/TWL XL LVL3 (GOWN DISPOSABLE) ×1
GRADUATE 1200CC STRL 31836 (MISCELLANEOUS) ×2 IMPLANT
KIT TURNOVER KIT A (KITS) ×2 IMPLANT
KIT WILSON FRAME (KITS) ×2 IMPLANT
KNIFE BAYONET SHORT DISCETOMY (MISCELLANEOUS) IMPLANT
MARKER SKIN DUAL TIP RULER LAB (MISCELLANEOUS) ×4 IMPLANT
NDL SAFETY ECLIPSE 18X1.5 (NEEDLE) ×1 IMPLANT
NEEDLE HYPO 18GX1.5 SHARP (NEEDLE) ×1
NEEDLE HYPO 22GX1.5 SAFETY (NEEDLE) ×2 IMPLANT
NS IRRIG 1000ML POUR BTL (IV SOLUTION) ×2 IMPLANT
PACK LAMINECTOMY NEURO (CUSTOM PROCEDURE TRAY) ×2 IMPLANT
PAD ARMBOARD 7.5X6 YLW CONV (MISCELLANEOUS) ×2 IMPLANT
SPOGE SURGIFLO 8M (HEMOSTASIS) ×1
SPONGE SURGIFLO 8M (HEMOSTASIS) ×1 IMPLANT
STAPLER SKIN PROX 35W (STAPLE) IMPLANT
SUT NURALON 4 0 TR CR/8 (SUTURE) IMPLANT
SUT POLYSORB 2-0 5X18 GS-10 (SUTURE) ×2 IMPLANT
SUT VIC AB 0 CT1 18XCR BRD 8 (SUTURE) ×1 IMPLANT
SUT VIC AB 0 CT1 8-18 (SUTURE) ×1
SYR 10ML LL (SYRINGE) ×4 IMPLANT
SYR 30ML LL (SYRINGE) ×2 IMPLANT
SYR 3ML LL SCALE MARK (SYRINGE) ×3 IMPLANT
TOWEL OR 17X26 4PK STRL BLUE (TOWEL DISPOSABLE) ×8 IMPLANT
TUBE METRX 18MMX5CM (INSTRUMENTS) ×1 IMPLANT
TUBING CONNECTING 10 (TUBING) ×2 IMPLANT

## 2018-09-20 NOTE — Anesthesia Postprocedure Evaluation (Signed)
Anesthesia Post Note  Patient: Cresenciano Lick Mangano  Procedure(s) Performed: LUMBAR LAMINECTOMY/ DECOMPRESSION WITH MET-RX (Bilateral )  Patient location during evaluation: PACU Anesthesia Type: General Level of consciousness: awake and alert Pain management: pain level controlled Vital Signs Assessment: post-procedure vital signs reviewed and stable Respiratory status: spontaneous breathing, nonlabored ventilation, respiratory function stable and patient connected to nasal cannula oxygen Cardiovascular status: blood pressure returned to baseline and stable Postop Assessment: no apparent nausea or vomiting Anesthetic complications: no     Last Vitals:  Vitals:   09/20/18 1100 09/20/18 1135  BP: (!) 185/78 (!) 185/63  Pulse: 63 66  Resp: 18 18  Temp: (!) 36.2 C   SpO2: 95% 95%    Last Pain:  Vitals:   09/20/18 1135  TempSrc:   PainSc: 0-No pain                 Sharone Picchi S

## 2018-09-20 NOTE — OR Nursing (Signed)
Patient's blood pressure elevated. 180s/80s Dr. Marcello Moores notified and is aware patient's initial BP 203/91.  Patient up to bedside commode, dressed and she got nauseated, zofran given, backbrace applied. Patient doing well.

## 2018-09-20 NOTE — Discharge Instructions (Signed)

## 2018-09-20 NOTE — Anesthesia Post-op Follow-up Note (Signed)
Anesthesia QCDR form completed.        

## 2018-09-20 NOTE — Op Note (Signed)
Operative Note  SURGERY DATE: 09/20/2018  PRE-OP DIAGNOSIS: Lumbar Stenosis with Neurogenic Claudication  POST-OP DIAGNOSIS:Post-Op Diagnosis Codes: Lumbar Stenosis with Neurogenic Claudication  Procedure(s) with comments: L4/5Laminectomy  SURGEON:  * Malen Gauze, MD Marin Olp, PA Assistant  ANESTHESIA:General  OPERATIVE FINDINGS: Central stenosisat L4/5 due to hypertrophied ligamentum flavum  OPERATIVE REPORT:   Indication: Ms. Piperpresented to clinic on8/18withbilateral leg pain. MRI revealedseverestenosis compressingthe thecal sac.Therisks of surgery were explained to include hematoma, infection, damage to nerve roots, CSF leak, weakness, numbness, pain, need for future surgery including fusion, heart attack, and stroke.Sheelected to proceed with surgery for symptom relief.   Procedure The patient was brought to the OR after informed consent was obtained.She was given general anesthesia and intubated by the anesthesia service. Vascular access lines were placed.The patient was then placed prone on a Wilson frameensuring all pressure points were padded.Antibiotics were administered.A time-out was performed per protocol.   The patient was sterilely prepped and draped. Fluoroscopy  confirmed L4/5interspaceandthe incision wasopened1.5cm off midlineon the leftafter itwas instilled  withlocal anesthetic with epinephrine. The skin was opened sharply and the dissection taken to the fascia. This was incised and initial dilator placedthe spinous processes andlamina of L4on theleftout to the medial edge of the facet. Serial dilatorswere inserted via fluoroscopy and the final71mm tube was placed at depth of5cm.  The microscope was brought into the field. The overlying muscle was removed from lamina and medial facet.Next, a matchstickdrill bit was used to remove theinferior L4lamina centrallyand going laterally on both  sides.The underlying ligament was freed and removed with combination of rongeurs.This was followed to the top of L5 where the ligament attached. Once the central decompression was done, we moved to removal of ligament in the lateral recesses. The dura was indented but after ligament was removed, it was seen to expand. Once all ligament and soft tissue was removed, a blunt probe was used to ensure no remaining stenosis. Fluoroscopy confirmed appropriate level.  Multiple rounds of irrigation were used. Hemostasis was obtained.Depomedrol was placed along the nerve root.The microscope was removed.  Themuscle andfasciawas then closed using 0and 2-0vicryl followed by thesubcutaneous and dermal layers with 2-0 vicryluntil the epidermis was well approximated. The skin was closed withDermabond.  The patient was returned to supine position and extubated by the anesthesia service. The patient was then taken to the PACU for post-operative care wherehe was moving extremities symmetrically.   ESTIMATED BLOOD LOSS: 20cc  SPECIMENS None  IMPLANT None   I performed the case in its entiretywith assistance of PA, Corrie Mckusick, [place]

## 2018-09-20 NOTE — Transfer of Care (Signed)
Immediate Anesthesia Transfer of Care Note  Patient: Cresenciano Lick Goodroe  Procedure(s) Performed: LUMBAR LAMINECTOMY/ DECOMPRESSION WITH MET-RX (Bilateral )  Patient Location: PACU  Anesthesia Type:General  Level of Consciousness: awake and alert   Airway & Oxygen Therapy: Patient Spontanous Breathing and Patient connected to face mask oxygen  Post-op Assessment: Report given to RN and Post -op Vital signs reviewed and stable  Post vital signs: Reviewed and stable  Last Vitals:  Vitals Value Taken Time  BP 167/71 09/20/18 0943  Temp 36.7 C 09/20/18 0943  Pulse 80 09/20/18 0944  Resp 17 09/20/18 0944  SpO2 99 % 09/20/18 0944  Vitals shown include unvalidated device data.  Last Pain:  Vitals:   09/20/18 0609  PainSc: 5          Complications: No apparent anesthesia complications

## 2018-09-20 NOTE — H&P (Signed)
Kathleen Wallace is an 85 y.o. female.   Chief Complaint: Leg pain HPI: Kathleen Wallace is here to discuss ongoing symptoms of leg and back pain. After her last visit she was started on methocarbamol while she continue the Advil she says this started to upset her stomach so she stopped all the medication. Her stomach problems did improve but she has not started back any medication. She did attend physical therapy but this did not result in any relief. She has tried injections previously. Given this, she has returned to talk about possible surgical intervention. She does still state that the leg pain is 80% of her pain, more so than the back. She does feel like the right leg persist down the back of her leg towards the foot but the left leg is started to hurt down the posterior lateral side. MRI revealed stenosis at L4/5 and she would like to proceed with laminectomy for decompression   Past Medical History:  Diagnosis Date  . Cancer ([place])    skin cancer nose  . Complication of anesthesia    woke up during partial hysterectomy  . Elevated blood pressure   . GERD (gastroesophageal reflux disease)    occ-no meds  . Headache    h/o   . Heart murmur    asymptomatic    Past Surgical History:  Procedure Laterality Date  . ABDOMINAL HYSTERECTOMY  1974   partial  . BREAST SURGERY     IMPLANTS  . CATARACT EXTRACTION W/PHACO Right 02/16/2018   Procedure: CATARACT EXTRACTION PHACO AND INTRAOCULAR LENS PLACEMENT ([place]) RIGHT;  Surgeon: Marchia Meiers, MD;  Location: [facility] ORS;  Service: Ophthalmology;  Laterality: Right;  [place]  01:10 CDE 11.02 Fluid pack lot # [id number]  . CATARACT EXTRACTION W/PHACO Left 03/18/2018   Procedure: CATARACT EXTRACTION PHACO AND INTRAOCULAR LENS PLACEMENT ([place]) LEFT;  Surgeon: Marchia Meiers, MD;  Location: [facility] ORS;  Service: Ophthalmology;  Laterality: Left;  [place] 01:06.5 CDE 10.80 FLUID PACK LOT # [id number]  . TONSILLECTOMY AND ADENOIDECTOMY  1944    Family History  Problem  Relation Age of Onset  . Stroke Mother   . Hypertension Mother   . Stroke Father   . Hypertension Father   . Diabetes Other    Social History:  reports that she has never smoked. She has never used smokeless tobacco. She reports that she does not drink alcohol or use drugs.  Allergies:  Allergies  Allergen Reactions  . Codeine Nausea Only and Other (See Comments)    tachycardia  . Amoxicillin     insomnia Did it involve swelling of the face/tongue/throat, SOB, or low BP? No Did it involve sudden or severe rash/hives, skin peeling, or any reaction on the inside of your mouth or nose? No Did you need to seek medical attention at a hospital or doctor's office? No When did it last happen?years ago If all above answers are "NO", may proceed with cephalosporin use.     Medications Prior to Admission  Medication Sig Dispense Refill  . Coenzyme Q10 (COQ10) 100 MG CAPS Take 100 mg by mouth daily.     . dorzolamidel-timolol (COSOPT) 22.3-6.8 MG/ML SOLN ophthalmic solution Place 1 drop into both eyes 2 (two) times daily.    Marland Kitchen latanoprost (XALATAN) 0.005 % ophthalmic solution Place 1 drop into both eyes at bedtime.    . Multiple Vitamins-Minerals (MULTIVITAMIN WITH MINERALS) tablet Take 1 tablet by mouth daily.     . vitamin C (ASCORBIC ACID) 500 MG  tablet Take 500 mg by mouth daily.       No results found for this or any previous visit (from the past 48 hour(s)). No results found.  ROS General ROS: Negative Respiratory ROS: Negative Cardiovascular ROS: Negative Gastrointestinal ROS: Negative Genito-Urinary ROS: Negative Musculoskeletal ROS: Positive for back pain Neurological ROS: Positive for leg pain Dermatological ROS: Negative  Blood pressure (!) 210/93, pulse 73, temperature 97.6 F (36.4 C), SpO2 99 %. Physical Exam  General appearance: Alert, cooperative, in no acute distress Head: Normocephalic, atraumatic Eyes: Normal, EOM intact Oropharynx: Moist without  lesions Back: No tenderness to palpation of the midline or paramedian areas Ext: No edema in LE bilaterally, warm extremities  Neurologic exam:  Mental status: alertness: alert, affect: normal Speech: fluent and clear Cranial nerves:  V/VII:no evidence of facial droop or weakness VIII: hearing normal  Motor She is 5/5 strength in bilateral hip flexion, knee flexion extension, dorsiflexion, plantarflexion Sensory: intact to light touch in bilateral upper and lower extremities with exception of some loss of sensation over the lateral calf and foot Reflexes: 2+ and symmetric bilaterally for patella Coordination: intact finger to nose Gait: normal      MRI lumbar spine: There is a normal lordotic curvature with overall normal alignment with exception of a mild listhesis at L4-5. This does result in severe central stenosis at this level with facet hypertrophy and hypertrophy ligamentum flavum.   Assessment/Plan Plan is to proceed with L4/5 laminectomy for decompression  Deetta Perla, MD 09/20/2018, 6:42 AM

## 2018-09-20 NOTE — Discharge Summary (Signed)
  Procedure: L4-5 Laminectomy Procedure date: 09/20/2018 Diagnosis: Neurogenic claudication  History: Kathleen Wallace is s/p L4-5 laminectomy for neurogenic claudication.   POD0: Evaluated in postop recovery still disoriented from anesthesia but able to answer questions and obey commands.  Denies any lower extremity pain or back pain at this time.  Physical Exam: Vitals:   09/20/18 0609  BP: (!) 210/93  Pulse: 73  Temp: 97.6 F (36.4 C)  SpO2: 99%    Strength:5/5 throughout lower extremities bilaterally Sensation: Intact and symmetric throughout lower extremities bilaterally Skin: Glue intact at incision site  Data:  Recent Labs  Lab 09/14/18 1155  NA 139  K 4.7  CL 107  CO2 24  BUN 18  CREATININE 0.80  GLUCOSE 100*  CALCIUM 8.8*   No results for input(s): AST, ALT, ALKPHOS in the last 168 hours.  Invalid input(s): TBILI   Recent Labs  Lab 09/14/18 1155  WBC 8.3  HGB 12.7  HCT 37.6  PLT 310   Recent Labs  Lab 09/14/18 1155  APTT 26  INR 0.9         Other tests/results: No imaging reviewed  Assessment/Plan:  Kathleen Wallace is POD0 s/p L4-5 laminectomy for neurogenic cluadication.  Recovering well.  We will continue postop pain control with pain medication, muscle relaxer, Tylenol, and ibuprofen as needed.  She is scheduled to follow-up in clinic in approximately 2 weeks to monitor progress.  Marin Olp PA-C Department of Neurosurgery

## 2018-09-20 NOTE — Anesthesia Preprocedure Evaluation (Addendum)
Anesthesia Evaluation  Patient identified by MRN, date of birth, ID band Patient awake    Reviewed: Allergy & Precautions, NPO status , Patient's Chart, lab work & pertinent test results, reviewed documented beta blocker date and time   Airway Mallampati: II  TM Distance: >3 FB     Dental  (+) Chipped, Partial Lower   Pulmonary           Cardiovascular hypertension,      Neuro/Psych  Headaches,    GI/Hepatic GERD  Controlled,  Endo/Other    Renal/GU      Musculoskeletal   Abdominal   Peds  Hematology   Anesthesia Other Findings   Reproductive/Obstetrics                            Anesthesia Physical Anesthesia Plan  ASA: III  Anesthesia Plan: General   Post-op Pain Management:    Induction: Intravenous  PONV Risk Score and Plan:   Airway Management Planned: Oral ETT  Additional Equipment:   Intra-op Plan:   Post-operative Plan:   Informed Consent: I have reviewed the patients History and Physical, chart, labs and discussed the procedure including the risks, benefits and alternatives for the proposed anesthesia with the patient or authorized representative who has indicated his/her understanding and acceptance.       Plan Discussed with: CRNA  Anesthesia Plan Comments:         Anesthesia Quick Evaluation

## 2018-09-20 NOTE — Anesthesia Procedure Notes (Signed)
Procedure Name: Intubation Date/Time: 09/20/2018 7:33 AM Performed by: Rona Ravens, CRNA Pre-anesthesia Checklist: Patient identified, Emergency Drugs available, Suction available, Patient being monitored and Timeout performed Patient Re-evaluated:Patient Re-evaluated prior to induction Oxygen Delivery Method: Circle system utilized Preoxygenation: Pre-oxygenation with 100% oxygen Induction Type: IV induction and Rapid sequence Grade View: Grade II Tube type: Oral Tube size: 7.0 mm Number of attempts: 1 Airway Equipment and Method: Stylet Placement Confirmation: positive ETCO2,  ETT inserted through vocal cords under direct vision,  breath sounds checked- equal and bilateral and CO2 detector Secured at: 21 cm Tube secured with: Tape Dental Injury: Teeth and Oropharynx as per pre-operative assessment

## 2018-09-20 NOTE — Interval H&P Note (Signed)
History and Physical Interval Note:  09/20/2018 6:44 AM  Kathleen Wallace  has presented today for surgery, with the diagnosis of Lumbar stenosis m48.061, lumbar radiulopathy m54.16.  The various methods of treatment have been discussed with the patient and family. After consideration of risks, benefits and other options for treatment, the patient has consented to  Procedure(s): LUMBAR LAMINECTOMY/ DECOMPRESSION WITH MET-RX (Bilateral) as a surgical intervention.  The patient's history has been reviewed, patient examined, no change in status, stable for surgery.  I have reviewed the patient's chart and labs.  Questions were answered to the patient's satisfaction.     Deetta Perla

## 2018-12-06 DIAGNOSIS — H4089 Other specified glaucoma: Secondary | ICD-10-CM | POA: Diagnosis not present

## 2019-01-04 ENCOUNTER — Encounter (INDEPENDENT_AMBULATORY_CARE_PROVIDER_SITE_OTHER): Payer: Self-pay

## 2019-01-04 ENCOUNTER — Other Ambulatory Visit: Payer: Self-pay

## 2019-01-04 ENCOUNTER — Ambulatory Visit (INDEPENDENT_AMBULATORY_CARE_PROVIDER_SITE_OTHER): Payer: Medicare HMO

## 2019-01-04 DIAGNOSIS — Z0000 Encounter for general adult medical examination without abnormal findings: Secondary | ICD-10-CM | POA: Diagnosis not present

## 2019-01-04 NOTE — Progress Notes (Addendum)
Subjective:   Kathleen Wallace is a 85 y.o. female who presents for Medicare Annual (Subsequent) preventive examination.  Review of Systems:  No ROS.  Medicare Wellness Virtual Visit.  Visual/audio telehealth visit, UTA vital signs.   See social history for additional risk factors.   Cardiac Risk Factors include: advanced age (>85mn, >>64women);hypertension     Objective:     Vitals: There were no vitals taken for this visit.  There is no height or weight on file to calculate BMI.  Advanced Directives 01/04/2019 09/14/2018 03/18/2018 12/31/2017  Does Patient Have a Medical Advance Directive? Yes No No No  Type of AParamed[place]of AGreen [place]Living will - - -  Does patient want to make changes to medical advance directive? No - Patient declined - - -  Copy of [place]? Yes - validated most recent copy scanned in chart (See row information) - - -  Would patient like information on creating a medical advance directive? - - No - Patient declined No - Patient declined    Tobacco Social History   Tobacco Use  Smoking Status Never Smoker  Smokeless Tobacco Never Used     Counseling given: Not Answered   Clinical Intake:  Pre-visit preparation completed: Yes        Diabetes: No  How often do you need to have someone help you when you read instructions, pamphlets, or other written materials from your doctor or pharmacy?: 1 - Never  Interpreter Needed?: No     Past Medical History:  Diagnosis Date  . [place]    [place]  . Complication of anesthesia    woke up during partial hysterectomy  . Elevated blood pressure   . GERD (gastroesophageal reflux disease)    occ-no meds  . Headache    h/o   . Heart murmur    asymptomatic   Past Surgical History:  Procedure Laterality Date  . ABDOMINAL HYSTERECTOMY  1974   partial  . BREAST SURGERY     IMPLANTS  . CATARACT EXTRACTION W/PHACO Right 02/16/2018   Procedure: CATARACT EXTRACTION PHACO AND INTRAOCULAR LENS PLACEMENT ([place];  Surgeon: HMarchia Meiers MD;  Location: [facility] ORS;  Service: Ophthalmology;  Laterality: Right;  [place] 01:10 CDE 11.02 Fluid pack lot # [id number]  . CATARACT EXTRACTION W/PHACO Left 03/18/2018   Procedure: CATARACT EXTRACTION PHACO AND INTRAOCULAR LENS PLACEMENT ([place] LEFT;  Surgeon: HMarchia Meiers MD;  Location: [facility] ORS;  Service: Ophthalmology;  Laterality: Left;  [place]:06.5 CDE 10.80 FLUID PACK LOT # [id number]  . LUMBAR LAMINECTOMY/ DECOMPRESSION WITH MET-RX Bilateral 09/20/2018   Procedure: LUMBAR LAMINECTOMY/ DECOMPRESSION WITH MET-RX;  Surgeon: CDeetta Perla MD;  Location: [facility] ORS;  Service: Neurosurgery;  Laterality: Bilateral;  . TONSILLECTOMY AND ADENOIDECTOMY  1944   Family History  Problem Relation Age of Onset  . Stroke Mother   . Hypertension Mother   . Stroke Father   . Hypertension Father   . Diabetes Other    Social History   Socioeconomic History  . Marital status: Married    Spouse name: Not on file  . Number of children: Not on file  . Years of education: Not on file  . Highest education level: Not on file  Occupational History  . Not on file  Tobacco Use  . Smoking status: Never Smoker  . Smokeless tobacco: Never Used  Substance and Sexual Activity  . Alcohol use: No    Alcohol/week:  0.0 standard drinks  . Drug use: No  . Sexual activity: Not on file  Other Topics Concern  . Not on file  Social History Narrative  . Not on file   Social Determinants of Health   Financial Resource Strain:   . Difficulty of Paying Living Expenses: Not on file  Food Insecurity:   . Worried About Charity fundraiser in the Last Year: Not on file  . Ran Out of Food in the Last Year: Not on file  Transportation Needs:   . Lack of Transportation (Medical): Not on file  . Lack of Transportation (Non-Medical): Not on file  Physical Activity:   . Days of Exercise per Week: Not on file  . Minutes  of Exercise per Session: Not on file  Stress:   . Feeling of Stress : Not on file  Social Connections:   . Frequency of Communication with Friends and Family: Not on file  . Frequency of Social Gatherings with Friends and Family: Not on file  . Attends Religious Services: Not on file  . Active Member of Clubs or Organizations: Not on file  . Attends Archivist Meetings: Not on file  . Marital Status: Not on file    Outpatient Encounter Medications as of 01/04/2019  Medication Sig  . Coenzyme Q10 (COQ10) 100 MG CAPS Take 100 mg by mouth daily.   . dorzolamidel-timolol (COSOPT) 22.3-6.8 MG/ML SOLN ophthalmic solution Place 1 drop into both eyes 2 (two) times daily.  Marland Kitchen latanoprost (XALATAN) 0.005 % ophthalmic solution Place 1 drop into both eyes at bedtime.  . Multiple Vitamins-Minerals (MULTIVITAMIN WITH MINERALS) tablet Take 1 tablet by mouth daily.   . vitamin C (ASCORBIC ACID) 500 MG tablet Take 500 mg by mouth daily.   . [DISCONTINUED] baclofen (LIORESAL) 10 MG tablet Take 1 tablet (10 mg total) by mouth 3 (three) times daily.  . [DISCONTINUED] oxyCODONE (ROXICODONE) 5 MG immediate release tablet Take 1 tablet (5 mg total) by mouth every 4 (four) hours as needed for breakthrough pain.   No facility-administered encounter medications on file as of 01/04/2019.    Activities of Daily Living In your present state of health, do you have any difficulty performing the following activities: 01/04/2019 09/14/2018  Hearing? N N  Vision? N N  Difficulty concentrating or making decisions? N N  Walking or climbing stairs? N N  Dressing or bathing? N N  Doing errands, shopping? N N  Preparing Food and eating ? N -  Using the Toilet? N -  In the past six months, have you accidently leaked urine? N -  Do you have problems with loss of bowel control? N -  Managing your Medications? N -  Managing your Finances? N -  Housekeeping or managing your Housekeeping? N -  Some recent data  might be hidden    Patient Care Team: Einar Pheasant, MD as PCP - General (Internal Medicine)    Assessment:   This is a routine wellness examination for [facility].  Nurse connected with patient 01/04/19 at  9:30 AM EST by a telephone enabled telemedicine application and verified that I am speaking with the correct person using two identifiers. Patient stated full name and DOB. Patient gave permission to continue with virtual visit. Patient's location was at home and Nurse's location was at [place] office.   Patient is alert and oriented x3. Patient denies difficulty focusing or concentrating. Patient likes to read, pray and laugh for brain stimulation.   Health  Maintenance Due: -Dexa Scan- postponed per patient preference See completed HM at the end of note.   Eye: Visual acuity not assessed. Virtual visit. Followed by their ophthalmologist.  Dental: UTD  Hearing: Demonstrates normal hearing during visit.  Safety:  Patient feels safe at home- yes Patient does have smoke detectors at home- yes Patient does wear sunscreen or protective clothing when in direct sunlight - yes Patient does wear seat belt when in a moving vehicle - yes Patient drives- yes Adequate lighting in walkways free from debris- yes Grab bars and handrails used as appropriate- yes Ambulates with an assistive device- no Cell phone on person when ambulating outside of the home- yes  Social: Alcohol intake - no      Smoking history- never   Smokers in home? none Illicit drug use? none  Medication: Taking as directed and without issues.  Self managed - yes   Covid-19: Precautions and sickness symptoms discussed. Wears mask, social distancing, hand hygiene as appropriate.   Activities of Daily Living Patient denies needing assistance with: household chores, feeding themselves, getting from bed to chair, getting to the toilet, bathing/showering, dressing, managing money, or preparing meals.   Discussed  the importance of a healthy diet, water intake and the benefits of aerobic exercise.   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 DIET - INCREASE WATER INTAKE (pt-stated)     Stay hydrated    . Weight (lb) < 124 lb (56.2 kg) (pt-stated)     Maintain weight at 120lb Monitor sugar intake       Fall Risk Fall Risk  01/04/2019 12/31/2017 02/06/2017 08/01/2016 06/22/2015  Falls in the past year? 0 0 No No No  Follow up Falls prevention discussed - - - -   Timed Get Up and Go performed: no, virtual visit  Depression Screen PHQ 2/9 Scores 01/04/2019 12/31/2017 02/06/2017 08/01/2016  PHQ - 2 Score 0 0 0 0     Cognitive Function     6CIT Screen 01/04/2019 12/31/2017  What Year? 0 points 0 points  What month? 0 points 0 points  What time? 0 points 0 points  Count back from 20 0 points 0 points  Months in reverse 0 points 0 points  Repeat phrase 0 points 0 points  Total Score 0 0     There is no immunization history on file for this patient.  Screening Tests Health Maintenance  Topic Date Due  . DEXA SCAN  08/17/2000  . INFLUENZA VACCINE  04/20/2019 (Originally 08/21/2018)  . TETANUS/TDAP  01/04/2020 (Originally 08/18/1952)  . PNA vac Low Risk Adult (1 of 2 - PCV13) 01/04/2020 (Originally 08/17/2000)      Plan:   Keep all routine maintenance appointments.   Medicare Attestation I have personally reviewed: The patient's medical and social history Their use of alcohol, tobacco or illicit drugs Their current medications and supplements The patient's functional ability including ADLs,fall risks, home safety risks, cognitive, and hearing and visual impairment Diet and physical  activities Evidence for depression    I have reviewed and discussed with patient certain preventive protocols, quality metrics, and best practice recommendations.   Varney Biles, LPN  16/10/9602   Reviewed above information.  Agree with assessment and plan.    Dr Nicki Reaper

## 2019-01-04 NOTE — Patient Instructions (Addendum)
  Kathleen Wallace , Thank you for taking time to come for your Medicare Wellness Visit. I appreciate your ongoing commitment to your health goals. Please review the following plan we discussed and let me know if I can assist you in the future.   These are the goals we discussed: Goals      Patient Stated   . DIET - INCREASE WATER INTAKE (pt-stated)     Stay hydrated    . Weight (lb) < 124 lb (56.2 kg) (pt-stated)     Maintain weight at 120lb Monitor sugar intake       This is a list of the screening recommended for you and due dates:  Health Maintenance  Topic Date Due  . DEXA scan (bone density measurement)  08/17/2000  . Flu Shot  04/20/2019*  . Tetanus Vaccine  01/04/2020*  . Pneumonia vaccines (1 of 2 - PCV13) 01/04/2020*  *Topic was postponed. The date shown is not the original due date.

## 2019-01-05 ENCOUNTER — Ambulatory Visit: Payer: Medicare HMO

## 2019-01-05 ENCOUNTER — Ambulatory Visit: Payer: Medicare HMO | Admitting: Internal Medicine

## 2019-01-24 DIAGNOSIS — H4089 Other specified glaucoma: Secondary | ICD-10-CM | POA: Diagnosis not present

## 2019-02-23 DIAGNOSIS — H4089 Other specified glaucoma: Secondary | ICD-10-CM | POA: Diagnosis not present

## 2019-03-22 DIAGNOSIS — Z1283 Encounter for screening for malignant neoplasm of skin: Secondary | ICD-10-CM | POA: Diagnosis not present

## 2019-03-22 DIAGNOSIS — Z85828 Personal history of other malignant neoplasm of skin: Secondary | ICD-10-CM | POA: Diagnosis not present

## 2019-03-22 DIAGNOSIS — L821 Other seborrheic keratosis: Secondary | ICD-10-CM | POA: Diagnosis not present

## 2019-05-24 DIAGNOSIS — H4089 Other specified glaucoma: Secondary | ICD-10-CM | POA: Diagnosis not present

## 2019-07-01 DIAGNOSIS — H35372 Puckering of macula, left eye: Secondary | ICD-10-CM | POA: Diagnosis not present

## 2019-07-01 DIAGNOSIS — Z961 Presence of intraocular lens: Secondary | ICD-10-CM | POA: Diagnosis not present

## 2019-07-01 DIAGNOSIS — H4010X2 Unspecified open-angle glaucoma, moderate stage: Secondary | ICD-10-CM | POA: Diagnosis not present

## 2019-07-01 DIAGNOSIS — H3581 Retinal edema: Secondary | ICD-10-CM | POA: Diagnosis not present

## 2019-07-04 ENCOUNTER — Ambulatory Visit
Admission: RE | Admit: 2019-07-04 | Discharge: 2019-07-04 | Disposition: A | Discharge status: Home / Self Care | Payer: Medicare HMO | Source: Ambulatory Visit | Attending: Otolaryngology | Admitting: Otolaryngology

## 2019-07-04 ENCOUNTER — Other Ambulatory Visit: Payer: Self-pay | Admitting: Otolaryngology

## 2019-07-04 ENCOUNTER — Ambulatory Visit
Admission: RE | Admit: 2019-07-04 | Discharge: 2019-07-04 | Disposition: A | Discharge status: Home / Self Care | Payer: Medicare HMO | Attending: Otolaryngology | Admitting: Otolaryngology

## 2019-07-04 DIAGNOSIS — K219 Gastro-esophageal reflux disease without esophagitis: Secondary | ICD-10-CM | POA: Diagnosis not present

## 2019-07-04 DIAGNOSIS — R05 Cough: Secondary | ICD-10-CM | POA: Diagnosis not present

## 2019-07-04 DIAGNOSIS — R059 Cough, unspecified: Secondary | ICD-10-CM

## 2019-07-15 DIAGNOSIS — H35373 Puckering of macula, bilateral: Secondary | ICD-10-CM | POA: Diagnosis not present

## 2019-07-15 DIAGNOSIS — H4010X2 Unspecified open-angle glaucoma, moderate stage: Secondary | ICD-10-CM | POA: Diagnosis not present

## 2019-08-25 DIAGNOSIS — H35373 Puckering of macula, bilateral: Secondary | ICD-10-CM | POA: Diagnosis not present

## 2019-08-25 DIAGNOSIS — H43813 Vitreous degeneration, bilateral: Secondary | ICD-10-CM | POA: Diagnosis not present

## 2019-08-25 DIAGNOSIS — H4010X2 Unspecified open-angle glaucoma, moderate stage: Secondary | ICD-10-CM | POA: Diagnosis not present

## 2019-09-05 ENCOUNTER — Ambulatory Visit
Admission: EM | Admit: 2019-09-05 | Discharge: 2019-09-05 | Disposition: A | Discharge status: Home / Self Care | Payer: Medicare HMO | Attending: Physician Assistant | Admitting: Physician Assistant

## 2019-09-05 ENCOUNTER — Other Ambulatory Visit: Payer: Self-pay

## 2019-09-05 ENCOUNTER — Ambulatory Visit (INDEPENDENT_AMBULATORY_CARE_PROVIDER_SITE_OTHER): Payer: Medicare HMO

## 2019-09-05 DIAGNOSIS — R079 Chest pain, unspecified: Secondary | ICD-10-CM | POA: Diagnosis not present

## 2019-09-05 DIAGNOSIS — R062 Wheezing: Secondary | ICD-10-CM | POA: Diagnosis not present

## 2019-09-05 DIAGNOSIS — R9431 Abnormal electrocardiogram [ECG] [EKG]: Secondary | ICD-10-CM | POA: Diagnosis not present

## 2019-09-05 DIAGNOSIS — R0789 Other chest pain: Secondary | ICD-10-CM

## 2019-09-05 DIAGNOSIS — R21 Rash and other nonspecific skin eruption: Secondary | ICD-10-CM | POA: Diagnosis not present

## 2019-09-05 MED ORDER — ALBUTEROL SULFATE HFA 108 (90 BASE) MCG/ACT IN AERS: 1.0000 | INHALATION_SPRAY | RESPIRATORY_TRACT | 0 refills | Status: DC | PRN

## 2019-09-05 NOTE — Discharge Instructions (Addendum)
-  Chest x-ray is normal today -Ekg is abnormal. You may have a blockage in your heart. Your are not having pain currently and pain has not worsened recently so you do not need to go to the ED. If you do have increased chest pain, weakness, dizziness, nausea/vomiting, more shortness of breath, leg swelling, call EMS or have someone take you to the ED -Prop yourself up on pillows at night and take OTC Coricidin HBP cough medication -Call PCP in the morning for follow up appointment. You will likely need to see cardiology. You may also ask them for a referral to cardiology

## 2019-09-05 NOTE — ED Provider Notes (Signed)
[facility]    CSN: [id number] Arrival date & time: 09/05/19  1823      History   Chief Complaint Chief Complaint  Patient presents with  . Wheezing    HPI Kathleen Wallace is a 86 y.o. female.   86 y/o female presents for symptoms of wheezing x 5 months. She says that ever since she had her second COVID vaccine in March 2021, she has had wheezing and chest pressure. She says she only has wheezing when she lays down. She says the wheezing wakes her and her husband up at night. She says she also feels short of breath at times when she is outside caring for her lawn. 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 Also admits to breathing in various chemicals that she uses on the lawn. Admits to mild chest pain now (3/10). Denies SOB, sweats, palpitations, dizziness. Denies fever. Patient does admit to cough that has been intermittent since her COVID vaccinations. Cough is worse at night. Admits to increased fatigue over past several months.   BP elevated in clinic today at 181/83. Patient says she has white coat syndrome and readings are normal at home --140s/60s. She says she is not shocked the BP is high today.  Patient says she has not seen her PCP regarding these complaints. She says she tried to make an appointment today, but was advised to go to urgent care to get checked out sooner and then she can follow up with them. Patient denies any other concerns today.     Past Medical History:  Diagnosis Date  . Cancer ([place])    skin cancer nose  . Complication of anesthesia    woke up during partial hysterectomy  . Elevated blood pressure   . GERD (gastroesophageal reflux disease)    occ-no meds  . Headache    h/o   . Heart murmur    asymptomatic    Patient Active Problem List   Diagnosis Date Noted  . Low back pain 12/31/2017  . Thyroid mass 02/03/2016  . Health care maintenance 06/23/2015  . Essential hypertension 04/20/2014  . Dizziness  04/20/2014  . Shoulder pain 04/20/2014  . Stress 11/24/2013  . Sinusitis 11/24/2013  . Environmental allergies 08/03/2012  . Wheezing 08/03/2012  . Hypercholesterolemia 08/02/2012  . GERD (gastroesophageal reflux disease) 06/06/2012  . Heart murmur 06/06/2012    Past Surgical History:  Procedure Laterality Date  . ABDOMINAL HYSTERECTOMY  1974   partial  . BREAST SURGERY     IMPLANTS  . CATARACT EXTRACTION W/PHACO Right 02/16/2018   Procedure: CATARACT EXTRACTION PHACO AND INTRAOCULAR LENS PLACEMENT ([place]) RIGHT;  Surgeon: Marchia Meiers, MD;  Location: [facility] ORS;  Service: Ophthalmology;  Laterality: Right;  [place]  01:10 CDE 11.02 Fluid pack lot # [id number]  . CATARACT EXTRACTION W/PHACO Left 03/18/2018   Procedure: CATARACT EXTRACTION PHACO AND INTRAOCULAR LENS PLACEMENT ([place]) LEFT;  Surgeon: Marchia Meiers, MD;  Location: [facility] ORS;  Service: Ophthalmology;  Laterality: Left;  [place] 01:06.5 CDE 10.80 FLUID PACK LOT # [id number]  . LUMBAR LAMINECTOMY/ DECOMPRESSION WITH MET-RX Bilateral 09/20/2018   Procedure: LUMBAR LAMINECTOMY/ DECOMPRESSION WITH MET-RX;  Surgeon: Deetta Perla, MD;  Location: [facility] ORS;  Service: Neurosurgery;  Laterality: Bilateral;  . TONSILLECTOMY AND ADENOIDECTOMY  1944    OB History   No obstetric history on file.      Home Medications    Prior to Admission medications   Medication Sig Start Date End  Date Taking? Authorizing Provider  albuterol (VENTOLIN HFA) 108 (90 Base) MCG/ACT inhaler Inhale 1-2 puffs into the lungs every 4 (four) hours as needed for wheezing or shortness of breath. 09/05/19 10/05/19  Danton Clap, PA-C  Coenzyme Q10 (COQ10) 100 MG CAPS Take 100 mg by mouth daily.     [provider]  dorzolamidel-timolol (COSOPT) 22.3-6.8 MG/ML SOLN ophthalmic solution Place 1 drop into both eyes 2 (two) times daily.    [provider]  latanoprost (XALATAN) 0.005 % ophthalmic solution Place 1 drop into both eyes at bedtime.    [provider]  Multiple Vitamins-Minerals (MULTIVITAMIN WITH MINERALS) tablet Take 1 tablet by mouth daily.     [provider]  vitamin C (ASCORBIC ACID) 500 MG tablet Take 500 mg by mouth daily.     [provider]    Family History Family History  Problem Relation Age of Onset  . Stroke Mother   . Hypertension Mother   . Stroke Father   . Hypertension Father   . Diabetes Other     Social History Social History   Tobacco Use  . Smoking status: Never Smoker  . Smokeless tobacco: Never Used  Vaping Use  . Vaping Use: Never used  Substance Use Topics  . Alcohol use: No    Alcohol/week: 0.0 standard drinks  . Drug use: No     Allergies   Codeine and Amoxicillin   Review of Systems Review of Systems  Constitutional: Positive for fatigue. Negative for chills, diaphoresis and fever.  HENT: Negative for congestion, ear pain, rhinorrhea, sinus pressure, sinus pain and sore throat.   Respiratory: Positive for cough and wheezing. Negative for shortness of breath.   Cardiovascular: Positive for chest pain (mild, persistent x 5 months, no worse today than normally). Negative for palpitations and leg swelling.  Gastrointestinal: Negative for abdominal pain, nausea and vomiting.  Musculoskeletal: Negative for arthralgias and myalgias.  Skin: Negative for rash.  Neurological: Negative for dizziness, syncope, weakness, light-headedness, numbness and headaches.  Hematological: Negative for adenopathy.     Physical Exam Triage Vital Signs ED Triage Vitals  Enc Vitals Group     BP 09/05/19 1854 (!) 181/83     Pulse Rate 09/05/19 1854 85     Resp 09/05/19 1854 20     Temp 09/05/19 1854 98.2 F (36.8 C)     Temp Source 09/05/19 1854 Oral     SpO2 09/05/19 1854 97 %     Weight 09/05/19 1857 120 lb (54.4 kg)     Height 09/05/19 1857 '5\' 1"'  (1.549 m)     Head Circumference --      Peak Flow --      Pain Score 09/05/19 1857 3     Pain Loc --      Pain Edu?  --      Excl. in [place]? --    No data found.  Updated Vital Signs BP (!) 181/83 (BP Location: Left Arm)   Pulse 85   Temp 98.2 F (36.8 C) (Oral)   Resp 20   Ht '5\' 1"'  (1.549 m)   Wt 120 lb (54.4 kg)   SpO2 97%   BMI 22.67 kg/m      Physical Exam Vitals and nursing note reviewed.  Constitutional:      General: She is not in acute distress.    Appearance: Normal appearance. She is not ill-appearing or toxic-appearing.  HENT:     Head: Normocephalic and atraumatic.  Nose: Nose normal.     Mouth/Throat:     Mouth: Mucous membranes are moist.     Pharynx: Oropharynx is clear.  Eyes:     General: No scleral icterus.       Right eye: No discharge.        Left eye: No discharge.     Conjunctiva/sclera: Conjunctivae normal.  Cardiovascular:     Rate and Rhythm: Normal rate and regular rhythm.     Pulses: Normal pulses.     Heart sounds: Normal heart sounds. No murmur heard.   Pulmonary:     Effort: Pulmonary effort is normal. No respiratory distress.     Breath sounds: Wheezing (mild wheezing diffusely throughout bilateral chest) present. No rhonchi or rales.  Musculoskeletal:     Cervical back: Neck supple.     Right lower leg: No edema.     Left lower leg: No edema.     Comments: No calf tenderness, warmth, skin color changes  Skin:    General: Skin is dry.  Neurological:     General: No focal deficit present.     Mental Status: She is alert. Mental status is at baseline.     Motor: No weakness.     Gait: Gait normal.  Psychiatric:        Mood and Affect: Mood normal.        Behavior: Behavior normal.        Thought Content: Thought content normal.      UC Treatments / Results  Labs (all labs ordered are listed, but only abnormal results are displayed) Labs Reviewed - No data to display  EKG   Radiology DG Chest 2 View  Result Date: 09/05/2019 CLINICAL DATA:  86 year old female with wheezing. EXAM: CHEST - 2 VIEW COMPARISON:  Chest radiograph dated  07/04/2019 FINDINGS: No focal consolidation, pleural effusion, or pneumothorax. The cardiac silhouette is within limits. No acute osseous pathology. Bilateral breast implants with calcified capsules. IMPRESSION: No active cardiopulmonary disease. Electronically Signed   By: Anner Crete M.D.   On: 09/05/2019 19:46    Procedures ED EKG  Date/Time: 09/07/2019 4:10 PM Performed by: Danton Clap, PA-C Authorized by: Danton Clap, PA-C   ECG reviewed by ED Physician in the absence of a cardiologist: yes   Previous ECG:    Previous ECG:  Unavailable Interpretation:    Interpretation: abnormal   Rate:    ECG rate:  77   ECG rate assessment: normal   Rhythm:    Rhythm: sinus rhythm   Ectopy:    Ectopy: none   QRS:    QRS axis:  Left Conduction:    Conduction: abnormal     Abnormal conduction: incomplete LBBB   ST segments:    ST segments:  Normal T waves:    T waves: normal   Comments:     Normal sinus rhythm, Left axis deviation. LBBB   (including critical care time)  Medications Ordered in UC Medications - No data to display  Initial Impression / Assessment and Plan / UC Course  I have reviewed the triage vital signs and the nursing notes.  Pertinent labs & imaging results that were available during my care of the patient were reviewed by me and considered in my medical decision making (see chart for details).  Clinical Course as of Sep 06 1625  Wed Sep 07, 2019  1616 EKG 12-Lead [LE]    Clinical Course User Index [LE] Danton Clap, [place]  86 y/o female presents for 5 month history of cough, wheezing, chest pressure with onset after COVID vaccinations. She says she tried to make appointment with PCP but was referred to urgent care today. Patient denies recent worsening of condition or new symptoms.  BP elevated in clinic today at 181/83, admits to white coat hypertension. BP initially similar 11 months ago initially that came down to normal range .   Exam  reveals diffuse wheezing. No peripheral edema on exam. VSS other than elevated BP. O2 sat 97%.   Chest x-ray shows no acute cardiopulmonary disease.   EKG obtained today consistent with LBBB. Reviewed EKG from September 14, 2018 which cited septal infarct at that time. Discussed results of EKG with patient and explained that acute MI cannot be ruled out at this time because the EKG IS abnormal. Copy of EKG given to patient. She is stable and has had the the same persistent symptoms x 5 months. Advised that she needs cardiac enzymes performed, repeat EKGs and monitoring performed to rule out MI in the ED. Patient declines at this time and plans to follow up with PCP since condition is no worse today that it has been. Patient discharged in stable condition fully aware of risks to delaying workup and treatment. Also spoke with patient's husband.   Rx for albuterol inhaler given. Wheezing, cough, chest pressure could be due to reactive airway possibly due to chemical inhalation. Of course there are concerns about cardiac disease as well.   ED precautions discussed. Advised going to ED for worsening pain, dizziness, weakness, increased SOB, fatigue. Patient agreeable.    Final Clinical Impressions(s) / UC Diagnoses   Final diagnoses:  Wheezing  Chest tightness  Nonspecific abnormal electrocardiogram (ECG) (EKG)     Discharge Instructions     -Chest x-ray is normal today -Ekg is abnormal. You may have a blockage in your heart. Your are not having pain currently and pain has not worsened recently so you do not need to go to the ED. If you do have increased chest pain, weakness, dizziness, nausea/vomiting, more shortness of breath, leg swelling, call EMS or have someone take you to the ED -Prop yourself up on pillows at night and take OTC Coricidin HBP cough medication -Call PCP in the morning for follow up appointment. You will likely need to see cardiology. You may also ask them for a referral to  cardiology    ED Prescriptions    Medication Sig Dispense Auth. Provider   albuterol (VENTOLIN HFA) 108 (90 Base) MCG/ACT inhaler Inhale 1-2 puffs into the lungs every 4 (four) hours as needed for wheezing or shortness of breath. 6.7 g Danton Clap, PA-C     PDMP not reviewed this encounter.   Danton Clap, PA-C 09/07/19 1629

## 2019-09-05 NOTE — ED Triage Notes (Addendum)
Pt states she received her 2nd COVID vaccine in March and felt like something was bothering her chest and has been there since that time. Has progressively gotten worse to the point her chest always feels heavy and she wheezes. The wheezing wakes her up at night. She has noticed SOB with exertion and is coughing up some clear phlegm. 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 Pt was seen by ENT 3 months ago for similar and also had Xray done

## 2019-09-15 ENCOUNTER — Telehealth: Payer: Medicare HMO | Admitting: Internal Medicine

## 2019-09-15 DIAGNOSIS — Z0289 Encounter for other administrative examinations: Secondary | ICD-10-CM

## 2019-09-21 ENCOUNTER — Telehealth: Payer: Self-pay | Admitting: Internal Medicine

## 2019-09-21 DIAGNOSIS — R0602 Shortness of breath: Secondary | ICD-10-CM

## 2019-09-21 NOTE — Telephone Encounter (Signed)
From 09/05/19:  Discharge Instructions     -Chest x-ray is normal today -Ekg is abnormal. You may have a blockage in your heart. Your are not having pain currently and pain has not worsened recently so you do not need to go to the ED. If you do have increased chest pain, weakness, dizziness, nausea/vomiting, more shortness of breath, leg swelling, call EMS or have someone take you to the ED -Prop yourself up on pillows at night and take OTC Coricidin HBP cough medication -Call PCP in the morning for follow up appointment. You will likely need to see cardiology. You may also ask them for a referral to cardiology

## 2019-09-21 NOTE — Telephone Encounter (Signed)
Reviewed message.  Please call and confirm pt doing ok now.  Any symptoms now?  If any acute symptoms, needs to be evaluated.  I can place order for referral to cardiology.  Which cardiologist does she want to see?

## 2019-09-21 NOTE — Telephone Encounter (Signed)
Pt called and wanted to have Dr. Nicki Reaper schedule a Stress Test per UC  Pt also is scheduled an appt for 9/14

## 2019-09-21 NOTE — Telephone Encounter (Signed)
Patient is aware. Confirmed no acute symptoms. Patient does not have a preference which cardiologist she sees.

## 2019-09-22 NOTE — Addendum Note (Signed)
Addended by: Alisa Graff on: 09/22/2019 05:34 AM   Modules accepted: Orders

## 2019-09-22 NOTE — Telephone Encounter (Signed)
In reviewing chart, it appears she was seen in acute care on 09/05/19.  Per note review - noted wheezing.  cxr ok.  Recommended f/u with me/cardiology.  I have placed the order for the referral to cardiology as pt requested.  Given she was seen initially on 09/05/19 and called yesterday, need to confirm if any symptom change.  Is she noticing any wheezing, cough, congestion, sob, etc?

## 2019-09-22 NOTE — Telephone Encounter (Signed)
LMTCB

## 2019-09-28 NOTE — Telephone Encounter (Signed)
Patient confirmed nothing acute. Will discuss with Dr Nicki Reaper on 9/14 and she is seeing Dr Clayborn Bigness next week as well

## 2019-10-03 ENCOUNTER — Other Ambulatory Visit
Admission: RE | Admit: 2019-10-03 | Discharge: 2019-10-03 | Disposition: A | Discharge status: Home / Self Care | Payer: Medicare HMO | Source: Ambulatory Visit | Attending: Internal Medicine | Admitting: Internal Medicine

## 2019-10-03 DIAGNOSIS — Z79899 Other long term (current) drug therapy: Secondary | ICD-10-CM | POA: Insufficient documentation

## 2019-10-03 DIAGNOSIS — Z87898 Personal history of other specified conditions: Secondary | ICD-10-CM | POA: Diagnosis not present

## 2019-10-03 DIAGNOSIS — Z7689 Persons encountering health services in other specified circumstances: Secondary | ICD-10-CM | POA: Insufficient documentation

## 2019-10-03 DIAGNOSIS — I447 Left bundle-branch block, unspecified: Secondary | ICD-10-CM | POA: Diagnosis not present

## 2019-10-03 DIAGNOSIS — I509 Heart failure, unspecified: Secondary | ICD-10-CM | POA: Insufficient documentation

## 2019-10-03 DIAGNOSIS — I208 Other forms of angina pectoris: Secondary | ICD-10-CM | POA: Insufficient documentation

## 2019-10-03 DIAGNOSIS — Z789 Other specified health status: Secondary | ICD-10-CM | POA: Diagnosis not present

## 2019-10-03 DIAGNOSIS — R0602 Shortness of breath: Secondary | ICD-10-CM | POA: Diagnosis not present

## 2019-10-03 DIAGNOSIS — R5383 Other fatigue: Secondary | ICD-10-CM | POA: Diagnosis not present

## 2019-10-03 DIAGNOSIS — R0989 Other specified symptoms and signs involving the circulatory and respiratory systems: Secondary | ICD-10-CM | POA: Diagnosis not present

## 2019-10-03 LAB — FIBRIN DERIVATIVES D-DIMER (ARMC ONLY): Fibrin derivatives D-dimer (ARMC): 678.82 ng/mL (FEU) — ABNORMAL HIGH (ref 0.00–499.00)

## 2019-10-03 LAB — BRAIN NATRIURETIC PEPTIDE: B Natriuretic Peptide: 114.9 pg/mL — ABNORMAL HIGH (ref 0.0–100.0)

## 2019-10-04 ENCOUNTER — Encounter: Payer: Self-pay | Admitting: Internal Medicine

## 2019-10-04 ENCOUNTER — Telehealth (INDEPENDENT_AMBULATORY_CARE_PROVIDER_SITE_OTHER): Payer: Medicare HMO | Admitting: Internal Medicine

## 2019-10-04 DIAGNOSIS — R062 Wheezing: Secondary | ICD-10-CM

## 2019-10-04 DIAGNOSIS — R0602 Shortness of breath: Secondary | ICD-10-CM

## 2019-10-04 DIAGNOSIS — E7800 Pure hypercholesterolemia, unspecified: Secondary | ICD-10-CM

## 2019-10-04 DIAGNOSIS — I10 Essential (primary) hypertension: Secondary | ICD-10-CM

## 2019-10-04 NOTE — Progress Notes (Signed)
[name], female   DOB: Feb 13, 1933, 86 y.o.   MRN: [id number]   Virtual Visit via telephone Note  This visit type was conducted due to national recommendations for restrictions regarding the COVID-19 pandemic (e.g. social distancing).  This format is felt to be most appropriate for this patient at this time.  All issues noted in this document were discussed and addressed.  No physical exam was performed (except for noted visual exam findings with Video Visits).   I connected with Kathleen Wallace by telephone and verified that I am speaking with the correct person using two identifiers. Location patient: home Location provider: work Persons participating in the telephone visit: patient, provider  The limitations, risks, security and privacy concerns of performing an evaluation and management service by telephone and the availability of in person appointments have been discussed.   It has also been discussed with the patient that there may be a patient responsible charge related to this service. The patient expressed understanding and agreed to proceed.   Reason for visit: scheduled follow up.   HPI: Here for f/u.  Back surgery last year. Has done well since her surgery.  No back pain.  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  Still mowing.   States she got covid vaccine in 02/2019 and 03/2019.  After second vaccine - developed headaches.  Also felt a weight sitting on her chest.  Approximately 04/2019, started wheezing.  Wakes her up and keeps her awake.  Saw Dr Kathleen Wallace 06/2019 Treated with inhaler and oral medication.  She is not sure of the name of the medication.  Did not help.  Was sent over to get cxr.  CXR ok.  On 09/05/19 - evaluation at [facility]. EKG and cxr.  CXR ok.  EKG - changes - recommended f/u with cardiology.  States she will notice occasional chest tightness when working - if temperature is increased.  Saw Dr Kathleen Wallace yesterday.  States planning for stress test and echo.  Blood  pressure doing well - states was 122/72.  Pulse ox 97%.  She also describes intermittent episodes of wheezing and "allergy spells".  States first was in 2013.  Occurred 4-5 years later and the had another episode over this past week.  States palms will itch and then harder to breathe.  Has an appt with pulmonary this week.  Reports currently doing better and feels better.  Eating.  No nausea or vomiting.  Bowels moving.     ROS: See pertinent positives and negatives per HPI.  Past Medical History:  Diagnosis Date  . Cancer ([place])    skin cancer nose  . Complication of anesthesia    woke up during partial hysterectomy  . Elevated blood pressure   . GERD (gastroesophageal reflux disease)    occ-no meds  . Headache    h/o   . Heart murmur    asymptomatic    Past Surgical History:  Procedure Laterality Date  . ABDOMINAL HYSTERECTOMY  1974   partial  . BREAST SURGERY     IMPLANTS  . CATARACT EXTRACTION W/PHACO Right 02/16/2018   Procedure: CATARACT EXTRACTION PHACO AND INTRAOCULAR LENS PLACEMENT ([place]) RIGHT;  Surgeon: Marchia Meiers, MD;  Location: [facility] ORS;  Service: Ophthalmology;  Laterality: Right;  [place]  01:10 CDE 11.02 Fluid pack lot # [id number]  . CATARACT EXTRACTION W/PHACO Left 03/18/2018   Procedure: CATARACT EXTRACTION PHACO AND INTRAOCULAR LENS PLACEMENT ([place]) LEFT;  Surgeon: Marchia Meiers, MD;  Location: [facility] ORS;  Service: Ophthalmology;  Laterality: Left;  [place] 01:06.5 CDE 10.80 FLUID PACK LOT # [id number]  . LUMBAR LAMINECTOMY/ DECOMPRESSION WITH MET-RX Bilateral 09/20/2018   Procedure: LUMBAR LAMINECTOMY/ DECOMPRESSION WITH MET-RX;  Surgeon: Deetta Perla, MD;  Location: [facility] ORS;  Service: Neurosurgery;  Laterality: Bilateral;  . TONSILLECTOMY AND ADENOIDECTOMY  1944    Family History  Problem Relation Age of Onset  . Stroke Mother   . Hypertension Mother   . Stroke Father   . Hypertension Father   . Diabetes Other     SOCIAL HX: reviewed.    Current Outpatient  Medications:  .  albuterol (VENTOLIN HFA) 108 (90 Base) MCG/ACT inhaler, Inhale 1-2 puffs into the lungs every 4 (four) hours as needed for wheezing or shortness of breath., Disp: 6.7 g, Rfl: 0 .  Coenzyme Q10 (COQ10) 100 MG CAPS, Take 100 mg by mouth daily. , Disp: , Rfl:  .  dorzolamidel-timolol (COSOPT) 22.3-6.8 MG/ML SOLN ophthalmic solution, Place 1 drop into both eyes 2 (two) times daily., Disp: , Rfl:  .  fluticasone (FLOVENT HFA) 110 MCG/ACT inhaler, , Disp: , Rfl:  .  latanoprost (XALATAN) 0.005 % ophthalmic solution, Place 1 drop into both eyes at bedtime., Disp: , Rfl:  .  Multiple Vitamins-Minerals (MULTIVITAMIN WITH MINERALS) tablet, Take 1 tablet by mouth daily. , Disp: , Rfl:  .  ROCKLATAN 0.02-0.005 % SOLN, SMARTSIG:1 Drop(s) In Eye(s) Every Evening, Disp: , Rfl:  .  vitamin C (ASCORBIC ACID) 500 MG tablet, Take 500 mg by mouth daily. , Disp: , Rfl:   EXAM:  VITALS per patient if applicable: 239/53, pulse ox 97%, 120lbs, 89  GENERAL: alert.  Sounds to be in no acute distress.  Answering questions appropriately.    PSYCH/NEURO: pleasant and cooperative, no obvious depression or anxiety, speech and thought processing grossly intact  ASSESSMENT AND PLAN:  Discussed the following assessment and plan:  Wheezing Previously saw Dr [place] Bells.  Methacholine challenge test was previously negative.  Increased wheezing and symptoms as outlined.  She is using inhalers.  Has planned f/u with pulmonary this week.  Currently stable.  Follow.    Hypercholesterolemia Has declined cholesterol medication.  Low cholesterol diet and exercise.  Follow lipid panel.   Essential hypertension Blood pressure as outlined.  On no medication currently.  Follow pressures.  Follow metabolic panel.   SOB (shortness of breath) SOB with some reported intermittent chest tightness as outlined.  Pulmonary appt this week.  Just saw cardiology.  Planning for stress test and echo.  Currently doing better.   Follow.  Needs labs.     Orders Placed This Encounter  Procedures  . CBC with Differential/Platelet    Standing Status:   Future    Standing Expiration Date:   10/08/2020  . Comprehensive metabolic panel    Standing Status:   Future    Standing Expiration Date:   10/08/2020  . TSH    Standing Status:   Future    Standing Expiration Date:   10/08/2020  . Lipid panel    Standing Status:   Future    Standing Expiration Date:   10/08/2020     I discussed the assessment and treatment plan with the patient. The patient was provided an opportunity to ask questions and all were answered. The patient agreed with the plan and demonstrated an understanding of the instructions.   The patient was advised to call back or seek an in-person evaluation if the symptoms worsen or if the condition fails  to improve as anticipated.  I provided 25 minutes of non-face-to-face time during this encounter.   Einar Pheasant, MD

## 2019-10-05 DIAGNOSIS — J449 Chronic obstructive pulmonary disease, unspecified: Secondary | ICD-10-CM | POA: Diagnosis not present

## 2019-10-09 ENCOUNTER — Encounter: Payer: Self-pay | Admitting: Internal Medicine

## 2019-10-09 ENCOUNTER — Telehealth: Payer: Self-pay | Admitting: Internal Medicine

## 2019-10-09 DIAGNOSIS — R0602 Shortness of breath: Secondary | ICD-10-CM | POA: Insufficient documentation

## 2019-10-09 NOTE — Assessment & Plan Note (Signed)
Blood pressure as outlined.  On no medication currently.  Follow pressures.  Follow metabolic panel.

## 2019-10-09 NOTE — Assessment & Plan Note (Signed)
Previously saw Dr Lake Bells.  Methacholine challenge test was previously negative.  Increased wheezing and symptoms as outlined.  She is using inhalers.  Has planned f/u with pulmonary this week.  Currently stable.  Follow.

## 2019-10-09 NOTE — Telephone Encounter (Signed)
Please schedule f/u appt in 6-8 weeks and schedule fasting labs in 2-3 weeks.  Thanks

## 2019-10-09 NOTE — Assessment & Plan Note (Signed)
SOB with some reported intermittent chest tightness as outlined.  Pulmonary appt this week.  Just saw cardiology.  Planning for stress test and echo.  Currently doing better.  Follow.  Needs labs.

## 2019-10-09 NOTE — Assessment & Plan Note (Signed)
Has declined cholesterol medication.  Low cholesterol diet and exercise.  Follow lipid panel.   

## 2019-10-11 NOTE — Telephone Encounter (Signed)
Called to schedule pt labs and a follow up. She stated she will call back once she has surgery

## 2019-10-18 DIAGNOSIS — J45909 Unspecified asthma, uncomplicated: Secondary | ICD-10-CM | POA: Diagnosis not present

## 2019-10-26 DIAGNOSIS — H35372 Puckering of macula, left eye: Secondary | ICD-10-CM | POA: Diagnosis not present

## 2019-10-26 DIAGNOSIS — H33322 Round hole, left eye: Secondary | ICD-10-CM | POA: Diagnosis not present

## 2019-11-03 DIAGNOSIS — H43811 Vitreous degeneration, right eye: Secondary | ICD-10-CM | POA: Diagnosis not present

## 2019-11-03 DIAGNOSIS — H35372 Puckering of macula, left eye: Secondary | ICD-10-CM | POA: Diagnosis not present

## 2019-11-08 DIAGNOSIS — I208 Other forms of angina pectoris: Secondary | ICD-10-CM | POA: Diagnosis not present

## 2019-11-08 DIAGNOSIS — I509 Heart failure, unspecified: Secondary | ICD-10-CM | POA: Diagnosis not present

## 2019-11-14 DIAGNOSIS — R0602 Shortness of breath: Secondary | ICD-10-CM | POA: Diagnosis not present

## 2019-11-14 DIAGNOSIS — R5383 Other fatigue: Secondary | ICD-10-CM | POA: Diagnosis not present

## 2019-11-14 DIAGNOSIS — I208 Other forms of angina pectoris: Secondary | ICD-10-CM | POA: Diagnosis not present

## 2019-11-14 DIAGNOSIS — I509 Heart failure, unspecified: Secondary | ICD-10-CM | POA: Diagnosis not present

## 2019-11-14 DIAGNOSIS — Z23 Encounter for immunization: Secondary | ICD-10-CM | POA: Diagnosis not present

## 2019-11-14 DIAGNOSIS — J45909 Unspecified asthma, uncomplicated: Secondary | ICD-10-CM | POA: Diagnosis not present

## 2019-11-14 DIAGNOSIS — I447 Left bundle-branch block, unspecified: Secondary | ICD-10-CM | POA: Diagnosis not present

## 2019-11-14 DIAGNOSIS — Z87898 Personal history of other specified conditions: Secondary | ICD-10-CM | POA: Diagnosis not present

## 2019-11-14 DIAGNOSIS — R9439 Abnormal result of other cardiovascular function study: Secondary | ICD-10-CM | POA: Diagnosis not present

## 2019-11-14 DIAGNOSIS — R0989 Other specified symptoms and signs involving the circulatory and respiratory systems: Secondary | ICD-10-CM | POA: Diagnosis not present

## 2019-11-24 DIAGNOSIS — H43811 Vitreous degeneration, right eye: Secondary | ICD-10-CM | POA: Diagnosis not present

## 2019-12-19 ENCOUNTER — Telehealth: Payer: Self-pay

## 2019-12-19 ENCOUNTER — Encounter: Payer: Self-pay | Admitting: Emergency Medicine

## 2019-12-19 ENCOUNTER — Emergency Department
Admission: EM | Admit: 2019-12-19 | Discharge: 2019-12-19 | Disposition: A | Discharge status: Home / Self Care | Payer: Medicare HMO | Attending: Emergency Medicine | Admitting: Emergency Medicine

## 2019-12-19 ENCOUNTER — Emergency Department: Payer: Medicare HMO

## 2019-12-19 ENCOUNTER — Other Ambulatory Visit: Payer: Self-pay

## 2019-12-19 DIAGNOSIS — R519 Headache, unspecified: Secondary | ICD-10-CM | POA: Insufficient documentation

## 2019-12-19 DIAGNOSIS — I10 Essential (primary) hypertension: Secondary | ICD-10-CM | POA: Insufficient documentation

## 2019-12-19 DIAGNOSIS — R0602 Shortness of breath: Secondary | ICD-10-CM | POA: Diagnosis not present

## 2019-12-19 DIAGNOSIS — Z20822 Contact with and (suspected) exposure to covid-19: Secondary | ICD-10-CM | POA: Insufficient documentation

## 2019-12-19 DIAGNOSIS — Z85828 Personal history of other malignant neoplasm of skin: Secondary | ICD-10-CM | POA: Insufficient documentation

## 2019-12-19 DIAGNOSIS — J069 Acute upper respiratory infection, unspecified: Secondary | ICD-10-CM | POA: Diagnosis not present

## 2019-12-19 DIAGNOSIS — R079 Chest pain, unspecified: Secondary | ICD-10-CM | POA: Diagnosis not present

## 2019-12-19 DIAGNOSIS — R0789 Other chest pain: Secondary | ICD-10-CM | POA: Diagnosis present

## 2019-12-19 DIAGNOSIS — B9789 Other viral agents as the cause of diseases classified elsewhere: Secondary | ICD-10-CM | POA: Diagnosis not present

## 2019-12-19 LAB — BASIC METABOLIC PANEL
Anion gap: 9 (ref 5–15)
BUN: 14 mg/dL (ref 8–23)
CO2: 23 mmol/L (ref 22–32)
Calcium: 8.9 mg/dL (ref 8.9–10.3)
Chloride: 106 mmol/L (ref 98–111)
Creatinine, Ser: 0.77 mg/dL (ref 0.44–1.00)
GFR, Estimated: >60 mL/min (ref >60)
Glucose, Bld: 92 mg/dL (ref 70–99)
Potassium: 4.2 mmol/L (ref 3.5–5.1)
Sodium: 138 mmol/L (ref 135–145)

## 2019-12-19 LAB — CBC
HCT: 40.2 % (ref 36.0–46.0)
Hemoglobin: 13.9 g/dL (ref 12.0–15.0)
MCH: 32.4 pg (ref 26.0–34.0)
MCHC: 34.6 g/dL (ref 30.0–36.0)
MCV: 93.7 fL (ref 80.0–100.0)
Platelets: 245 10*3/uL (ref 150–400)
RBC: 4.29 MIL/uL (ref 3.87–5.11)
RDW: 13.5 % (ref 11.5–15.5)
WBC: 11.9 10*3/uL — ABNORMAL HIGH (ref 4.0–10.5)
nRBC: 0 % (ref 0.0–0.2)

## 2019-12-19 LAB — BRAIN NATRIURETIC PEPTIDE: B Natriuretic Peptide: 190.3 pg/mL — ABNORMAL HIGH (ref 0.0–100.0)

## 2019-12-19 LAB — RESP PANEL BY RT-PCR (FLU A&B, COVID) ARPGX2
Influenza A by PCR: NEGATIVE
Influenza B by PCR: NEGATIVE
SARS Coronavirus 2 by RT PCR: NEGATIVE

## 2019-12-19 LAB — TROPONIN I (HIGH SENSITIVITY): Troponin I (High Sensitivity): 8 ng/L (ref <18)

## 2019-12-19 MED ORDER — BENZONATATE 100 MG PO CAPS: 100.0000 mg | ORAL_CAPSULE | Freq: Two times a day (BID) | ORAL | 0 refills | Status: DC | PRN

## 2019-12-19 MED ORDER — ACETAMINOPHEN 500 MG PO TABS
1000.0000 mg | ORAL_TABLET | Freq: Once | ORAL | Status: AC
  Administered 2019-12-19: 1000 mg via ORAL
  Filled 2019-12-19: qty 2

## 2019-12-19 NOTE — ED Notes (Signed)
Patient ambulated to hallway bathroom with a steady gait. Patient states she will take her blood pressure at home and if it remains elevated, will call her PMD.

## 2019-12-19 NOTE — Telephone Encounter (Signed)
LMTCB

## 2019-12-19 NOTE — Telephone Encounter (Signed)
Patient currently in [facility] ED.    [facility] TELEPHONE ADVICE RECORD [facility] Patient Name: Kathleen Wallace Gender: Female DOB: 05-17-1933 Age: 86 Y 26 M Return Phone Number: [phone] (Primary), [phone] (Secondary), [phone] (Alternate) Address: City/State/Zip: [place] [address] Client [facility] [facility] Client Site [place] [facility] Physician Einar Pheasant - MD Contact Type Call Who Is Calling Patient / Member / Family / Caregiver Call Type Triage / Clinical Relationship To Patient Self Return Phone Number [phone] (Primary) Chief Complaint WHEEZING Reason for Call Symptomatic / Request for Health Information Initial Comment Office is transferring a pt with hx of wheezing/ breathing difficulty, clear, thick discharge from cough, headache and body aches. [place] Not Listed [facility] ER Translation No Nurse Assessment Nurse: Lucky Cowboy, RN, Levada Dy Date/Time (Eastern Time): 12/19/2019 8:49:24 AM Confirm and document reason for call. If symptomatic, describe symptoms. ---Caller stated that she has headache, body aches, chest heaviness, sore throat. She wonders if she has COVID. Temp normal. Does the patient have any new or worsening symptoms? ---Yes Will a triage be completed? ---Yes Related visit to physician within the last 2 weeks? ---No Does the PT have any chronic conditions? (i.e. diabetes, asthma, this includes High risk factors for pregnancy, etc.) ---No Is this a behavioral health or substance abuse call? ---No Guidelines Guideline Title Affirmed Question Affirmed Notes Nurse Date/Time (Eastern Time) COVID-19 - Diagnosed or Suspected SEVERE or constant chest pain or pressure (Exception: mild central chest pain, present only when coughing) Dew, RN, Levada Dy 12/19/2019 8:52:41 AM Disp. Time Eilene Ghazi Time) Disposition Final User 12/19/2019 8:45:21 AM  Send to Urgent Vassie Loll 12/19/2019 8:54:32 AM Go to ED Now Yes Dew, RN, Levada Dy PLEASE NOTE: All timestamps contained within this report are represented as [place] Standard Time. CONFIDENTIALTY NOTICE: This fax transmission is intended only for the addressee. It contains information that is legally privileged, confidential or otherwise protected from use or disclosure. If you are not the intended recipient, you are strictly prohibited from reviewing, disclosing, copying using or disseminating any of this information or taking any action in reliance on or regarding this information. If you have received this fax in error, please notify us immediately by telephone so that we can arrange for its return to [place]. Phone: [phone], Toll-Free: [phone], Fax: [phone] Page: 2 of 2 Call Id: [id number] [place] Disagree/Comply Comply Caller Understands Yes PreDisposition Call Doctor Care Advice Given Per Guideline GO TO ED NOW: * You need to be seen in the Emergency Department. * Go to the ED at ___________ [place] now. Drive carefully. ANOTHER ADULT SHOULD DRIVE: * It is better and safer if another adult drives instead of you. CALL EMS 911 IF: * Severe difficulty breathing occurs * Lips or face turns blue * Confusion occurs. CARE ADVICE given per COVID-19 - DIAGNOSED OR SUSPECTED (Adult) guideline. Referrals GO TO FACILITY OTHER - SPECIFY

## 2019-12-19 NOTE — Telephone Encounter (Signed)
Pt called in and said she is having shortness of breath, wheezing, coughing up clear thick mucus, headache, and just hurt all over. She told me she does have a history of shortness of breath and wheezing and is usually put on prednisone. To be safe I had patient sent to access nurse to be triaged.

## 2019-12-19 NOTE — ED Provider Notes (Signed)
[facility] Emergency Department Provider Note  ____________________________________________   First MD Initiated Contact with Patient 12/19/19 1142     (approximate)  I have reviewed the triage vital signs and the nursing notes.   HISTORY  Chief Complaint Chest Pain   HPI Kathleen Wallace is a 86 y.o. female with a past medical history of HTN, GERD, and HDL who presents for assessment of some substernal chest pressure associate with cough, congestion, and sore throat that began yesterday.  Patient also endorses mild headache.  She denies any fevers, chills, vomiting, diarrhea, dysuria, abdominal pain, back pain, rash or extremity pain.  She states she is coughing up phlegm but no blood.  Denies any significant shortness of breath except when she is coughing a significant amount.  She states only times before and shortly after she got her Covid vaccines but no other clear alleviating aggravating or precipitating events.         Past Medical History:  Diagnosis Date  . Cancer ([place])    skin cancer nose  . Complication of anesthesia    woke up during partial hysterectomy  . Elevated blood pressure   . GERD (gastroesophageal reflux disease)    occ-no meds  . Headache    h/o   . Heart murmur    asymptomatic    Patient Active Problem List   Diagnosis Date Noted  . SOB (shortness of breath) 10/09/2019  . Low back pain 12/31/2017  . Thyroid mass 02/03/2016  . Health care maintenance 06/23/2015  . Essential hypertension 04/20/2014  . Dizziness 04/20/2014  . Shoulder pain 04/20/2014  . Stress 11/24/2013  . Sinusitis 11/24/2013  . Environmental allergies 08/03/2012  . Wheezing 08/03/2012  . Hypercholesterolemia 08/02/2012  . GERD (gastroesophageal reflux disease) 06/06/2012  . Heart murmur 06/06/2012    Past Surgical History:  Procedure Laterality Date  . ABDOMINAL HYSTERECTOMY  1974   partial  . BREAST SURGERY     IMPLANTS  . CATARACT  EXTRACTION W/PHACO Right 02/16/2018   Procedure: CATARACT EXTRACTION PHACO AND INTRAOCULAR LENS PLACEMENT ([place]) RIGHT;  Surgeon: Marchia Meiers, MD;  Location: [facility] ORS;  Service: Ophthalmology;  Laterality: Right;  [place]  01:10 CDE 11.02 Fluid pack lot # [id number]  . CATARACT EXTRACTION W/PHACO Left 03/18/2018   Procedure: CATARACT EXTRACTION PHACO AND INTRAOCULAR LENS PLACEMENT ([place]) LEFT;  Surgeon: Marchia Meiers, MD;  Location: [facility] ORS;  Service: Ophthalmology;  Laterality: Left;  [place] 01:06.5 CDE 10.80 FLUID PACK LOT # [id number]  . LUMBAR LAMINECTOMY/ DECOMPRESSION WITH MET-RX Bilateral 09/20/2018   Procedure: LUMBAR LAMINECTOMY/ DECOMPRESSION WITH MET-RX;  Surgeon: Deetta Perla, MD;  Location: [facility] ORS;  Service: Neurosurgery;  Laterality: Bilateral;  . TONSILLECTOMY AND ADENOIDECTOMY  1944    Prior to Admission medications   Medication Sig Start Date End Date Taking? Authorizing Provider  albuterol (VENTOLIN HFA) 108 (90 Base) MCG/ACT inhaler Inhale 1-2 puffs into the lungs every 4 (four) hours as needed for wheezing or shortness of breath. 09/05/19 10/05/19  Danton Clap, PA-C  benzonatate (TESSALON PERLES) 100 MG capsule Take 1 capsule (100 mg total) by mouth 2 (two) times daily as needed for cough. 12/19/19 01/18/20  Lucrezia Starch, MD  Coenzyme Q10 (COQ10) 100 MG CAPS Take 100 mg by mouth daily.     [provider]  dorzolamidel-timolol (COSOPT) 22.3-6.8 MG/ML SOLN ophthalmic solution Place 1 drop into both eyes 2 (two) times daily.    [provider]  fluticasone (FLOVENT HFA) 110 MCG/ACT  inhaler  07/04/19   [provider]  latanoprost (XALATAN) 0.005 % ophthalmic solution Place 1 drop into both eyes at bedtime.    [provider]  Multiple Vitamins-Minerals (MULTIVITAMIN WITH MINERALS) tablet Take 1 tablet by mouth daily.     [provider]  ROCKLATAN 0.02-0.005 % SOLN SMARTSIG:1 Drop(s) In Eye(s) Every Evening 05/24/19   [provider]   vitamin C (ASCORBIC ACID) 500 MG tablet Take 500 mg by mouth daily.     [provider]    Allergies Codeine and Amoxicillin  Family History  Problem Relation Age of Onset  . Stroke Mother   . Hypertension Mother   . Stroke Father   . Hypertension Father   . Diabetes Other     Social History Social History   Tobacco Use  . Smoking status: Never Smoker  . Smokeless tobacco: Never Used  Vaping Use  . Vaping Use: Never used  Substance Use Topics  . Alcohol use: No    Alcohol/week: 0.0 standard drinks  . Drug use: No    Review of Systems  Review of Systems  Constitutional: Positive for malaise/fatigue. Negative for chills and fever.  HENT: Positive for congestion and sore throat.   Eyes: Negative for pain.  Respiratory: Positive for cough. Negative for stridor.   Cardiovascular: Positive for chest pain.  Gastrointestinal: Negative for vomiting.  Skin: Negative for rash.  Neurological: Positive for headaches. Negative for seizures and loss of consciousness.  Psychiatric/Behavioral: Negative for suicidal ideas.  All other systems reviewed and are negative.     ____________________________________________   PHYSICAL EXAM:  VITAL SIGNS: ED Triage Vitals  Enc Vitals Group     BP 12/19/19 1105 (!) 188/72     Pulse Rate 12/19/19 1105 87     Resp 12/19/19 1105 18     Temp 12/19/19 1105 97.7 F (36.5 C)     Temp Source 12/19/19 1105 Oral     SpO2 12/19/19 1105 97 %     Weight 12/19/19 1106 119 lb 14.9 oz (54.4 kg)     Height 12/19/19 1106 5' (1.524 m)     Head Circumference --      Peak Flow --      Pain Score 12/19/19 1106 3     Pain Loc --      Pain Edu? --      Excl. in [place]? --    Vitals:   12/19/19 1225 12/19/19 1249  BP: (!) 176/66 (!) 175/65  Pulse: 70 65  Resp: 16 16  Temp:    SpO2: 97% 96%   Physical Exam Vitals and nursing note reviewed.  Constitutional:      General: She is not in acute distress.    Appearance: She is  well-developed.  HENT:     Head: Normocephalic and atraumatic.     Right Ear: External ear normal.     Left Ear: External ear normal.     Nose: Nose normal.     Mouth/Throat:     Mouth: Mucous membranes are moist.  Eyes:     Conjunctiva/sclera: Conjunctivae normal.  Cardiovascular:     Rate and Rhythm: Normal rate and regular rhythm.     Heart sounds: No murmur heard.   Pulmonary:     Effort: Pulmonary effort is normal. No respiratory distress.     Breath sounds: Normal breath sounds.  Abdominal:     Palpations: Abdomen is soft.     Tenderness: There is no abdominal  tenderness.  Musculoskeletal:     Cervical back: Neck supple.  Skin:    General: Skin is warm and dry.     Capillary Refill: Capillary refill takes less than 2 seconds.  Neurological:     Mental Status: She is alert and oriented to person, place, and time.  Psychiatric:        Mood and Affect: Mood normal.     PERRLA.  EOMI.  Patient is full symmetric strength in all extremities. ____________________________________________   LABS (all labs ordered are listed, but only abnormal results are displayed)  Labs Reviewed  CBC - Abnormal; Notable for the following components:      Result Value   WBC 11.9 (*)    All other components within normal limits  BRAIN NATRIURETIC PEPTIDE - Abnormal; Notable for the following components:   B Natriuretic Peptide 190.3 (*)    All other components within normal limits  RESP PANEL BY RT-PCR (FLU A&B, COVID) ARPGX2  BASIC METABOLIC PANEL  TROPONIN I (HIGH SENSITIVITY)   ____________________________________________  EKG  Sinus rhythm with a ventricular rate of 79, left bundle branch block, prolonged QTc interval at 509, normal axis, and some nonspecific ST changes in inferior leads with appearance of ischemia.  This ECG is compared to that obtained on 8/16 that showed evidence of left axis deviation, left bundle branch block and similar nonspecific ST changes in inferior  leads. ____________________________________________  RADIOLOGY  ED MD interpretation: No focal consolidation, overt edema, large effusion, or other acute intrathoracic process.  Official radiology report(s): DG Chest 2 View  Result Date: 12/19/2019 CLINICAL DATA:  Chest pain EXAM: CHEST - 2 VIEW COMPARISON:  09/05/2019 FINDINGS: No new consolidation or edema. No pleural effusion or pneumothorax. Stable cardiomediastinal contours with normal heart size. No acute osseous abnormality. Chronic calcification of breast implants. IMPRESSION: No acute process in the chest. Electronically Signed   By: Macy Mis M.D.   On: 12/19/2019 12:03    ____________________________________________   PROCEDURES  Procedure(s) performed (including Critical Care):  Procedures   ____________________________________________   INITIAL IMPRESSION / ASSESSMENT AND PLAN / ED COURSE        Patient presents with [place] to history exam for assessment of cough, congestion, sore throat, some substernal chest pressure last 24 hours.  Patient is afebrile otherwise slightly hypertensive on arrival.  Upon recheck her BP was noted be 175/65 but she states this is very typical from emergency room.  Overall impression is likely viral bronchitis and pharyngitis.  Patient has nonfocal neuro exam and a very low suspicion for CVA or deep space infection of the head or neck at this time.  Chest x-ray shows no evidence of pneumonia and patient has no findings on oropharyngeal exam to suggest PTA or tonsillitis.  ECG does show some nonspecific findings but is unchanged when compared to prior and given nonelevated opponent low suspicion for ACS at this time.  BMP shows no significant electrolyte or metabolic derangements.  Troponin is nonelevated.  CBC shows a mild leukocytosis at 11.9 with no significant derangements.  BNP is slightly elevated 190 but overall patient does not appear volume overloaded on exam or chest x-ray that  was suspicion for acute heart failure at this time.  Covid PCR was sent.  Advised patient of expected clinical course and patient was discharged after I discussed strict return precautions and plan for close outpatient PCP follow-up.  Patient voiced understanding and agreed with this plan.  Discharged stable condition.  ____________________________________________  FINAL CLINICAL IMPRESSION(S) / ED DIAGNOSES  Final diagnoses:  Viral URI with cough    Medications  acetaminophen (TYLENOL) tablet 1,000 mg (1,000 mg Oral Given 12/19/19 1214)     ED Discharge Orders         Ordered    benzonatate (TESSALON PERLES) 100 MG capsule  2 times daily PRN        12/19/19 1251           Note:  This document was prepared using Dragon voice recognition software and may include unintentional dictation errors.   Lucrezia Starch, MD 12/19/19 1254

## 2019-12-19 NOTE — Telephone Encounter (Signed)
Pt would like to know if Dr Nicki Reaper could prescribe her Prednisone. Please see notes from [facility] ED. Pt states that it usually helps with wheezing. She would also like the results of her covid test. Please advise

## 2019-12-19 NOTE — ED Triage Notes (Signed)
Patient to ER for c/o chest heaviness since last March after 2nd dose of Covid vaccine. Patient states chest pain has been "acting up" last couple of days. +Mild shortness of breath.

## 2019-12-20 NOTE — Telephone Encounter (Signed)
Pt returned your call. She would like a call back.

## 2019-12-20 NOTE — Telephone Encounter (Signed)
Patient was calling back to let me know that she doesn't think she needs the prednisone anymore. She woke up this morning feeling some better and not wheezing anymore. She was requesting prednisone because it helped with her wheezing the last time she was sick. She was given tessalon perles when she was at urgent care. She is going to pick those up and use them. Advised to let us know if she needs anything.

## 2019-12-22 NOTE — Telephone Encounter (Signed)
Spoke with patients husband. He states her cough has became deep and seems to be getting worse. Tessalon perles are not helping. They are going to [facility] urgent care for her to be re-evaluated.

## 2019-12-22 NOTE — Telephone Encounter (Signed)
pt husband called he is wanting to know what else they can do  She is still coughing a lot and is congested and wheezing  Please call [phone]

## 2019-12-23 ENCOUNTER — Other Ambulatory Visit: Payer: Self-pay

## 2019-12-23 ENCOUNTER — Encounter: Payer: Self-pay | Admitting: Emergency Medicine

## 2019-12-23 ENCOUNTER — Ambulatory Visit
Admission: EM | Admit: 2019-12-23 | Discharge: 2019-12-23 | Disposition: A | Discharge status: Home / Self Care | Payer: Medicare HMO | Attending: Family Medicine | Admitting: Family Medicine

## 2019-12-23 DIAGNOSIS — J45901 Unspecified asthma with (acute) exacerbation: Secondary | ICD-10-CM

## 2019-12-23 DIAGNOSIS — J0100 Acute maxillary sinusitis, unspecified: Secondary | ICD-10-CM

## 2019-12-23 MED ORDER — DOXYCYCLINE HYCLATE 100 MG PO CAPS: 100.0000 mg | ORAL_CAPSULE | Freq: Two times a day (BID) | ORAL | 0 refills | Status: DC

## 2019-12-23 MED ORDER — ALBUTEROL SULFATE HFA 108 (90 BASE) MCG/ACT IN AERS
1.0000 | INHALATION_SPRAY | Freq: Four times a day (QID) | RESPIRATORY_TRACT | 0 refills | Status: DC | PRN
Start: 2019-12-23 — End: 2020-02-13

## 2019-12-23 MED ORDER — PREDNISONE 50 MG PO TABS: ORAL_TABLET | ORAL | 0 refills | Status: DC

## 2019-12-23 NOTE — ED Triage Notes (Signed)
Patient c/o chest congestion, runny nose and congestion that started on Sunday.  Patient was seen at [facility] ED on 12/19/19 for URI symptoms.  Patient's COVID and Flu test were negative on 12/19/19. Patient denies fevers.

## 2019-12-23 NOTE — Discharge Instructions (Signed)
Medications as prescribed.  Follow up with Dr. Nicki Reaper  Take care  Dr. Lacinda Axon

## 2019-12-23 NOTE — ED Provider Notes (Addendum)
[facility]    CSN: [id number] Arrival date & time: 12/23/19  9357  History   Chief Complaint Chief Complaint  Patient presents with  . Cough  . Nasal Congestion   HPI  86 year old female presents for evaluation of respiratory symptoms.   Patient reports that she has been sick since Sunday.  She has had ongoing cough, sore throat, chest congestion, sinus pain and pressure.  No documented fever.  She was instructed to go to the ER by her primary care physician.  She was seen on 11/29.  Negative Covid test as well as flu test at the ER.  Also had a chest x-ray which was negative.  Mild leukocytosis.  Sore throat has resolved.  She is now bothered mostly by sinus pain and pressure as well as congestion.  She also complains of chest discomfort and tightness.  Pain 7/10 in severity.  No other reported symptoms.  No other complaints.  Past Medical History:  Diagnosis Date  . Cancer ([place])    skin cancer nose  . Complication of anesthesia    woke up during partial hysterectomy  . Elevated blood pressure   . GERD (gastroesophageal reflux disease)    occ-no meds  . Headache    h/o   . Heart murmur    asymptomatic   Patient Active Problem List   Diagnosis Date Noted  . SOB (shortness of breath) 10/09/2019  . Low back pain 12/31/2017  . Thyroid mass 02/03/2016  . Health care maintenance 06/23/2015  . Essential hypertension 04/20/2014  . Dizziness 04/20/2014  . Shoulder pain 04/20/2014  . Stress 11/24/2013  . Sinusitis 11/24/2013  . Environmental allergies 08/03/2012  . Wheezing 08/03/2012  . Hypercholesterolemia 08/02/2012  . GERD (gastroesophageal reflux disease) 06/06/2012  . Heart murmur 06/06/2012   Past Surgical History:  Procedure Laterality Date  . ABDOMINAL HYSTERECTOMY  1974   partial  . BREAST SURGERY     IMPLANTS  . CATARACT EXTRACTION W/PHACO Right 02/16/2018   Procedure: CATARACT EXTRACTION PHACO AND INTRAOCULAR LENS PLACEMENT ([place]) RIGHT;   Surgeon: Marchia Meiers, MD;  Location: [facility] ORS;  Service: Ophthalmology;  Laterality: Right;  [place]  01:10 CDE 11.02 Fluid pack lot # [id number]  . CATARACT EXTRACTION W/PHACO Left 03/18/2018   Procedure: CATARACT EXTRACTION PHACO AND INTRAOCULAR LENS PLACEMENT ([place]) LEFT;  Surgeon: Marchia Meiers, MD;  Location: [facility] ORS;  Service: Ophthalmology;  Laterality: Left;  [place] 01:06.5 CDE 10.80 FLUID PACK LOT # [id number]  . LUMBAR LAMINECTOMY/ DECOMPRESSION WITH MET-RX Bilateral 09/20/2018   Procedure: LUMBAR LAMINECTOMY/ DECOMPRESSION WITH MET-RX;  Surgeon: Deetta Perla, MD;  Location: [facility] ORS;  Service: Neurosurgery;  Laterality: Bilateral;  . TONSILLECTOMY AND ADENOIDECTOMY  1944    OB History   No obstetric history on file.      Home Medications    Prior to Admission medications   Medication Sig Start Date End Date Taking? Authorizing Provider  Coenzyme Q10 (COQ10) 100 MG CAPS Take 100 mg by mouth daily.    Yes [provider]  dorzolamidel-timolol (COSOPT) 22.3-6.8 MG/ML SOLN ophthalmic solution Place 1 drop into both eyes 2 (two) times daily.   Yes [provider]  fluticasone (FLOVENT HFA) 110 MCG/ACT inhaler  07/04/19  Yes [provider]  latanoprost (XALATAN) 0.005 % ophthalmic solution Place 1 drop into both eyes at bedtime.   Yes [provider]  Multiple Vitamins-Minerals (MULTIVITAMIN WITH MINERALS) tablet Take 1 tablet by mouth daily.    Yes [provider]  ROCKLATAN 0.02-0.005 % SOLN SMARTSIG:1 Drop(s) In Eye(s) Every Evening 05/24/19  Yes [provider]  vitamin C (ASCORBIC ACID) 500 MG tablet Take 500 mg by mouth daily.    Yes [provider]  albuterol (VENTOLIN HFA) 108 (90 Base) MCG/ACT inhaler Inhale 1-2 puffs into the lungs every 6 (six) hours as needed for wheezing or shortness of breath. 12/23/19   Coral Spikes, DO  doxycycline (VIBRAMYCIN) 100 MG capsule Take 1 capsule (100 mg total) by mouth 2 (two) times daily.  12/23/19   Coral Spikes, DO  predniSONE (DELTASONE) 50 MG tablet 1 tablet daily x 5 days 12/23/19   Coral Spikes, DO    Family History Family History  Problem Relation Age of Onset  . Stroke Mother   . Hypertension Mother   . Stroke Father   . Hypertension Father   . Diabetes Other     Social History Social History   Tobacco Use  . Smoking status: Never Smoker  . Smokeless tobacco: Never Used  Vaping Use  . Vaping Use: Never used  Substance Use Topics  . Alcohol use: No    Alcohol/week: 0.0 standard drinks  . Drug use: No     Allergies   Codeine and Amoxicillin   Review of Systems Review of Systems  Constitutional: Negative for fever.  HENT: Positive for congestion, sinus pressure and sinus pain.   Respiratory: Positive for cough and chest tightness.    Physical Exam Triage Vital Signs ED Triage Vitals  Enc Vitals Group     BP 12/23/19 0912 (!) 172/74     Pulse Rate 12/23/19 0912 (!) 109     Resp 12/23/19 0912 14     Temp 12/23/19 0912 97.7 F (36.5 C)     Temp Source 12/23/19 0912 Oral     SpO2 12/23/19 0912 99 %     Weight 12/23/19 0910 120 lb (54.4 kg)     Height 12/23/19 0910 '5\' 1"'  (1.549 m)     Head Circumference --      Peak Flow --      Pain Score 12/23/19 0909 7     Pain Loc --      Pain Edu? --      Excl. in [place]? --    Updated Vital Signs BP (!) 172/74 (BP Location: Left Arm)   Pulse (!) 109   Temp 97.7 F (36.5 C) (Oral)   Resp 14   Ht '5\' 1"'  (1.549 m)   Wt 54.4 kg   SpO2 99%   BMI 22.67 kg/m   Visual Acuity Right Eye Distance:   Left Eye Distance:   Bilateral Distance:    Right Eye Near:   Left Eye Near:    Bilateral Near:     Physical Exam Vitals and nursing note reviewed.  Constitutional:      General: She is not in acute distress.    Appearance: Normal appearance. She is not ill-appearing.  HENT:     Head: Normocephalic and atraumatic.     Mouth/Throat:     Pharynx: Oropharynx is clear.  Eyes:     General:         Right eye: No discharge.        Left eye: No discharge.     Conjunctiva/sclera: Conjunctivae normal.  Cardiovascular:     Rate and Rhythm: Regular rhythm. Tachycardia present.  Pulmonary:     Effort: Pulmonary effort is normal.     Breath  sounds: Wheezing present.  Neurological:     Mental Status: She is alert.  Psychiatric:        Mood and Affect: Mood normal.        Behavior: Behavior normal.    UC Treatments / Results  Labs (all labs ordered are listed, but only abnormal results are displayed) Labs Reviewed - No data to display  EKG   Radiology No results found.  Procedures Procedures (including critical care time)  Medications Ordered in UC Medications - No data to display  Initial Impression / Assessment and Plan / UC Course  I have reviewed the triage vital signs and the nursing notes.  Pertinent labs & imaging results that were available during my care of the patient were reviewed by me and considered in my medical decision making (see chart for details).    86 year old female presents with respiratory symptoms.  Patient experiencing sinusitis.  This is also exacerbated her asthma as she is currently wheezing.  Placing on prednisone, albuterol.  Doxycycline for sinusitis.  Supportive care.  Final Clinical Impressions(s) / UC Diagnoses   Final diagnoses:  Acute maxillary sinusitis, recurrence not specified  Exacerbation of asthma, unspecified asthma severity, unspecified whether persistent     Discharge Instructions     Medications as prescribed.  Follow up with Dr. Nicki Reaper  Take care  Dr. Lacinda Axon     ED Prescriptions    Medication Sig [place]. Provider   predniSONE (DELTASONE) 50 MG tablet 1 tablet daily x 5 days 5 tablet Jovan Colligan G, DO   albuterol (VENTOLIN HFA) 108 (90 Base) MCG/ACT inhaler Inhale 1-2 puffs into the lungs every 6 (six) hours as needed for wheezing or shortness of breath. 18 g Jayveion Stalling G, DO   doxycycline (VIBRAMYCIN) 100  MG capsule Take 1 capsule (100 mg total) by mouth 2 (two) times daily. 14 capsule Thersa Salt G, DO     PDMP not reviewed this encounter.   Coral Spikes, [place] 12/23/19 1028

## 2020-01-05 ENCOUNTER — Ambulatory Visit (INDEPENDENT_AMBULATORY_CARE_PROVIDER_SITE_OTHER): Payer: Medicare HMO

## 2020-01-05 VITALS — Ht 61.0 in | Wt 120.0 lb

## 2020-01-05 DIAGNOSIS — Z0000 Encounter for general adult medical examination without abnormal findings: Secondary | ICD-10-CM

## 2020-01-05 NOTE — Progress Notes (Signed)
Subjective:   Kathleen Wallace is a 86 y.o. female who presents for Medicare Annual (Subsequent) preventive examination.  Review of Systems    No ROS.  Medicare Wellness Virtual Visit.    Cardiac Risk Factors include: advanced age (>33mn, >>29women);hypertension     Objective:    Today's Vitals   01/05/20 0939  Weight: 120 lb (54.4 kg)  Height: _0  (1.549 m)   Body mass index is 22.67 kg/m.  Advanced Directives 01/05/2020 12/23/2019 12/19/2019 09/05/2019 01/04/2019 09/14/2018 03/18/2018  Does Patient Have a Medical Advance Directive? Yes No Yes No Yes No No  Type of AParamedicof AContinental CourtsLiving will - Living will - HBeemerLiving will - -  Does patient want to make changes to medical advance directive? No - Patient declined - No - Patient declined - No - Patient declined - -  Copy of HBeein Chart? Yes - validated most recent copy scanned in chart (See row information) - - - Yes - validated most recent copy scanned in chart (See row information) - -  Would patient like information on creating a medical advance directive? - - - No - Patient declined - - No - Patient declined    Current Medications (verified) Outpatient Encounter Medications as of 01/05/2020  Medication Sig  . albuterol (VENTOLIN HFA) 108 (90 Base) MCG/ACT inhaler Inhale 1-2 puffs into the lungs every 6 (six) hours as needed for wheezing or shortness of breath.  . Coenzyme Q10 (COQ10) 100 MG CAPS Take 100 mg by mouth daily.   . dorzolamidel-timolol (COSOPT) 22.3-6.8 MG/ML SOLN ophthalmic solution Place 1 drop into both eyes 2 (two) times daily.  .Marland Kitchendoxycycline (VIBRAMYCIN) 100 MG capsule Take 1 capsule (100 mg total) by mouth 2 (two) times daily.  . fluticasone (FLOVENT HFA) 110 MCG/ACT inhaler   . latanoprost (XALATAN) 0.005 % ophthalmic solution Place 1 drop into both eyes at bedtime.  . Multiple Vitamins-Minerals (MULTIVITAMIN WITH MINERALS)  tablet Take 1 tablet by mouth daily.   . predniSONE (DELTASONE) 50 MG tablet 1 tablet daily x 5 days  . ROCKLATAN 0.02-0.005 % SOLN SMARTSIG:1 Drop(s) In Eye(s) Every Evening  . vitamin C (ASCORBIC ACID) 500 MG tablet Take 500 mg by mouth daily.    No facility-administered encounter medications on file as of 01/05/2020.    Allergies (verified) Codeine and Amoxicillin   History: Past Medical History:  Diagnosis Date  . Cancer ([place]    skin cancer nose  . Complication of anesthesia    woke up during partial hysterectomy  . Elevated blood pressure   . GERD (gastroesophageal reflux disease)    occ-no meds  . Headache    h/o   . Heart murmur    asymptomatic   Past Surgical History:  Procedure Laterality Date  . ABDOMINAL HYSTERECTOMY  1974   partial  . BREAST SURGERY     IMPLANTS  . CATARACT EXTRACTION W/PHACO Right 02/16/2018   Procedure: CATARACT EXTRACTION PHACO AND INTRAOCULAR LENS PLACEMENT ([place] RIGHT;  Surgeon: HMarchia Meiers MD;  Location: [facility] ORS;  Service: Ophthalmology;  Laterality: Right;  [place] 01:10 CDE 11.02 Fluid pack lot # [id number]  . CATARACT EXTRACTION W/PHACO Left 03/18/2018   Procedure: CATARACT EXTRACTION PHACO AND INTRAOCULAR LENS PLACEMENT ([place] LEFT;  Surgeon: HMarchia Meiers MD;  Location: [facility] ORS;  Service: Ophthalmology;  Laterality: Left;  [place]01:06.5 CDE 10.80 FLUID PACK LOT # [id number]  . LUMBAR LAMINECTOMY/ DECOMPRESSION WITH MET-RX  Bilateral 09/20/2018   Procedure: LUMBAR LAMINECTOMY/ DECOMPRESSION WITH MET-RX;  Surgeon: Deetta Perla, MD;  Location: [facility] ORS;  Service: Neurosurgery;  Laterality: Bilateral;  . TONSILLECTOMY AND ADENOIDECTOMY  1944   Family History  Problem Relation Age of Onset  . Stroke Mother   . Hypertension Mother   . Stroke Father   . Hypertension Father   . Diabetes Other    Social History   Socioeconomic History  . Marital status: Married    Spouse name: Not on file  . Number of children: Not on file  . Years of  education: Not on file  . Highest education level: Not on file  Occupational History  . Not on file  Tobacco Use  . Smoking status: Never Smoker  . Smokeless tobacco: Never Used  Vaping Use  . Vaping Use: Never used  Substance and Sexual Activity  . Alcohol use: No    Alcohol/week: 0.0 standard drinks  . Drug use: No  . Sexual activity: Not on file  Other Topics Concern  . Not on file  Social History Narrative  . Not on file   Social Determinants of Health   Financial Resource Strain: Low Risk   . Difficulty of Paying Living Expenses: Not hard at all  Food Insecurity: No Food Insecurity  . Worried About Charity fundraiser in the Last Year: Never true  . Ran Out of Food in the Last Year: Never true  Transportation Needs: No Transportation Needs  . Lack of Transportation (Medical): No  . Lack of Transportation (Non-Medical): No  Physical Activity: Not on file  Stress: No Stress Concern Present  . Feeling of Stress : Not at all  Social Connections: Unknown  . Frequency of Communication with Friends and Family: Not on file  . Frequency of Social Gatherings with Friends and Family: Not on file  . Attends Religious Services: Not on file  . Active Member of Clubs or Organizations: Not on file  . Attends Archivist Meetings: Not on file  . Marital Status: Married    Tobacco Counseling Counseling given: Not Answered   Clinical Intake:  Pre-visit preparation completed: Yes        Diabetes: No  How often do you need to have someone help you when you read instructions, pamphlets, or other written materials from your doctor or pharmacy?: 1 - Never   Interpreter Needed?: No      Activities of Daily Living In your present state of health, do you have any difficulty performing the following activities: 01/05/2020  Hearing? N  Vision? N  Difficulty concentrating or making decisions? N  Walking or climbing stairs? N  Dressing or bathing? N  Doing  errands, shopping? N  Preparing Food and eating ? N  Using the Toilet? N  In the past six months, have you accidently leaked urine? N  Do you have problems with loss of bowel control? N  Managing your Medications? N  Managing your Finances? N  Housekeeping or managing your Housekeeping? N  Some recent data might be hidden    Patient Care Team: Einar Pheasant, MD as PCP - General (Internal Medicine)  Indicate any recent Medical Services you may have received from other than [facility] providers in the past year (date may be approximate).     Assessment:   This is a routine wellness examination for [facility].  I connected with Karah today by telephone and verified that I am speaking with the correct person using two  identifiers. Location patient: home Location provider: work Persons participating in the virtual visit: patient, Marine scientist.    I discussed the limitations, risks, security and privacy concerns of performing an evaluation and management service by telephone and the availability of in person appointments. The patient expressed understanding and verbally consented to this telephonic visit.    Interactive audio and video telecommunications were attempted between this provider and patient, however failed, due to patient having technical difficulties OR patient did not have access to video capability.  We continued and completed visit with audio only.  Some vital signs may be absent or patient reported.   Hearing/Vision screen  Hearing Screening   _0  _1  _2  _3  _4  _5  _6  _7  _8   Right ear:           Left ear:           Comments: Patient is able to hear conversational tones without difficulty. No issues reported.  Vision Screening Comments: Wears corrective lenses  Visual acuity not assessed, virtual visit. They have seen their ophthalmologist in the last 12 months.  Dietary issues and exercise activities discussed: Current Exercise Habits: Home exercise  routine, Type of exercise: walking, Intensity: Mild   Regular diet Good water intake  Goals      Patient Stated   .  DIET - INCREASE WATER INTAKE (pt-stated)      Stay hydrated    .  Weight (lb) < 124 lb (56.2 kg) (pt-stated)      Maintain weight at 120lb Monitor sugar intake      Depression Screen PHQ 2/9 Scores 01/05/2020 10/04/2019 01/04/2019 12/31/2017 02/06/2017 08/01/2016 06/22/2015  PHQ - 2 Score 0 0 0 0 0 0 0    Fall Risk Fall Risk  01/05/2020 10/04/2019 01/04/2019 12/31/2017 02/06/2017  Falls in the past year? 0 0 0 0 No  Number falls in past yr: 0 0 - - -  Injury with Fall? 0 0 - - -  Follow up Falls evaluation completed Falls evaluation completed Falls prevention discussed - -    FALL RISK PREVENTION PERTAINING TO THE HOME: Handrails in use when climbing stairs? Yes Home free of loose throw rugs in walkways, pet beds, electrical cords, etc? Yes  Adequate lighting in your home to reduce risk of falls? Yes   ASSISTIVE DEVICES UTILIZED TO PREVENT FALLS: Use of a cane, walker or w/c? No   TIMED UP AND GO: Was the test performed? No . Virtual visit.   Cognitive Function:     6CIT Screen 01/05/2020 01/04/2019 12/31/2017  What Year? 0 points 0 points 0 points  What month? 0 points 0 points 0 points  What time? 0 points 0 points 0 points  Count back from 20 0 points 0 points 0 points  Months in reverse 0 points 0 points 0 points  Repeat phrase 0 points 0 points 0 points  Total Score 0 0 0    Immunizations Immunization History  Administered Date(s) Administered  . Moderna Sars-Covid-2 Vaccination 03/12/2019, 04/13/2019   Health Maintenance Health Maintenance  Topic Date Due  . INFLUENZA VACCINE  Discontinued  . DEXA SCAN  Discontinued  . TETANUS/TDAP  Discontinued  . COVID-19 Vaccine  Discontinued  . PNA vac Low Risk Adult  Discontinued   Dexa Scan- discontinued per patient.  Tdap- discontinued per patient.  Pneumococcal vaccine- discontinued per  patient.  Covid booster- discontinued per patient.  Influenza vaccine- discontinued per patient.   Colorectal cancer screening: No longer required.   Mammogram-  No longer required.  Bone Density- Discontinued per patient.   Lung Cancer Screening: (Low Dose CT Chest recommended if Age 84-80 years, 30 pack-year currently smoking OR have quit w/in 15years.) does not qualify.   Hepatitis C Screening: does not qualify.  Vision Screening: Recommended annual ophthalmology exams for early detection of glaucoma and other disorders of the eye. Is the patient up to date with their annual eye exam?  Yes  Who is the provider or what is the name of the office in which the patient attends annual eye exams? [facility], Dr. Audrie Gallus  Dental Screening: Recommended annual dental exams for proper oral hygiene. Partial.   Community Resource Referral / Chronic Care Management: CRR required this visit?  No   CCM required this visit?  No      Plan:   Keep all routine maintenance appointments.   Follow up 01/27/20 @ 7:30  I have personally reviewed and noted the following in the patient's chart:   . Medical and social history . Use of alcohol, tobacco or illicit drugs  . Current medications and supplements . Functional ability and status . Nutritional status . Physical activity . Advanced directives . List of other physicians . Hospitalizations, surgeries, and ER visits in previous 12 months . Vitals . Screenings to include cognitive, depression, and falls . Referrals and appointments  In addition, I have reviewed and discussed with patient certain preventive protocols, quality metrics, and best practice recommendations. A written personalized care plan for preventive services as well as general preventive health recommendations were provided to patient via mychart.     Varney Biles, LPN   14/43/1540

## 2020-01-05 NOTE — Patient Instructions (Addendum)
Kathleen Wallace , Thank you for taking time to come for your Medicare Wellness Visit. I appreciate your ongoing commitment to your health goals. Please review the following plan we discussed and let me know if I can assist you in the future.   These are the goals we discussed: Goals      Patient Stated     DIET - INCREASE WATER INTAKE (pt-stated)      Stay hydrated      Weight (lb) < 124 lb (56.2 kg) (pt-stated)      Maintain weight at 120lb Monitor sugar intake       This is a list of the screening recommended for you and due dates:  Health Maintenance  Topic Date Due   Flu Shot  Discontinued   DEXA scan (bone density measurement)  Discontinued   Tetanus Vaccine  Discontinued   COVID-19 Vaccine  Discontinued   Pneumonia vaccines  Discontinued    Immunizations Immunization History  Administered Date(s) Administered   Moderna Sars-Covid-2 Vaccination 03/12/2019, 04/13/2019   Keep all routine maintenance appointments.   Follow up 01/27/20 @ 7:30  Advanced directives: completed, on file.  Conditions/risks identified: none new.   Follow up in one year for your annual wellness visit.   Preventive Care 84 Years and Older, Female Preventive care refers to lifestyle choices and visits with your health care provider that can promote health and wellness. What does preventive care include?  A yearly physical exam. This is also called an annual well check.  Dental exams once or twice a year.  Routine eye exams. Ask your health care provider how often you should have your eyes checked.  Personal lifestyle choices, including:  Daily care of your teeth and gums.  Regular physical activity.  Eating a healthy diet.  Avoiding tobacco and drug use.  Limiting alcohol use.  Practicing safe sex.  Taking low-dose aspirin every day.  Taking vitamin and mineral supplements as recommended by your health care provider. What happens during an annual well check? The  services and screenings done by your health care provider during your annual well check will depend on your age, overall health, lifestyle risk factors, and family history of disease. Counseling  Your health care provider may ask you questions about your:  Alcohol use.  Tobacco use.  Drug use.  Emotional well-being.  Home and relationship well-being.  Sexual activity.  Eating habits.  History of falls.  Memory and ability to understand (cognition).  Work and work Statistician.  Reproductive health. Screening  You may have the following tests or measurements:  Height, weight, and BMI.  Blood pressure.  Lipid and cholesterol levels. These may be checked every 5 years, or more frequently if you are over 57 years old.  Skin check.  Lung cancer screening. You may have this screening every year starting at age 67 if you have a 30-pack-year history of smoking and currently smoke or have quit within the past 15 years.  Fecal occult blood test (FOBT) of the stool. You may have this test every year starting at age 36.  Flexible sigmoidoscopy or colonoscopy. You may have a sigmoidoscopy every 5 years or a colonoscopy every 10 years starting at age 10.  Hepatitis C blood test.  Hepatitis B blood test.  Sexually transmitted disease (STD) testing.  Diabetes screening. This is done by checking your blood sugar (glucose) after you have not eaten for a while (fasting). You may have this done every 1-3 years.  Bone density  scan. This is done to screen for osteoporosis. You may have this done starting at age 24.  Mammogram. This may be done every 1-2 years. Talk to your health care provider about how often you should have regular mammograms. Talk with your health care provider about your test results, treatment options, and if necessary, the need for more tests. Vaccines  Your health care provider may recommend certain vaccines, such as:  Influenza vaccine. This is recommended  every year.  Tetanus, diphtheria, and acellular pertussis (Tdap, Td) vaccine. You may need a Td booster every 10 years.  Zoster vaccine. You may need this after age 53.  Pneumococcal 13-valent conjugate (PCV13) vaccine. One dose is recommended after age 39.  Pneumococcal polysaccharide (PPSV23) vaccine. One dose is recommended after age 16. Talk to your health care provider about which screenings and vaccines you need and how often you need them. This information is not intended to replace advice given to you by your health care provider. Make sure you discuss any questions you have with your health care provider. Document Released: 02/02/2015 Document Revised: 09/26/2015 Document Reviewed: 11/07/2014 Elsevier Interactive Patient Education  2017 [place] Prevention in the Home Falls can cause injuries. They can happen to people of all ages. There are many things you can do to make your home safe and to help prevent falls. What can I do on the outside of my home?  Regularly fix the edges of walkways and driveways and fix any cracks.  Remove anything that might make you trip as you walk through a door, such as a raised step or threshold.  Trim any bushes or trees on the path to your home.  Use bright outdoor lighting.  Clear any walking paths of anything that might make someone trip, such as rocks or tools.  Regularly check to see if handrails are loose or broken. Make sure that both sides of any steps have handrails.  Any raised decks and porches should have guardrails on the edges.  Have any leaves, snow, or ice cleared regularly.  Use sand or salt on walking paths during winter.  Clean up any spills in your garage right away. This includes oil or grease spills. What can I do in the bathroom?  Use night lights.  Install grab bars by the toilet and in the tub and shower. Do not use towel bars as grab bars.  Use non-skid mats or decals in the tub or shower.  If  you need to sit down in the shower, use a plastic, non-slip stool.  Keep the floor dry. Clean up any water that spills on the floor as soon as it happens.  Remove soap buildup in the tub or shower regularly.  Attach bath mats securely with double-sided non-slip rug tape.  Do not have throw rugs and other things on the floor that can make you trip. What can I do in the bedroom?  Use night lights.  Make sure that you have a light by your bed that is easy to reach.  Do not use any sheets or blankets that are too big for your bed. They should not hang down onto the floor.  Have a firm chair that has side arms. You can use this for support while you get dressed.  Do not have throw rugs and other things on the floor that can make you trip. What can I do in the kitchen?  Clean up any spills right away.  Avoid walking on wet  floors.  Keep items that you use a lot in easy-to-reach places.  If you need to reach something above you, use a strong step stool that has a grab bar.  Keep electrical cords out of the way.  Do not use floor polish or wax that makes floors slippery. If you must use wax, use non-skid floor wax.  Do not have throw rugs and other things on the floor that can make you trip. What can I do with my stairs?  Do not leave any items on the stairs.  Make sure that there are handrails on both sides of the stairs and use them. Fix handrails that are broken or loose. Make sure that handrails are as long as the stairways.  Check any carpeting to make sure that it is firmly attached to the stairs. Fix any carpet that is loose or worn.  Avoid having throw rugs at the top or bottom of the stairs. If you do have throw rugs, attach them to the floor with carpet tape.  Make sure that you have a light switch at the top of the stairs and the bottom of the stairs. If you do not have them, ask someone to add them for you. What else can I do to help prevent falls?  Wear shoes  that:  Do not have high heels.  Have rubber bottoms.  Are comfortable and fit you well.  Are closed at the toe. Do not wear sandals.  If you use a stepladder:  Make sure that it is fully opened. Do not climb a closed stepladder.  Make sure that both sides of the stepladder are locked into place.  Ask someone to hold it for you, if possible.  Clearly mark and make sure that you can see:  Any grab bars or handrails.  First and last steps.  Where the edge of each step is.  Use tools that help you move around (mobility aids) if they are needed. These include:  Canes.  Walkers.  Scooters.  Crutches.  Turn on the lights when you go into a dark area. Replace any light bulbs as soon as they burn out.  Set up your furniture so you have a clear path. Avoid moving your furniture around.  If any of your floors are uneven, fix them.  If there are any pets around you, be aware of where they are.  Review your medicines with your doctor. Some medicines can make you feel dizzy. This can increase your chance of falling. Ask your doctor what other things that you can do to help prevent falls. This information is not intended to replace advice given to you by your health care provider. Make sure you discuss any questions you have with your health care provider. Document Released: 11/02/2008 Document Revised: 06/14/2015 Document Reviewed: 02/10/2014 Elsevier Interactive Patient Education  2017 [employer].

## 2020-01-09 DIAGNOSIS — R69 Illness, unspecified: Secondary | ICD-10-CM | POA: Diagnosis not present

## 2020-01-17 ENCOUNTER — Emergency Department: Payer: Medicare HMO

## 2020-01-17 ENCOUNTER — Inpatient Hospital Stay
Admission: EM | Admit: 2020-01-17 | Discharge: 2020-01-20 | DRG: 871 | Disposition: A | Discharge status: Home / Self Care | Payer: Medicare HMO | Attending: Internal Medicine | Admitting: Internal Medicine

## 2020-01-17 ENCOUNTER — Other Ambulatory Visit: Payer: Self-pay

## 2020-01-17 ENCOUNTER — Ambulatory Visit (INDEPENDENT_AMBULATORY_CARE_PROVIDER_SITE_OTHER)
Admission: EM | Admit: 2020-01-17 | Discharge: 2020-01-17 | Disposition: A | Discharge status: Nursing Home (Includes ICF) | Payer: Medicare HMO | Source: Home / Self Care

## 2020-01-17 DIAGNOSIS — H409 Unspecified glaucoma: Secondary | ICD-10-CM | POA: Diagnosis present

## 2020-01-17 DIAGNOSIS — R410 Disorientation, unspecified: Secondary | ICD-10-CM

## 2020-01-17 DIAGNOSIS — K219 Gastro-esophageal reflux disease without esophagitis: Secondary | ICD-10-CM | POA: Diagnosis present

## 2020-01-17 DIAGNOSIS — Z20822 Contact with and (suspected) exposure to covid-19: Secondary | ICD-10-CM | POA: Diagnosis present

## 2020-01-17 DIAGNOSIS — Z90711 Acquired absence of uterus with remaining cervical stump: Secondary | ICD-10-CM

## 2020-01-17 DIAGNOSIS — R6520 Severe sepsis without septic shock: Secondary | ICD-10-CM | POA: Diagnosis not present

## 2020-01-17 DIAGNOSIS — I4891 Unspecified atrial fibrillation: Secondary | ICD-10-CM | POA: Diagnosis not present

## 2020-01-17 DIAGNOSIS — D649 Anemia, unspecified: Secondary | ICD-10-CM | POA: Diagnosis present

## 2020-01-17 DIAGNOSIS — J189 Pneumonia, unspecified organism: Secondary | ICD-10-CM | POA: Diagnosis present

## 2020-01-17 DIAGNOSIS — A419 Sepsis, unspecified organism: Secondary | ICD-10-CM | POA: Diagnosis not present

## 2020-01-17 DIAGNOSIS — E872 Acidosis: Secondary | ICD-10-CM | POA: Diagnosis present

## 2020-01-17 DIAGNOSIS — Z79899 Other long term (current) drug therapy: Secondary | ICD-10-CM | POA: Diagnosis not present

## 2020-01-17 DIAGNOSIS — J9801 Acute bronchospasm: Secondary | ICD-10-CM

## 2020-01-17 DIAGNOSIS — J440 Chronic obstructive pulmonary disease with acute lower respiratory infection: Secondary | ICD-10-CM | POA: Diagnosis present

## 2020-01-17 DIAGNOSIS — Z7951 Long term (current) use of inhaled steroids: Secondary | ICD-10-CM | POA: Diagnosis not present

## 2020-01-17 DIAGNOSIS — R0600 Dyspnea, unspecified: Secondary | ICD-10-CM | POA: Diagnosis not present

## 2020-01-17 DIAGNOSIS — I110 Hypertensive heart disease with heart failure: Secondary | ICD-10-CM | POA: Diagnosis present

## 2020-01-17 DIAGNOSIS — R0902 Hypoxemia: Secondary | ICD-10-CM | POA: Diagnosis not present

## 2020-01-17 DIAGNOSIS — I447 Left bundle-branch block, unspecified: Secondary | ICD-10-CM | POA: Diagnosis present

## 2020-01-17 DIAGNOSIS — J449 Chronic obstructive pulmonary disease, unspecified: Secondary | ICD-10-CM | POA: Diagnosis not present

## 2020-01-17 DIAGNOSIS — I480 Paroxysmal atrial fibrillation: Secondary | ICD-10-CM | POA: Diagnosis present

## 2020-01-17 DIAGNOSIS — I959 Hypotension, unspecified: Secondary | ICD-10-CM | POA: Diagnosis not present

## 2020-01-17 DIAGNOSIS — R0602 Shortness of breath: Secondary | ICD-10-CM

## 2020-01-17 DIAGNOSIS — J9601 Acute respiratory failure with hypoxia: Secondary | ICD-10-CM | POA: Diagnosis not present

## 2020-01-17 DIAGNOSIS — R059 Cough, unspecified: Secondary | ICD-10-CM | POA: Diagnosis not present

## 2020-01-17 DIAGNOSIS — R0689 Other abnormalities of breathing: Secondary | ICD-10-CM | POA: Diagnosis not present

## 2020-01-17 DIAGNOSIS — R531 Weakness: Secondary | ICD-10-CM | POA: Diagnosis not present

## 2020-01-17 DIAGNOSIS — I5022 Chronic systolic (congestive) heart failure: Secondary | ICD-10-CM | POA: Diagnosis present

## 2020-01-17 DIAGNOSIS — Z85828 Personal history of other malignant neoplasm of skin: Secondary | ICD-10-CM

## 2020-01-17 LAB — COMPREHENSIVE METABOLIC PANEL
ALT: 15 U/L (ref 0–44)
AST: 21 U/L (ref 15–41)
Albumin: 3.9 g/dL (ref 3.5–5.0)
Alkaline Phosphatase: 40 U/L (ref 38–126)
Anion gap: 12 (ref 5–15)
BUN: 22 mg/dL (ref 8–23)
CO2: 24 mmol/L (ref 22–32)
Calcium: 8.3 mg/dL — ABNORMAL LOW (ref 8.9–10.3)
Chloride: 99 mmol/L (ref 98–111)
Creatinine, Ser: 0.75 mg/dL (ref 0.44–1.00)
GFR, Estimated: >60 mL/min (ref >60)
Glucose, Bld: 126 mg/dL — ABNORMAL HIGH (ref 70–99)
Potassium: 4 mmol/L (ref 3.5–5.1)
Sodium: 135 mmol/L (ref 135–145)
Total Bilirubin: 1 mg/dL (ref 0.3–1.2)
Total Protein: 6.5 g/dL (ref 6.5–8.1)

## 2020-01-17 LAB — URINALYSIS, COMPLETE (UACMP) WITH MICROSCOPIC
Bilirubin Urine: NEGATIVE
Glucose, UA: NEGATIVE mg/dL
Hgb urine dipstick: NEGATIVE
Ketones, ur: 20 mg/dL — AB
Leukocytes,Ua: NEGATIVE
Nitrite: NEGATIVE
Protein, ur: 30 mg/dL — AB
Specific Gravity, Urine: 1.026 (ref 1.005–1.030)
Squamous Epithelial / HPF: NONE SEEN (ref 0–5)
pH: 5 (ref 5.0–8.0)

## 2020-01-17 LAB — CBC WITH DIFFERENTIAL/PLATELET
Abs Immature Granulocytes: 0.05 10*3/uL (ref 0.00–0.07)
Basophils Absolute: 0 10*3/uL (ref 0.0–0.1)
Basophils Relative: 0 %
Eosinophils Absolute: 0 10*3/uL (ref 0.0–0.5)
Eosinophils Relative: 0 %
HCT: 32 % — ABNORMAL LOW (ref 36.0–46.0)
Hemoglobin: 10.7 g/dL — ABNORMAL LOW (ref 12.0–15.0)
Immature Granulocytes: 0 %
Lymphocytes Relative: 8 %
Lymphs Abs: 0.9 10*3/uL (ref 0.7–4.0)
MCH: 31.2 pg (ref 26.0–34.0)
MCHC: 33.4 g/dL (ref 30.0–36.0)
MCV: 93.3 fL (ref 80.0–100.0)
Monocytes Absolute: 0.7 10*3/uL (ref 0.1–1.0)
Monocytes Relative: 6 %
Neutro Abs: 10 10*3/uL — ABNORMAL HIGH (ref 1.7–7.7)
Neutrophils Relative %: 86 %
Platelets: 201 10*3/uL (ref 150–400)
RBC: 3.43 MIL/uL — ABNORMAL LOW (ref 3.87–5.11)
RDW: 13.3 % (ref 11.5–15.5)
WBC: 11.7 10*3/uL — ABNORMAL HIGH (ref 4.0–10.5)
nRBC: 0 % (ref 0.0–0.2)

## 2020-01-17 LAB — APTT: aPTT: 29 seconds (ref 24–36)

## 2020-01-17 LAB — RESP PANEL BY RT-PCR (FLU A&B, COVID) ARPGX2
Influenza A by PCR: NEGATIVE
Influenza B by PCR: NEGATIVE
SARS Coronavirus 2 by RT PCR: NEGATIVE

## 2020-01-17 LAB — LACTIC ACID, PLASMA
Lactic Acid, Venous: 1.3 mmol/L (ref 0.5–1.9)
Lactic Acid, Venous: 1.7 mmol/L (ref 0.5–1.9)

## 2020-01-17 LAB — PROTIME-INR
INR: 1.1 (ref 0.8–1.2)
Prothrombin Time: 13.6 seconds (ref 11.4–15.2)

## 2020-01-17 LAB — MAGNESIUM: Magnesium: 1.8 mg/dL (ref 1.7–2.4)

## 2020-01-17 LAB — PROCALCITONIN: Procalcitonin: 3.55 ng/mL

## 2020-01-17 LAB — TROPONIN I (HIGH SENSITIVITY)
Troponin I (High Sensitivity): 10 ng/L (ref <18)
Troponin I (High Sensitivity): 32 ng/L — ABNORMAL HIGH (ref <18)

## 2020-01-17 LAB — GLUCOSE, CAPILLARY: Glucose-Capillary: 122 mg/dL — ABNORMAL HIGH (ref 70–99)

## 2020-01-17 MED ORDER — GUAIFENESIN ER 600 MG PO TB12
600.0000 mg | ORAL_TABLET | Freq: Two times a day (BID) | ORAL | Status: DC
  Administered 2020-01-18 – 2020-01-20 (×6): 600 mg via ORAL
  Filled 2020-01-17 (×6): qty 1

## 2020-01-17 MED ORDER — ASCORBIC ACID 500 MG PO TABS
500.0000 mg | ORAL_TABLET | Freq: Every day | ORAL | Status: DC
  Administered 2020-01-18 – 2020-01-20 (×4): 500 mg via ORAL
  Filled 2020-01-17 (×4): qty 1

## 2020-01-17 MED ORDER — SODIUM CHLORIDE 0.9 % IV SOLN: 2.0000 g | INTRAVENOUS | Status: DC

## 2020-01-17 MED ORDER — LACTATED RINGERS IV BOLUS (SEPSIS)
250.0000 mL | Freq: Once | INTRAVENOUS | Status: AC
  Administered 2020-01-17: 250 mL via INTRAVENOUS

## 2020-01-17 MED ORDER — ADULT MULTIVITAMIN W/MINERALS CH
1.0000 | ORAL_TABLET | Freq: Every day | ORAL | Status: DC
  Administered 2020-01-18 – 2020-01-20 (×4): 1 via ORAL
  Filled 2020-01-17 (×4): qty 1

## 2020-01-17 MED ORDER — ACETAMINOPHEN 500 MG PO TABS
1000.0000 mg | ORAL_TABLET | Freq: Once | ORAL | Status: AC
  Administered 2020-01-17: 1000 mg via ORAL
  Filled 2020-01-17: qty 2

## 2020-01-17 MED ORDER — ACETAMINOPHEN 650 MG RE SUPP: 650.0000 mg | Freq: Four times a day (QID) | RECTAL | Status: DC | PRN

## 2020-01-17 MED ORDER — ACETAMINOPHEN 325 MG PO TABS: 650.0000 mg | ORAL_TABLET | Freq: Four times a day (QID) | ORAL | Status: DC | PRN

## 2020-01-17 MED ORDER — CEFTRIAXONE SODIUM 2 G IJ SOLR
2.0000 g | INTRAMUSCULAR | Status: DC
  Administered 2020-01-18 – 2020-01-19 (×2): 2 g via INTRAVENOUS
  Filled 2020-01-17 (×3): qty 20

## 2020-01-17 MED ORDER — COQ10 100 MG PO CAPS: 100.0000 mg | ORAL_CAPSULE | Freq: Every day | ORAL | Status: DC

## 2020-01-17 MED ORDER — TRAZODONE HCL 50 MG PO TABS
25.0000 mg | ORAL_TABLET | Freq: Every evening | ORAL | Status: DC | PRN
  Administered 2020-01-18: 25 mg via ORAL
  Filled 2020-01-17: qty 1

## 2020-01-17 MED ORDER — MAGNESIUM HYDROXIDE 400 MG/5ML PO SUSP: 30.0000 mL | Freq: Every day | ORAL | Status: DC | PRN

## 2020-01-17 MED ORDER — SODIUM CHLORIDE 0.9 % IV SOLN
500.0000 mg | INTRAVENOUS | Status: DC
  Administered 2020-01-19: 500 mg via INTRAVENOUS
  Filled 2020-01-17 (×2): qty 500

## 2020-01-17 MED ORDER — ENOXAPARIN SODIUM 40 MG/0.4ML ~~LOC~~ SOLN
40.0000 mg | SUBCUTANEOUS | Status: DC
  Administered 2020-01-18: 40 mg via SUBCUTANEOUS
  Filled 2020-01-17: qty 0.4

## 2020-01-17 MED ORDER — DM-GUAIFENESIN ER 30-600 MG PO TB12
1.0000 | ORAL_TABLET | Freq: Two times a day (BID) | ORAL | Status: DC
  Administered 2020-01-18 – 2020-01-20 (×5): 1 via ORAL
  Filled 2020-01-17 (×6): qty 1

## 2020-01-17 MED ORDER — LACTATED RINGERS IV BOLUS (SEPSIS)
1000.0000 mL | Freq: Once | INTRAVENOUS | Status: AC
  Administered 2020-01-17: 1000 mL via INTRAVENOUS

## 2020-01-17 MED ORDER — IPRATROPIUM-ALBUTEROL 0.5-2.5 (3) MG/3ML IN SOLN
3.0000 mL | Freq: Four times a day (QID) | RESPIRATORY_TRACT | Status: DC
  Administered 2020-01-18 – 2020-01-20 (×10): 3 mL via RESPIRATORY_TRACT
  Filled 2020-01-17 (×9): qty 3

## 2020-01-17 MED ORDER — METHYLPREDNISOLONE SODIUM SUCC 125 MG IJ SOLR: 125.0000 mg | INTRAMUSCULAR | Status: DC

## 2020-01-17 MED ORDER — DORZOLAMIDE HCL-TIMOLOL MAL PF 22.3-6.8 MG/ML OP SOLN: 1.0000 [drp] | Freq: Two times a day (BID) | OPHTHALMIC | Status: DC

## 2020-01-17 MED ORDER — SODIUM CHLORIDE 0.9 % IV SOLN: 500.0000 mg | INTRAVENOUS | Status: DC

## 2020-01-17 MED ORDER — BUDESONIDE 0.25 MG/2ML IN SUSP
0.2500 mg | Freq: Two times a day (BID) | RESPIRATORY_TRACT | Status: DC
  Administered 2020-01-18 – 2020-01-20 (×5): 0.25 mg via RESPIRATORY_TRACT
  Filled 2020-01-17 (×5): qty 2

## 2020-01-17 MED ORDER — ONDANSETRON HCL 4 MG PO TABS: 4.0000 mg | ORAL_TABLET | Freq: Four times a day (QID) | ORAL | Status: DC | PRN

## 2020-01-17 MED ORDER — LACTATED RINGERS IV BOLUS (SEPSIS)
500.0000 mL | Freq: Once | INTRAVENOUS | Status: AC
  Administered 2020-01-17: 500 mL via INTRAVENOUS

## 2020-01-17 MED ORDER — SODIUM CHLORIDE 0.9 % IV SOLN: INTRAVENOUS | Status: DC

## 2020-01-17 MED ORDER — LATANOPROST 0.005 % OP SOLN
1.0000 [drp] | Freq: Every day | OPHTHALMIC | Status: DC
  Filled 2020-01-17 (×2): qty 2.5

## 2020-01-17 MED ORDER — FLUTICASONE PROPIONATE HFA 110 MCG/ACT IN AERO
2.0000 | INHALATION_SPRAY | Freq: Two times a day (BID) | RESPIRATORY_TRACT | Status: DC
  Filled 2020-01-17: qty 12

## 2020-01-17 MED ORDER — ONDANSETRON HCL 4 MG/2ML IJ SOLN: 4.0000 mg | Freq: Four times a day (QID) | INTRAMUSCULAR | Status: DC | PRN

## 2020-01-17 MED ORDER — METHYLPREDNISOLONE SODIUM SUCC 125 MG IJ SOLR
125.0000 mg | Freq: Once | INTRAMUSCULAR | Status: AC
  Administered 2020-01-18: 125 mg via INTRAVENOUS
  Filled 2020-01-17: qty 2

## 2020-01-17 MED ORDER — SODIUM CHLORIDE 0.9 % IV SOLN
2.0000 g | INTRAVENOUS | Status: DC
  Administered 2020-01-17: 2 g via INTRAVENOUS
  Filled 2020-01-17 (×2): qty 20

## 2020-01-17 MED ORDER — METHYLPREDNISOLONE SODIUM SUCC 40 MG IJ SOLR: 40.0000 mg | Freq: Three times a day (TID) | INTRAMUSCULAR | Status: DC

## 2020-01-17 MED ORDER — SODIUM CHLORIDE 0.9 % IV SOLN
500.0000 mg | INTRAVENOUS | Status: DC
  Administered 2020-01-17: 500 mg via INTRAVENOUS
  Filled 2020-01-17 (×2): qty 500

## 2020-01-17 NOTE — Progress Notes (Signed)

## 2020-01-17 NOTE — Progress Notes (Signed)
CODE SEPSIS - PHARMACY COMMUNICATION  **Broad Spectrum Antibiotics should be administered within 1 hour of Sepsis diagnosis**  Time Code Sepsis Page Received: 12/28 1950  Antibiotics Ordered: @ 2000 + Ceftriaxone 2g q24h IV (12/28>> + Azithromycin 500mg  IV (12/28>>  Time of 1st antibiotic administration: 2001  Additional action taken by pharmacy: n/a  If necessary, Name of Provider/Nurse Contacted: n/a   2002 ,PharmD Clinical Pharmacist  01/17/2020  7:51 PM

## 2020-01-17 NOTE — ED Triage Notes (Signed)
Pt arrives [facility] from [facility] for cough, SOB, loss of appetite, fever of 100.5. 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 88% on RA, placed on [place] 4 L.   Pt arrives [facility]. VSS. CBG 109.

## 2020-01-17 NOTE — ED Notes (Signed)
Patient is being discharged from the Urgent Care and sent to the Emergency Department via EMS . Per Wyvonnia Dusky, NP, patient is in need of higher level of care due to r/o CVA vs. Sepsis Patient is aware and verbalizes understanding of plan of care.  Vitals:   01/17/20 1719  BP: 133/63  Pulse: (!) 105  Resp: (!) 24  Temp: (!) 100.5 F (38.1 C)  SpO2: (!) 89%

## 2020-01-17 NOTE — ED Notes (Signed)
Pt given meal tray at thsi time 

## 2020-01-17 NOTE — ED Triage Notes (Signed)
Pt c/o lack of appetite, has not eaten anything substantial for several days, generalized weakness, productive cough, congestion, runny nose, difficulty thinking.   Pt has difficulty finding words to answer questions, difficulty ambulating. Pt's husband reports new onset of pt's difficulty finding the right words. Pt's husband reports last time normal 0600, husband was gone all day.  Smile symetrical, grips equal/weak. Oriented to month and year, difficulty disclosing location, day of week. Lungs bilateral coarse sounds. Notified provider of pt condition/status, Wyvonnia Dusky at [facility] for evaluation and ordered for EMS to be dispatched, for poss CVA. EKG and CBG performed.

## 2020-01-17 NOTE — ED Notes (Signed)
EMS arrived for transport, report given.

## 2020-01-17 NOTE — ED Notes (Signed)
Date and time results received: 01/17/20 2222   Test: troponin Critical Value: 32  Name of Provider Notified: Arville Care, MD   Orders Received? Or Actions Taken?: MD aware

## 2020-01-17 NOTE — ED Provider Notes (Signed)
[facility]    CSN: [id number] Arrival date & time: 01/17/20  1706      History   Chief Complaint Chief Complaint  Patient presents with  . Cough  . Weakness  . loss of appetite  . Code Stroke  . possible sepsis    HPI Kathleen Wallace is a 86 y.o. female.   HPI   Called to patient's bedside to evaluate for possible CVA as patient is having word finding difficulty.  Patient is a very pleasant 86 year old female who is oriented to person only.  Has reports that he last saw her at 6 AM this morning and there is nothing abnormal but when he returned home at 3:00 he found her not herself.  Patient is having difficulty following commands, is oriented to person only, is tachycardic at 105, febrile at 100.5, as well as mildly hypoxic at 89% on room air.  Patient's husband also reports that she has not been eating for the past few days.  Past Medical History:  Diagnosis Date  . Cancer ([place])    skin cancer nose  . Complication of anesthesia    woke up during partial hysterectomy  . Elevated blood pressure   . GERD (gastroesophageal reflux disease)    occ-no meds  . Headache    h/o   . Heart murmur    asymptomatic    Patient Active Problem List   Diagnosis Date Noted  . SOB (shortness of breath) 10/09/2019  . Low back pain 12/31/2017  . Thyroid mass 02/03/2016  . Health care maintenance 06/23/2015  . Essential hypertension 04/20/2014  . Dizziness 04/20/2014  . Shoulder pain 04/20/2014  . Stress 11/24/2013  . Sinusitis 11/24/2013  . Environmental allergies 08/03/2012  . Wheezing 08/03/2012  . Hypercholesterolemia 08/02/2012  . GERD (gastroesophageal reflux disease) 06/06/2012  . Heart murmur 06/06/2012    Past Surgical History:  Procedure Laterality Date  . ABDOMINAL HYSTERECTOMY  1974   partial  . BREAST SURGERY     IMPLANTS  . CATARACT EXTRACTION W/PHACO Right 02/16/2018   Procedure: CATARACT EXTRACTION PHACO AND INTRAOCULAR LENS PLACEMENT ([place])  RIGHT;  Surgeon: Marchia Meiers, MD;  Location: [facility] ORS;  Service: Ophthalmology;  Laterality: Right;  [place]  01:10 CDE 11.02 Fluid pack lot # [id number]  . CATARACT EXTRACTION W/PHACO Left 03/18/2018   Procedure: CATARACT EXTRACTION PHACO AND INTRAOCULAR LENS PLACEMENT ([place]) LEFT;  Surgeon: Marchia Meiers, MD;  Location: [facility] ORS;  Service: Ophthalmology;  Laterality: Left;  [place] 01:06.5 CDE 10.80 FLUID PACK LOT # [id number]  . LUMBAR LAMINECTOMY/ DECOMPRESSION WITH MET-RX Bilateral 09/20/2018   Procedure: LUMBAR LAMINECTOMY/ DECOMPRESSION WITH MET-RX;  Surgeon: Deetta Perla, MD;  Location: [facility] ORS;  Service: Neurosurgery;  Laterality: Bilateral;  . TONSILLECTOMY AND ADENOIDECTOMY  1944    OB History   No obstetric history on file.      Home Medications    Prior to Admission medications   Medication Sig Start Date End Date Taking? Authorizing Provider  fluticasone furoate-vilanterol (BREO ELLIPTA) 100-25 MCG/INH AEPB Inhale into the lungs. 10/05/19  Yes [provider]  albuterol (VENTOLIN HFA) 108 (90 Base) MCG/ACT inhaler Inhale 1-2 puffs into the lungs every 6 (six) hours as needed for wheezing or shortness of breath. 12/23/19   Cook, Barnie Del, DO  BREO ELLIPTA 100-25 MCG/INH AEPB SMARTSIG:1 Inhalation Via Inhaler Daily 10/05/19   [provider]  Coenzyme Q10 (COQ10) 100 MG CAPS Take 100 mg by mouth daily.     [provider]  dorzolamidel-timolol (COSOPT) 22.3-6.8 MG/ML SOLN ophthalmic solution Place 1 drop into both eyes 2 (two) times daily.    [provider]  doxycycline (VIBRAMYCIN) 100 MG capsule Take 1 capsule (100 mg total) by mouth 2 (two) times daily. 12/23/19   Coral Spikes, DO  fluticasone (FLOVENT HFA) 110 MCG/ACT inhaler  07/04/19   [provider]  latanoprost (XALATAN) 0.005 % ophthalmic solution Place 1 drop into both eyes at bedtime.    [provider]  Multiple Vitamins-Minerals (MULTIVITAMIN WITH MINERALS) tablet Take 1  tablet by mouth daily.     [provider]  predniSONE (DELTASONE) 50 MG tablet 1 tablet daily x 5 days 12/23/19   Coral Spikes, DO  ROCKLATAN 0.02-0.005 % SOLN SMARTSIG:1 Drop(s) In [place]) Every Evening 05/24/19   [provider]  vitamin C (ASCORBIC ACID) 500 MG tablet Take 500 mg by mouth daily.     [provider]    Family History Family History  Problem Relation Age of Onset  . Stroke Mother   . Hypertension Mother   . Stroke Father   . Hypertension Father   . Diabetes Other     Social History Social History   Tobacco Use  . Smoking status: Never Smoker  . Smokeless tobacco: Never Used  Vaping Use  . Vaping Use: Never used  Substance Use Topics  . Alcohol use: No    Alcohol/week: 0.0 standard drinks  . Drug use: No     Allergies   Other, Codeine, and Amoxicillin   Review of Systems Review of Systems  Constitutional: Positive for fever.  Neurological: Negative for dizziness, syncope and facial asymmetry.     Physical Exam Triage Vital Signs ED Triage Vitals [01/17/20 1719]  Enc Vitals Group     BP 133/63     Pulse Rate (!) 105     Resp (!) 24     Temp (!) 100.5 F (38.1 C)     Temp Source Oral     SpO2 (!) 89 %     Weight      Height      Head Circumference      Peak Flow      Pain Score      Pain Loc      Pain Edu?      Excl. in [place]?    No data found.  Updated Vital Signs BP 133/63 (BP Location: Left Arm)   Pulse (!) 105   Temp (!) 100.5 F (38.1 C) (Oral)   Resp (!) 24   SpO2 (!) 89%   Visual Acuity Right Eye Distance:   Left Eye Distance:   Bilateral Distance:    Right Eye Near:   Left Eye Near:    Bilateral Near:     Physical Exam Vitals and nursing note reviewed.  Constitutional:      General: She is not in acute distress.    Appearance: Normal appearance.  HENT:     Head: Normocephalic and atraumatic.  Skin:    General: Skin is warm.     Capillary Refill: Capillary refill takes less than 2  seconds.  Neurological:     Mental Status: She is alert.      UC Treatments / Results  Labs (all labs ordered are listed, but only abnormal results are displayed) Labs Reviewed  GLUCOSE, CAPILLARY - Abnormal; Notable for the following components:      Result Value   Glucose-Capillary 122 (*)  All other components within normal limits  CBG MONITORING, ED    EKG   Radiology No results found.  Procedures Procedures (including critical care time)  Medications Ordered in UC Medications - No data to display  Initial Impression / Assessment and Plan / UC Course  I have reviewed the triage vital signs and the nursing notes.  Pertinent labs & imaging results that were available during my care of the patient were reviewed by me and considered in my medical decision making (see chart for details).   On exam patient is able to tell me her name with some difficulty.  Patient is not able to tell me where she is at or what the day is or the date.  Patient also is having trouble following commands.  Asked patient to raise her left arm to check pronator drift and she raised her right.  Patient did not have pronator drift with either arm.  Patient is having difficulty with proprioception with finger-to-nose.  Differential diagnosis include CVA, sepsis, or other infectious process.  Patient transported to [facility] ED via EMS.   Final Clinical Impressions(s) / UC Diagnoses   Final diagnoses:  Disorientation     Discharge Instructions     Patient discharged by EMS to [facility] ER.    ED Prescriptions    None     PDMP not reviewed this encounter.   Margarette Canada, NP 01/17/20 1751

## 2020-01-17 NOTE — Discharge Instructions (Addendum)
Patient discharged by EMS to [facility] ER.

## 2020-01-17 NOTE — Sepsis Progress Note (Signed)
Sepsis protocol is being followed by [facility]. 

## 2020-01-17 NOTE — ED Provider Notes (Signed)
[facility] Emergency Department Provider Note   ____________________________________________   Event Date/Time   First MD Initiated Contact with Patient 01/17/20 1816     (approximate)  I have reviewed the triage vital signs and the nursing notes.   HISTORY  Chief Complaint Weakness   HPI Kathleen Wallace is a 86 y.o. female with past medical history of COPD and GERD who presents to the ED for generalized weakness.  History is limited as patient currently mildly confused with some difficulty answering questions.  Per EMS, patient has been feeling weak for the past few days and initially presented to urgent care.  While at urgent care, they were concerned that patient was having word finding difficulties and EMS was called due to possible code stroke.  Patient reportedly last known well at 6 AM this morning per husband at urgent care.  Patient also noted to have a temperature of 100.5 and heart rate of 105 while at urgent care.  On arrival to the ED, patient complains of some soreness in her chest, cough, difficulty breathing, generalized weakness, and malaise.  She is not sure if she has had a fever recently and is not sure how long her symptoms have been going on for.  She denies any abdominal pain, vomiting, diarrhea, dysuria, or hematuria.  She states she is fully vaccinated against COVID-19 and denies any recent sick contacts.        Past Medical History:  Diagnosis Date  . Cancer ([place])    skin cancer nose  . Complication of anesthesia    woke up during partial hysterectomy  . Elevated blood pressure   . GERD (gastroesophageal reflux disease)    occ-no meds  . Headache    h/o   . Heart murmur    asymptomatic    Patient Active Problem List   Diagnosis Date Noted  . CAP (community acquired pneumonia) 01/17/2020  . SOB (shortness of breath) 10/09/2019  . Low back pain 12/31/2017  . Thyroid mass 02/03/2016  . Health care maintenance 06/23/2015  .  Essential hypertension 04/20/2014  . Dizziness 04/20/2014  . Shoulder pain 04/20/2014  . Stress 11/24/2013  . Sinusitis 11/24/2013  . Environmental allergies 08/03/2012  . Wheezing 08/03/2012  . Hypercholesterolemia 08/02/2012  . GERD (gastroesophageal reflux disease) 06/06/2012  . Heart murmur 06/06/2012    Past Surgical History:  Procedure Laterality Date  . ABDOMINAL HYSTERECTOMY  1974   partial  . BREAST SURGERY     IMPLANTS  . CATARACT EXTRACTION W/PHACO Right 02/16/2018   Procedure: CATARACT EXTRACTION PHACO AND INTRAOCULAR LENS PLACEMENT ([place]) RIGHT;  Surgeon: Marchia Meiers, MD;  Location: [facility] ORS;  Service: Ophthalmology;  Laterality: Right;  [place]  01:10 CDE 11.02 Fluid pack lot # [id number]  . CATARACT EXTRACTION W/PHACO Left 03/18/2018   Procedure: CATARACT EXTRACTION PHACO AND INTRAOCULAR LENS PLACEMENT ([place]) LEFT;  Surgeon: Marchia Meiers, MD;  Location: [facility] ORS;  Service: Ophthalmology;  Laterality: Left;  [place] 01:06.5 CDE 10.80 FLUID PACK LOT # [id number]  . LUMBAR LAMINECTOMY/ DECOMPRESSION WITH MET-RX Bilateral 09/20/2018   Procedure: LUMBAR LAMINECTOMY/ DECOMPRESSION WITH MET-RX;  Surgeon: Deetta Perla, MD;  Location: [facility] ORS;  Service: Neurosurgery;  Laterality: Bilateral;  . TONSILLECTOMY AND ADENOIDECTOMY  1944    Prior to Admission medications   Medication Sig Start Date End Date Taking? Authorizing Provider  albuterol (VENTOLIN HFA) 108 (90 Base) MCG/ACT inhaler Inhale 1-2 puffs into the lungs every 6 (six) hours as needed for wheezing or shortness  of breath. 12/23/19   Cook, Barnie Del, DO  BREO ELLIPTA 100-25 MCG/INH AEPB SMARTSIG:1 Inhalation Via Inhaler Daily 10/05/19   [provider]  Coenzyme Q10 (COQ10) 100 MG CAPS Take 100 mg by mouth daily.     [provider]  dorzolamidel-timolol (COSOPT) 22.3-6.8 MG/ML SOLN ophthalmic solution Place 1 drop into both eyes 2 (two) times daily.    [provider]  doxycycline (VIBRAMYCIN) 100 MG  capsule Take 1 capsule (100 mg total) by mouth 2 (two) times daily. 12/23/19   Coral Spikes, DO  fluticasone (FLOVENT HFA) 110 MCG/ACT inhaler  07/04/19   [provider]  fluticasone furoate-vilanterol (BREO ELLIPTA) 100-25 MCG/INH AEPB Inhale into the lungs. 10/05/19   [provider]  latanoprost (XALATAN) 0.005 % ophthalmic solution Place 1 drop into both eyes at bedtime.    [provider]  Multiple Vitamins-Minerals (MULTIVITAMIN WITH MINERALS) tablet Take 1 tablet by mouth daily.     [provider]  predniSONE (DELTASONE) 50 MG tablet 1 tablet daily x 5 days 12/23/19   Coral Spikes, DO  ROCKLATAN 0.02-0.005 % SOLN SMARTSIG:1 Drop(s) In [place]) Every Evening 05/24/19   [provider]  vitamin C (ASCORBIC ACID) 500 MG tablet Take 500 mg by mouth daily.     [provider]    Allergies Other, Codeine, and Amoxicillin  Family History  Problem Relation Age of Onset  . Stroke Mother   . Hypertension Mother   . Stroke Father   . Hypertension Father   . Diabetes Other     Social History Social History   Tobacco Use  . Smoking status: Never Smoker  . Smokeless tobacco: Never Used  Vaping Use  . Vaping Use: Never used  Substance Use Topics  . Alcohol use: No    Alcohol/week: 0.0 standard drinks  . Drug use: No    Review of Systems  Constitutional: Positive for fever/chills.  Positive for generalized weakness and malaise. Eyes: No visual changes. ENT: No sore throat. Cardiovascular: Positive for chest pain. Respiratory: Positive for cough and shortness of breath. Gastrointestinal: No abdominal pain.  No nausea, no vomiting.  No diarrhea.  No constipation. Genitourinary: Negative for dysuria. Musculoskeletal: Negative for back pain. Skin: Negative for rash. Neurological: Negative for headaches, focal weakness or numbness.  ____________________________________________   PHYSICAL EXAM:  VITAL SIGNS: ED Triage Vitals  [01/17/20 1825]  Enc Vitals Group     BP      Pulse      Resp      Temp      Temp src      SpO2      Weight 120 lb (54.4 kg)     Height _0  (1.549 m)     Head Circumference      Peak Flow      Pain Score 4     Pain Loc      Pain Edu?      Excl. in [place]?     Constitutional: Alert and oriented to person, place, and time. Eyes: Conjunctivae are normal.  Pupils equal round and reactive to light bilaterally. Head: Atraumatic. Nose: No congestion/rhinnorhea. Mouth/Throat: Mucous membranes are moist. Neck: Normal ROM Cardiovascular: Tachycardic, regular rhythm. Grossly normal heart sounds. Respiratory: Normal respiratory effort.  No retractions. Lungs with rhonchi throughout. Gastrointestinal: Soft and nontender. No distention. Genitourinary: deferred Musculoskeletal: No lower extremity tenderness nor edema. Neurologic:  Normal speech and language. No gross focal neurologic deficits are appreciated. Skin:  Skin is warm, dry and intact. No rash noted. Psychiatric: Mood and affect are normal. Speech and behavior are normal.  ____________________________________________   LABS (all labs ordered are listed, but only abnormal results are displayed)  Labs Reviewed  COMPREHENSIVE METABOLIC PANEL - Abnormal; Notable for the following components:      Result Value   Glucose, Bld 126 (*)    Calcium 8.3 (*)    All other components within normal limits  CBC WITH DIFFERENTIAL/PLATELET - Abnormal; Notable for the following components:   WBC 11.7 (*)    RBC 3.43 (*)    Hemoglobin 10.7 (*)    HCT 32.0 (*)    Neutro Abs 10.0 (*)    All other components within normal limits  URINALYSIS, COMPLETE (UACMP) WITH MICROSCOPIC - Abnormal; Notable for the following components:   Color, Urine AMBER (*)    APPearance HAZY (*)    Ketones, ur 20 (*)    Protein, ur 30 (*)    Bacteria, UA RARE (*)    All other components within normal limits  RESP PANEL BY RT-PCR (FLU A&B, COVID) ARPGX2  URINE  CULTURE  CULTURE, BLOOD (ROUTINE X 2)  CULTURE, BLOOD (ROUTINE X 2)  EXPECTORATED SPUTUM ASSESSMENT W REFEX TO RESP CULTURE  LACTIC ACID, PLASMA  LACTIC ACID, PLASMA  PROTIME-INR  APTT  PROCALCITONIN  PROCALCITONIN  LEGIONELLA PNEUMOPHILA SEROGP 1 UR AG  STREP PNEUMONIAE URINARY ANTIGEN  BASIC METABOLIC PANEL  CBC  TROPONIN I (HIGH SENSITIVITY)  TROPONIN I (HIGH SENSITIVITY)   ____________________________________________  EKG  ED ECG REPORT I, Blake Divine, the attending physician, personally viewed and interpreted this ECG.   Date: 01/17/2020  EKG Time: 18:25  Rate: 94  Rhythm: normal sinus rhythm  Axis: Normal  Intervals:left bundle branch block  ST&T Change: None, negative sgarbossa  ED ECG REPORT I, Blake Divine, the attending physician, personally viewed and interpreted this ECG.   Date: 01/17/2020  EKG Time: 21:23  Rate: 99  Rhythm: atrial fibrillation, rate 99  Axis: Normal  Intervals:left bundle branch block  ST&T Change: None   PROCEDURES  Procedure(s) performed (including Critical Care):  .Critical Care Performed by: Blake Divine, MD Authorized by: Blake Divine, MD   Critical care provider statement:    Critical care time (minutes):  45   Critical care time was exclusive of:  Separately billable procedures and treating other patients and teaching time   Critical care was necessary to treat or prevent imminent or life-threatening deterioration of the following conditions:  Sepsis   Critical care was time spent personally by me on the following activities:  Discussions with consultants, evaluation of patient's response to treatment, examination of patient, ordering and performing treatments and interventions, ordering and review of laboratory studies, ordering and review of radiographic studies, pulse oximetry, re-evaluation of patient's condition, obtaining history from patient or surrogate and review of old charts   I assumed direction of  critical care for this patient from another provider in my specialty: no       ____________________________________________   INITIAL IMPRESSION / ASSESSMENT AND PLAN / ED COURSE       86 year old female with past medical history of GERD and COPD presents to the ED from urgent care with chest pain, cough, shortness of breath, fever, tachycardia, and mild confusion.  Patient reportedly last known well around 6 AM this morning, she does occasionally have difficulty answering questions but otherwise no focal neurologic deficits.  I have low suspicion for stroke and given  her last known well she is not a candidate for TPA, low suspicion for large vessel occlusion.  Her mild confusion seems more likely to be metabolic in nature given her fever and tachycardia.  We will start sepsis work-up and assess for infectious process, including COVID-19.  Pulmonary etiology seems most likely given patient's low O2 sats at urgent care.  She currently has O2 sat of 90 to 91% on room air.  Patient dropped O2 sat into the 80s and was placed on 3 L nasal cannula.  She has had occasional runs of atrial fibrillation but is now returned to normal sinus rhythm.  Chest x-ray reviewed by me and shows multifocal opacities concerning for pneumonia, COVID-19 testing is negative and procalcitonin is elevated.  We will start patient on treatment for community-acquired pneumonia.  Blood pressures were briefly low but this improved following IV fluids.  Case discussed with hospitalist for admission.      ____________________________________________   FINAL CLINICAL IMPRESSION(S) / ED DIAGNOSES  Final diagnoses:  Sepsis without acute organ dysfunction, due to unspecified organism [facility])  Community acquired pneumonia, unspecified laterality     ED Discharge Orders    None       Note:  This document was prepared using Dragon voice recognition software and may include unintentional dictation errors.   Blake Divine, MD 01/17/20 2145

## 2020-01-17 NOTE — ED Notes (Signed)
Pt states she was around sick family/children at Thanksgiving time.

## 2020-01-17 NOTE — ED Notes (Signed)
EMS called for code stroke.

## 2020-01-17 NOTE — ED Notes (Signed)
Kathleen Wallace, [phone] husband.

## 2020-01-17 NOTE — ED Notes (Signed)
Having difficulty obtaining labs, lab called to come obtain.

## 2020-01-17 NOTE — H&P (Addendum)
[place]   PATIENT NAME: Kathleen Wallace    MR#:  [id number]  DATE OF BIRTH:  02/25/1933  DATE OF ADMISSION:  01/17/2020  PRIMARY CARE PHYSICIAN: Einar Pheasant, MD   REQUESTING/REFERRING PHYSICIAN: Blake Divine, MD  CHIEF COMPLAINT:   Chief Complaint  Patient presents with  . Cough  . Fever    HISTORY OF PRESENT ILLNESS:  Kathleen Wallace  is a 86 y.o. Caucasian female with a known history of hypertension, GERD and reactive airway disease, who presented to the emergency room with acute onset of generalized weakness for the last several days.  She was seen in urgent care and was having expressive dysphagia and EMS was called due to possible stroke.  The patient reportedly was last seen at her baseline at 6 AM this morning per her husband.  She was noted to have a temperature of 100.5 and tachycardia 105 at urgent care.  When she was sent to the ER she was having chest discomfort as well as cough and dyspnea with malaise.  She admitted to having worsening dyspnea and cough with expectoration of yellowish sputum over the last month as well as associated wheezes.  She has been diagnosed with asthmatic bronchitis recently.  She has been having altered mental status with confusion that is currently significantly better.  She denies any fever or chills at home until today.  No nausea or vomiting or abdominal pain.  No dysuria, oliguria or hematuria or flank pain.  She has been vaccinated against COVID-19 and denied any recent exposure.  Upon presentation to the ER, temperature 100.5 with a blood pressure 133/63 and heart rate of 105 respiratory to 24 with pulse 70 of 89% on room air.  Later on temperature was 102 and blood pressure dropped to 80/49 with a MAP of 56.  After hydration blood pressure has improved to 129/75 and has remained stable.  Pulse symmetry was up to 95% on 3 L of O2 and has been ranging from 92 to 95%.  Labs revealed mild cytosis of 11.7 with a neutrophilia and anemia.   Procalcitonin was 3.55 and lactic acid 1.3.  CMP was unremarkable.  Influenza antigens and COVID-19 PCR came back negative.  Urinalysis was remarkable for 30 protein.  Blood cultures were drawn.  Portable chest ray showed right greater than left patchy airspace opacities suspicious for bilateral pneumonia. EKG showed sinus rhythm with a rate of 96 with premature atrial complexes and left bundle blanch block.  Repeat EKG revealed atrial fibrillation with controlled ventricular response 99 with left bundle blanch block  The patient was given 1 g of p.o. Tylenol, IV Rocephin and Zithromax and 1.75 L of IV lactated Ringer.  She will be admitted to a medical monitored bed for further evaluation and management.  PAST MEDICAL HISTORY:   Past Medical History:  Diagnosis Date  . Cancer ([place])    skin cancer nose  . Complication of anesthesia    woke up during partial hysterectomy  . Elevated blood pressure   . GERD (gastroesophageal reflux disease)    occ-no meds  . Headache    h/o   . Heart murmur    asymptomatic    PAST SURGICAL HISTORY:   Past Surgical History:  Procedure Laterality Date  . ABDOMINAL HYSTERECTOMY  1974   partial  . BREAST SURGERY     IMPLANTS  . CATARACT EXTRACTION W/PHACO Right 02/16/2018   Procedure: CATARACT EXTRACTION PHACO AND INTRAOCULAR LENS PLACEMENT (IOC) RIGHT;  Surgeon: Marchia Meiers, MD;  Location: [facility] ORS;  Service: Ophthalmology;  Laterality: Right;  [place]  01:10 CDE 11.02 Fluid pack lot # [id number]  . CATARACT EXTRACTION W/PHACO Left 03/18/2018   Procedure: CATARACT EXTRACTION PHACO AND INTRAOCULAR LENS PLACEMENT ([place]) LEFT;  Surgeon: Marchia Meiers, MD;  Location: [facility] ORS;  Service: Ophthalmology;  Laterality: Left;  [place] 01:06.5 CDE 10.80 FLUID PACK LOT # [id number]  . LUMBAR LAMINECTOMY/ DECOMPRESSION WITH MET-RX Bilateral 09/20/2018   Procedure: LUMBAR LAMINECTOMY/ DECOMPRESSION WITH MET-RX;  Surgeon: Deetta Perla, MD;  Location: [facility] ORS;  Service:  Neurosurgery;  Laterality: Bilateral;  . TONSILLECTOMY AND ADENOIDECTOMY  1944    SOCIAL HISTORY:   Social History   Tobacco Use  . Smoking status: Never Smoker  . Smokeless tobacco: Never Used  Substance Use Topics  . Alcohol use: No    Alcohol/week: 0.0 standard drinks    FAMILY HISTORY:   Family History  Problem Relation Age of Onset  . Stroke Mother   . Hypertension Mother   . Stroke Father   . Hypertension Father   . Diabetes Other     DRUG ALLERGIES:   Allergies  Allergen Reactions  . Other Anaphylaxis  . Codeine Nausea Only and Other (See Comments)    tachycardia  . Amoxicillin     insomnia Did it involve swelling of the face/tongue/throat, SOB, or low BP? No Did it involve sudden or severe rash/hives, skin peeling, or any reaction on the inside of your mouth or nose? No Did you need to seek medical attention at a hospital or doctor's office? No When did it last happen?years ago If all above answers are "NO", may proceed with cephalosporin use.     REVIEW OF SYSTEMS:   ROS As per history of present illness. All pertinent systems were reviewed above. Constitutional, HEENT, cardiovascular, respiratory, GI, GU, musculoskeletal, neuro, psychiatric, endocrine, integumentary and hematologic systems were reviewed and are otherwise negative/unremarkable except for positive findings mentioned above in the HPI.   MEDICATIONS AT HOME:   Prior to Admission medications   Medication Sig Start Date End Date Taking? Authorizing Provider  albuterol (VENTOLIN HFA) 108 (90 Base) MCG/ACT inhaler Inhale 1-2 puffs into the lungs every 6 (six) hours as needed for wheezing or shortness of breath. 12/23/19   Cook, Barnie Del, DO  BREO ELLIPTA 100-25 MCG/INH AEPB SMARTSIG:1 Inhalation Via Inhaler Daily 10/05/19   [provider]  Coenzyme Q10 (COQ10) 100 MG CAPS Take 100 mg by mouth daily.     [provider]  dorzolamidel-timolol (COSOPT) 22.3-6.8 MG/ML SOLN  ophthalmic solution Place 1 drop into both eyes 2 (two) times daily.    [provider]  doxycycline (VIBRAMYCIN) 100 MG capsule Take 1 capsule (100 mg total) by mouth 2 (two) times daily. 12/23/19   Coral Spikes, DO  fluticasone (FLOVENT HFA) 110 MCG/ACT inhaler  07/04/19   [provider]  fluticasone furoate-vilanterol (BREO ELLIPTA) 100-25 MCG/INH AEPB Inhale into the lungs. 10/05/19   [provider]  latanoprost (XALATAN) 0.005 % ophthalmic solution Place 1 drop into both eyes at bedtime.    [provider]  Multiple Vitamins-Minerals (MULTIVITAMIN WITH MINERALS) tablet Take 1 tablet by mouth daily.     [provider]  predniSONE (DELTASONE) 50 MG tablet 1 tablet daily x 5 days 12/23/19   Coral Spikes, DO  ROCKLATAN 0.02-0.005 % SOLN SMARTSIG:1 Drop(s) In [place]) Every Evening 05/24/19   [provider]  vitamin C (ASCORBIC ACID) 500 MG  tablet Take 500 mg by mouth daily.     [provider]      VITAL SIGNS:  Blood pressure 129/62, pulse 86, temperature 98.9 F (37.2 C), temperature source Oral, resp. rate (!) 24, height '5\' 1"'  (1.549 m), weight 54.4 kg, SpO2 92 %.  PHYSICAL EXAMINATION:  Physical Exam  GENERAL:  86 y.o.-year-old Caucasian female patient lying in the bed in mild respiratory distress with conversational dyspnea. EYES: Pupils equal, round, reactive to light and accommodation. No scleral icterus. Extraocular muscles intact.  HEENT: Head atraumatic, normocephalic. Oropharynx and nasopharynx clear.  NECK:  Supple, no jugular venous distention. No thyroid enlargement, no tenderness.  LUNGS: Diminished bibasal breath sounds with bibasal crackles and diffuse expiratory wheezes or rhonchi. CARDIOVASCULAR: Regular rate and rhythm, S1, S2 normal. No murmurs, rubs, or gallops.  ABDOMEN: Soft, nondistended, nontender. Bowel sounds present. No organomegaly or mass.  EXTREMITIES: No pedal edema, cyanosis, or clubbing.   NEUROLOGIC: Cranial nerves II through XII are intact. Muscle strength 5/5 in all extremities. Sensation intact. Gait not checked.  PSYCHIATRIC: The patient is alert and oriented x 3.  Normal affect and good eye contact. SKIN: No obvious rash, lesion, or ulcer.   LABORATORY PANEL:   CBC Recent Labs  Lab 01/17/20 1851  WBC 11.7*  HGB 10.7*  HCT 32.0*  PLT 201   ------------------------------------------------------------------------------------------------------------------  Chemistries  Recent Labs  Lab 01/17/20 1851  NA 135  K 4.0  CL 99  CO2 24  GLUCOSE 126*  BUN 22  CREATININE 0.75  CALCIUM 8.3*  AST 21  ALT 15  ALKPHOS 40  BILITOT 1.0   ------------------------------------------------------------------------------------------------------------------  Cardiac Enzymes No results for input(s): TROPONINI in the last 168 hours. ------------------------------------------------------------------------------------------------------------------  RADIOLOGY:  DG Chest Port 1 View  Result Date: 01/17/2020 CLINICAL DATA:  Cough short of breath EXAM: PORTABLE CHEST 1 VIEW COMPARISON:  12/19/2019 FINDINGS: Patchy right greater than left airspace opacities. Stable cardiomediastinal silhouette. No pneumothorax. Calcified breast implants. IMPRESSION: Patchy right greater than left airspace opacities suspicious for bilateral. Electronically Signed   By: Donavan Foil M.D.   On: 01/17/2020 19:04      IMPRESSION AND PLAN:   1.  Bilateral community-acquired pneumonia likely bacterial.  The patient has subsequent acute hypoxic respiratory failure as well as sepsis based on her fever, tachycardia and tachypnea and possibly severe sepsis given her altered mental status and hypotension.  She was initially hypotensive but responded to IV fluids and therefore is not currently in septic shock. -The patient will be admitted to a medical monitored bed. -We will continue IV antibiotic  therapy with Rocephin and Zithromax. -We will continue hydration with IV normal saline. -We will place her on mucolytic therapy as well as bronchodilator therapy. -We will obtain sputum culture and pneumonia antigens. -We will follow her blood cultures.  2.  Acute bronchospasm possibly with reactive airway disease likely acute asthmatic bronchitis associated with pneumonia.. -She will be placed on scheduled and as needed duo nebs. -We will hold off long-acting beta agonist at this time. -We will add steroid therapy with IV Solu-Medrol.   3.  GERD. -She will be placed on p.o. Pepcid.  4.  Essential hypertension. -She has been initially hypotensive and currently blood pressure is fairly controlled.  5.  Likely paroxysmal atrial fibrillation newly diagnosed with controlled ventricular response.   -We will check TSH and magnesium and continue monitoring. -2D echo will be obtained in a.m.  6.  Glaucoma. -We will continue her ophthalmic gtt.  7.  DVT prophylaxis. -Subcutaneous Lovenox.     All the records are reviewed and case discussed with ED provider. The plan of care was discussed in details with the patient (and family). I answered all questions. The patient agreed to proceed with the above mentioned plan. Further management will depend upon hospital course.   CODE STATUS: Full code  Status is: Inpatient  Remains inpatient appropriate because:Hemodynamically unstable, Ongoing active pain requiring inpatient pain management, Ongoing diagnostic testing needed not appropriate for outpatient work up, Unsafe d/c plan, IV treatments appropriate due to intensity of illness or inability to take PO and Inpatient level of care appropriate due to severity of illness   Dispo: The patient is from: Home              Anticipated d/c is to: Home              Anticipated d/c date is: 3 days              Patient currently is not medically stable to d/c.    TOTAL TIME TAKING CARE OF THIS  PATIENT: 55 minutes.    Christel Mormon M.D on 01/17/2020 at 9:37 PM  [facility]   From 7 PM-7 AM, contact night-coverage [email or web address]  CC: Primary care physician; Einar Pheasant, MD

## 2020-01-18 ENCOUNTER — Inpatient Hospital Stay
Admit: 2020-01-18 | Discharge: 2020-01-18 | Disposition: A | Discharge status: Home / Self Care | Payer: Medicare HMO | Attending: Family Medicine | Admitting: Family Medicine

## 2020-01-18 DIAGNOSIS — J189 Pneumonia, unspecified organism: Secondary | ICD-10-CM | POA: Diagnosis not present

## 2020-01-18 LAB — CBC
HCT: 32 % — ABNORMAL LOW (ref 36.0–46.0)
Hemoglobin: 11 g/dL — ABNORMAL LOW (ref 12.0–15.0)
MCH: 31.5 pg (ref 26.0–34.0)
MCHC: 34.4 g/dL (ref 30.0–36.0)
MCV: 91.7 fL (ref 80.0–100.0)
Platelets: 186 10*3/uL (ref 150–400)
RBC: 3.49 MIL/uL — ABNORMAL LOW (ref 3.87–5.11)
RDW: 13.4 % (ref 11.5–15.5)
WBC: 10.7 10*3/uL — ABNORMAL HIGH (ref 4.0–10.5)
nRBC: 0 % (ref 0.0–0.2)

## 2020-01-18 LAB — BASIC METABOLIC PANEL
Anion gap: 8 (ref 5–15)
BUN: 14 mg/dL (ref 8–23)
CO2: 24 mmol/L (ref 22–32)
Calcium: 8.1 mg/dL — ABNORMAL LOW (ref 8.9–10.3)
Chloride: 103 mmol/L (ref 98–111)
Creatinine, Ser: 0.56 mg/dL (ref 0.44–1.00)
GFR, Estimated: >60 mL/min (ref >60)
Glucose, Bld: 128 mg/dL — ABNORMAL HIGH (ref 70–99)
Potassium: 3.8 mmol/L (ref 3.5–5.1)
Sodium: 135 mmol/L (ref 135–145)

## 2020-01-18 LAB — ECHOCARDIOGRAM COMPLETE
AR max vel: 1.31 cm2
AV Area VTI: 1.18 cm2
AV Area mean vel: 1.32 cm2
AV Mean grad: 9 mmHg
AV Peak grad: 14.7 mmHg
Ao pk vel: 1.92 m/s
Area-P 1/2: 4.19 cm2
Height: 61 in
S' Lateral: 2.85 cm
Weight: 1920 oz

## 2020-01-18 LAB — LACTIC ACID, PLASMA
Lactic Acid, Venous: 1.3 mmol/L (ref 0.5–1.9)
Lactic Acid, Venous: 1.3 mmol/L (ref 0.5–1.9)

## 2020-01-18 LAB — PROCALCITONIN: Procalcitonin: 5.54 ng/mL

## 2020-01-18 LAB — TSH: TSH: 0.632 u[IU]/mL (ref 0.350–4.500)

## 2020-01-18 LAB — BRAIN NATRIURETIC PEPTIDE: B Natriuretic Peptide: 851.8 pg/mL — ABNORMAL HIGH (ref 0.0–100.0)

## 2020-01-18 MED ORDER — METHYLPREDNISOLONE SODIUM SUCC 40 MG IJ SOLR
40.0000 mg | Freq: Two times a day (BID) | INTRAMUSCULAR | Status: DC
  Administered 2020-01-18: 40 mg via INTRAVENOUS
  Filled 2020-01-18: qty 1

## 2020-01-18 MED ORDER — DORZOLAMIDE HCL-TIMOLOL MAL 2-0.5 % OP SOLN
1.0000 [drp] | Freq: Two times a day (BID) | OPHTHALMIC | Status: DC
  Administered 2020-01-18 – 2020-01-20 (×4): 1 [drp] via OPHTHALMIC
  Filled 2020-01-18 (×2): qty 10

## 2020-01-18 MED ORDER — METHYLPREDNISOLONE SODIUM SUCC 40 MG IJ SOLR
40.0000 mg | Freq: Three times a day (TID) | INTRAMUSCULAR | Status: DC
  Administered 2020-01-18: 40 mg via INTRAVENOUS
  Filled 2020-01-18: qty 1

## 2020-01-18 MED ORDER — APIXABAN 2.5 MG PO TABS
2.5000 mg | ORAL_TABLET | Freq: Two times a day (BID) | ORAL | Status: DC
  Administered 2020-01-18 – 2020-01-20 (×4): 2.5 mg via ORAL
  Filled 2020-01-18 (×4): qty 1

## 2020-01-18 NOTE — Consult Note (Signed)
CARDIOLOGY CONSULT NOTE               [name] MRN: [id number] DOB/AGE: 1933/10/28 86 y.o.  Admit date: 01/17/2020 Referring Physician Dr. Antonieta Pert Primary Physician Dr. Einar Pheasant  Primary Cardiologist Dr. Clayborn Bigness  Reason for Consultation New onset atrial fibrillation   HPI: Kathleen Wallace is an 86 year old female with a past medical history significant for HFrEF with an EF of 40-45%, mild to moderate aortic valve insufficiency, a LBBB, COPD, hypertension, and GERD who presented to the ED on 01/17/20 for generalized weakness, word finding difficulty, and altered mental status. She was seen at the urgent care earlier on in the day and EMS was activated due to concerns of a CVA.  Workup in the ED included a temperature of 100.5, ECG revealing sinus rhythm with a LBBB, WBC of 11.7, high sensitivity troponin 10 and 32 respectively, COVID-19 negative, and chest xray revealing patchy airspace opacities, concerning for bilateral pneumonia.  While admitted, she developed atrial fibrillation with RVR which warranted cardiology consultation.    She is followed in outpatient cardiology by Dr. Clayborn Bigness.  Echocardiogram on 11/08/19 revealed mildly to moderately reduced LV systolic function with an EF estimated between 40-45% with moderate TR, mild to moderate AI, mild MR, and septal wall hypokinesis - consistent with LBBB.  Stress test on 11/08/19 revealed good exercise tolerance achieving 6 minutes on Bruce protocol with an anterior/apical defect.   01/18/20: Kathleen Wallace is sitting up in bed, in no acute distress.  She reports an episode of chest pain occurring yesterday, that has since resolved.  She denies any prior history of atrial fibrillation.  She denies palpitations, heart racing, shortness of breath, lower extremity swelling, orthopnea, PND, or syncopal/presyncopal episodes. She denies any prior history of GI/intracranial bleed and is agreeable to begin Eliquis.   Review of  systems complete and found to be negative unless listed above     Past Medical History:  Diagnosis Date  . Cancer ([place])    skin cancer nose  . Complication of anesthesia    woke up during partial hysterectomy  . Elevated blood pressure   . GERD (gastroesophageal reflux disease)    occ-no meds  . Headache    h/o   . Heart murmur    asymptomatic    Past Surgical History:  Procedure Laterality Date  . ABDOMINAL HYSTERECTOMY  1974   partial  . BREAST SURGERY     IMPLANTS  . CATARACT EXTRACTION W/PHACO Right 02/16/2018   Procedure: CATARACT EXTRACTION PHACO AND INTRAOCULAR LENS PLACEMENT ([place]) RIGHT;  Surgeon: Marchia Meiers, MD;  Location: [facility] ORS;  Service: Ophthalmology;  Laterality: Right;  [place]  01:10 CDE 11.02 Fluid pack lot # [id number]  . CATARACT EXTRACTION W/PHACO Left 03/18/2018   Procedure: CATARACT EXTRACTION PHACO AND INTRAOCULAR LENS PLACEMENT ([place]) LEFT;  Surgeon: Marchia Meiers, MD;  Location: [facility] ORS;  Service: Ophthalmology;  Laterality: Left;  [place] 01:06.5 CDE 10.80 FLUID PACK LOT # [id number]  . LUMBAR LAMINECTOMY/ DECOMPRESSION WITH MET-RX Bilateral 09/20/2018   Procedure: LUMBAR LAMINECTOMY/ DECOMPRESSION WITH MET-RX;  Surgeon: Deetta Perla, MD;  Location: [facility] ORS;  Service: Neurosurgery;  Laterality: Bilateral;  . TONSILLECTOMY AND ADENOIDECTOMY  1944    Medications Prior to Admission  Medication Sig Dispense Refill Last Dose  . albuterol (VENTOLIN HFA) 108 (90 Base) MCG/ACT inhaler Inhale 1-2 puffs into the lungs every 6 (six) hours as needed for wheezing or shortness of breath. 18 g 0   .  BREO ELLIPTA 100-25 MCG/INH AEPB SMARTSIG:1 Inhalation Via Inhaler Daily     . Coenzyme Q10 (COQ10) 100 MG CAPS Take 100 mg by mouth daily.      . dorzolamidel-timolol (COSOPT) 22.3-6.8 MG/ML SOLN ophthalmic solution Place 1 drop into both eyes 2 (two) times daily.     Marland Kitchen doxycycline (VIBRAMYCIN) 100 MG capsule Take 1 capsule (100 mg total) by mouth 2 (two) times daily. 14 capsule  0   . fluticasone (FLOVENT HFA) 110 MCG/ACT inhaler      . fluticasone furoate-vilanterol (BREO ELLIPTA) 100-25 MCG/INH AEPB Inhale into the lungs.     . latanoprost (XALATAN) 0.005 % ophthalmic solution Place 1 drop into both eyes at bedtime.     . Multiple Vitamins-Minerals (MULTIVITAMIN WITH MINERALS) tablet Take 1 tablet by mouth daily.      . predniSONE (DELTASONE) 50 MG tablet 1 tablet daily x 5 days 5 tablet 0   . ROCKLATAN 0.02-0.005 % SOLN SMARTSIG:1 Drop(s) In Eye(s) Every Evening     . vitamin C (ASCORBIC ACID) 500 MG tablet Take 500 mg by mouth daily.       Social History   Socioeconomic History  . Marital status: Married    Spouse name: Not on file  . Number of children: Not on file  . Years of education: Not on file  . Highest education level: Not on file  Occupational History  . Not on file  Tobacco Use  . Smoking status: Never Smoker  . Smokeless tobacco: Never Used  Vaping Use  . Vaping Use: Never used  Substance and Sexual Activity  . Alcohol use: No    Alcohol/week: 0.0 standard drinks  . Drug use: No  . Sexual activity: Not on file  Other Topics Concern  . Not on file  Social History Narrative  . Not on file   Social Determinants of Health   Financial Resource Strain: Low Risk   . Difficulty of Paying Living Expenses: Not hard at all  Food Insecurity: No Food Insecurity  . Worried About Charity fundraiser in the Last Year: Never true  . Ran Out of Food in the Last Year: Never true  Transportation Needs: No Transportation Needs  . Lack of Transportation (Medical): No  . Lack of Transportation (Non-Medical): No  Physical Activity: Not on file  Stress: No Stress Concern Present  . Feeling of Stress : Not at all  Social Connections: Unknown  . Frequency of Communication with Friends and Family: Not on file  . Frequency of Social Gatherings with Friends and Family: Not on file  . Attends Religious Services: Not on file  . Active Member of Clubs or  Organizations: Not on file  . Attends Archivist Meetings: Not on file  . Marital Status: Married  Human resources officer Violence: Not At Risk  . Fear of Current or Ex-Partner: No  . Emotionally Abused: No  . Physically Abused: No  . Sexually Abused: No    Family History  Problem Relation Age of Onset  . Stroke Mother   . Hypertension Mother   . Stroke Father   . Hypertension Father   . Diabetes Other       Review of systems complete and found to be negative unless listed above      PHYSICAL EXAM  General: Well developed, well nourished, in no acute distress HEENT:  Normocephalic and atramatic Neck:  No JVD.  Lungs: Clear bilaterally to auscultation and percussion. Heart: HRRR . Normal  S1 and S2 without gallops or murmurs.  Abdomen: Bowel sounds are positive, abdomen soft and non-tender  Msk:  Back normal.  Normal strength and tone for age. Extremities: No clubbing, cyanosis or edema.   Neuro: Alert and oriented X 3. Psych:  Good affect, responds appropriately  Labs:   Lab Results  Component Value Date   WBC 10.7 (H) 01/18/2020   HGB 11.0 (L) 01/18/2020   HCT 32.0 (L) 01/18/2020   MCV 91.7 01/18/2020   PLT 186 01/18/2020    Recent Labs  Lab 01/17/20 1851 01/18/20 0521  NA 135 135  K 4.0 3.8  CL 99 103  CO2 24 24  BUN 22 14  CREATININE 0.75 0.56  CALCIUM 8.3* 8.1*  PROT 6.5  --   BILITOT 1.0  --   ALKPHOS 40  --   ALT 15  --   AST 21  --   GLUCOSE 126* 128*   Lab Results  Component Value Date   CKTOTAL 82 08/24/2011   CKMB 1.9 08/24/2011   TROPONINI < 0.02 08/24/2011    Lab Results  Component Value Date   CHOL 303 (H) 12/31/2017   CHOL 294 (H) 02/06/2017   CHOL 322 (H) 08/01/2016   Lab Results  Component Value Date   HDL 66.10 12/31/2017   HDL 81.40 02/06/2017   HDL 87.40 08/01/2016   Lab Results  Component Value Date   LDLCALC 211 (H) 12/31/2017   LDLCALC 199 (H) 02/06/2017   LDLCALC 220 (H) 08/01/2016   Lab Results   Component Value Date   TRIG 128.0 12/31/2017   TRIG 68.0 02/06/2017   TRIG 73.0 08/01/2016   Lab Results  Component Value Date   CHOLHDL 5 12/31/2017   CHOLHDL 4 02/06/2017   CHOLHDL 4 08/01/2016   No results found for: LDLDIRECT    Radiology: [facility] Chest Port 1 View  Result Date: 01/17/2020 CLINICAL DATA:  Cough short of breath EXAM: PORTABLE CHEST 1 VIEW COMPARISON:  12/19/2019 FINDINGS: Patchy right greater than left airspace opacities. Stable cardiomediastinal silhouette. No pneumothorax. Calcified breast implants. IMPRESSION: Patchy right greater than left airspace opacities suspicious for bilateral. Electronically Signed   By: Donavan Foil M.D.   On: 01/17/2020 19:04    EKG: Atrial fibrillation, rate of 99bpm, with a LBBB  Telemetry: Currently in NSR   ASSESSMENT AND PLAN:  1.  New onset atrial fibrillation   -Detected per ECG, rate controlled at 99bpm   -Currently in NSR on telemetry with rate in the 70s  -Will hold off on rate lowering medications at this time as her rate is well controlled and with recent episodes of hypotension   -With a CHA2D2s VASc score of 5, for age, female gender, history of CHF, and hypertension, will initiate Eliquis 2.72m BID due to weight and age   -Would recommend Holter monitoring at discharge to assess afib burden and to consider if long term anticoagulation is warranted   2.  History of HFrEF   -Appears euvolemic on exam; with reduced EF, estimated around 40-45%, would benefit from BB and ACE/ARB; will continue to follow BP and make further recommendations while admitted, or defer to outpatient setting    3.  COPD   -Continue current treatment plan   4.  Pneumonia  -Continue current treatment with abx   The history, physical exam findings, and plan of care were all discussed with Dr. KBartholome Bill and all decision making was made in collaboration.    Signed: NElmyra Ricks  L Jadae Steinke PA-C 01/18/2020, 1:10 PM

## 2020-01-18 NOTE — Sepsis Progress Note (Signed)
Notified bedside nurse of need to draw repeat lactic acid since the 2nd lactic acid was higher than the first per sepsis protocol.Marland Kitchen

## 2020-01-18 NOTE — Progress Notes (Signed)
*  PRELIMINARY RESULTS* Echocardiogram 2D Echocardiogram has been performed.  Kathleen Wallace Kathleen Wallace 01/18/2020, 2:49 PM

## 2020-01-18 NOTE — Progress Notes (Addendum)
PROGRESS NOTE    Kathleen SALSBERRY  DGU:[id number] DOB: January 27, 1933 DOA: 01/17/2020 PCP: Dale Frederick, MD   Chief Complaint  Patient presents with  . Cough  . Fever    Brief Narrative: 86 year old female with HTN, GERD, reactive airway disease presented to the ED with acute onset of generalized weakness for last several days, was seen at the urgent care and was having expressive dysphasia and EMS was called due to possible stroke As per the report the patient reportedly was last seen at her baseline at 6 AM this morning per her husband.  She was noted to have a temperature of 100.5 and tachycardia 105 at urgent care.  When she was sent to the ER she was having chest discomfort as well as cough and dyspnea with malaise.  She admitted to having worsening dyspnea and cough with expectoration of yellowish sputum over the last month as well as associated wheezes.  She has been diagnosed with asthmatic bronchitis recently.  She has been having altered mental status with confusion that is currently significantly better.  She denies any fever or chills at home until today.  No nausea or vomiting or abdominal pain.  No dysuria, oliguria or hematuria or flank pain.  She has been vaccinated against COVID-19 and denied any recent exposure.  Upon presentation to the ER, temperature 100.5 with a blood pressure 133/63 and heart rate of 105 respiratory to 24 with pulse 70 of 89% on room air.  Later on temperature was 102 and blood pressure dropped to 80/49 with a MAP of 56.  After hydration blood pressure has improved to 129/75 and has remained stable.  Pulse symmetry was up to 95% on 3 L of O2 and has been ranging from 92 to 95%.  Labs revealed mild cytosis of 11.7 with a neutrophilia and anemia.  Procalcitonin was 3.55 and lactic acid 1.3.  CMP was unremarkable.  Influenza antigens and COVID-19 PCR came back negative.  Urinalysis was remarkable for 30 protein.  Blood cultures were drawn.  Portable chest ray showed  right greater than left patchy airspace opacities suspicious for bilateral pneumonia. EKG showed sinus rhythm with a rate of 96 with premature atrial complexes and left bundle blanch block.  Repeat EKG revealed atrial fibrillation with controlled ventricular response 99 with left bundle blanch block  The patient was given 1 g of p.o. Tylenol, IV Rocephin and Zithromax and 1.75 L of IV lactated Ringer.  Patient is admitted for community-acquired pneumonia.  Subjective: Seen this morning.  She reports she is feeling much better today.  She is not coughing or short of breath today.   Assessment & Plan:  CAP bilateral with severe sepsis present on admission due to fever/tachycardia tachypnea, and x-ray finding of pneumonia.  Procalcitonin still up, lactic acidosis improved.  Continue empiric ceftriaxone/Zithromax and follow-up culture data.  Continue bronchodilators mucolytic's respiratory support.  Acute hypoxic respiratory failure due to #1 continue supplemental oxygen and wean as tolerated.  Given crackles on exam and bilateral opacity I will check BNP today and cxr for am-Echocardiogram is pending,??  CHF.  Acute bronchospasm: In the setting of #1 history of reactive airway disease.  On Solu-Medrol and bronchodilators and supplemental oxygen, wean down steroid to q12hr and will taper off quickly.  GERD: Continue Pepcid  HTN: Initially hypotensive in the ED.  BP is currently controlled.  Monitor.  New onset PAF noted in EKG. TSH normal,, TTE pending, monitoring telemetry and notify cardiology.    Glaucoma: continue  ophthalmic drops  Nutrition: Diet Order            Diet Heart Room service appropriate? Yes; Fluid consistency: Thin  Diet effective now                 Body mass index is 22.67 kg/m.  DVT prophylaxis: enoxaparin (LOVENOX) injection 40 mg Start: 01/17/20 2145 Code Status:   Code Status: Full Code  Family Communication: plan of care discussed with patient at  bedside.  Status is: Inpatient Remains inpatient appropriate because:For ongoing management of pneumonia with IV antibiotics  Dispo: The patient is from: Home              Anticipated d/c is to: Home              Anticipated d/c date is: 2 days              Patient currently is not medically stable to d/c.  Consultants:see note  Procedures:see note  Culture/Microbiology    Component Value Date/Time   SDES BLOOD RIGHT ANTECUBITAL 01/17/2020 1851   SDES BLOOD BLOOD LEFT FOREARM 01/17/2020 1851   SPECREQUEST  01/17/2020 1851    BOTTLES DRAWN AEROBIC AND ANAEROBIC Blood Culture adequate volume   SPECREQUEST  01/17/2020 1851    BOTTLES DRAWN AEROBIC AND ANAEROBIC Blood Culture results may not be optimal due to an inadequate volume of blood received in culture bottles   CULT  01/17/2020 1851    NO GROWTH < 12 HOURS Performed at [facility], [address] [place], [place] [address]    CULT  01/17/2020 1851    NO GROWTH < 12 HOURS Performed at [facility], [address] [place], [place] [address]    REPTSTATUS PENDING 01/17/2020 1851   REPTSTATUS PENDING 01/17/2020 1851    Other culture-see note  Medications: Scheduled Meds: . vitamin C  500 mg Oral Daily  . budesonide (PULMICORT) nebulizer solution  0.25 mg Nebulization BID  . dextromethorphan-guaiFENesin  1 tablet Oral BID  . dorzolamide-timolol  1 drop Both Eyes BID  . enoxaparin (LOVENOX) injection  40 mg Subcutaneous Q24H  . guaiFENesin  600 mg Oral BID  . ipratropium-albuterol  3 mL Nebulization QID  . latanoprost  1 drop Both Eyes QHS  . methylPREDNISolone (SOLU-MEDROL) injection  40 mg Intravenous Q8H  . multivitamin with minerals  1 tablet Oral Daily   Continuous Infusions: . sodium chloride 100 mL/hr at 01/18/20 0023  . azithromycin    . cefTRIAXone (ROCEPHIN)  IV     Antimicrobials: Anti-infectives (From admission, onward)   Start     Dose/Rate Route Frequency Ordered Stop    01/18/20 2100  azithromycin (ZITHROMAX) 500 mg in sodium chloride 0.9 % 250 mL IVPB        500 mg 250 mL/hr over 60 Minutes Intravenous Every 24 hours 01/17/20 2246 01/22/20 2059   01/18/20 2000  cefTRIAXone (ROCEPHIN) 2 g in sodium chloride 0.9 % 100 mL IVPB        2 g 200 mL/hr over 30 Minutes Intravenous Every 24 hours 01/17/20 2246 01/22/20 1959   01/17/20 2145  cefTRIAXone (ROCEPHIN) 2 g in sodium chloride 0.9 % 100 mL IVPB  Status:  Discontinued        2 g 200 mL/hr over 30 Minutes Intravenous Every 24 hours 01/17/20 2137 01/17/20 2246   01/17/20 2145  azithromycin (ZITHROMAX) 500 mg in sodium chloride 0.9 % 250 mL IVPB  Status:  Discontinued  500 mg 250 mL/hr over 60 Minutes Intravenous Every 24 hours 01/17/20 2137 01/17/20 2245   01/17/20 2000  cefTRIAXone (ROCEPHIN) 2 g in sodium chloride 0.9 % 100 mL IVPB  Status:  Discontinued        2 g 200 mL/hr over 30 Minutes Intravenous Every 24 hours 01/17/20 1948 01/18/20 0930   01/17/20 2000  azithromycin (ZITHROMAX) 500 mg in sodium chloride 0.9 % 250 mL IVPB  Status:  Discontinued        500 mg 250 mL/hr over 60 Minutes Intravenous Every 24 hours 01/17/20 1948 01/18/20 0930     Objective: Vitals: Today's Vitals   01/17/20 2340 01/18/20 0532 01/18/20 0753 01/18/20 0805  BP: (!) 132/58 (!) 148/60 129/65   Pulse: 88 84 75   Resp: 18 17 18    Temp: 98.7 F (37.1 C) 97.8 F (36.6 C) 97.9 F (36.6 C)   TempSrc: Oral  Oral   SpO2: 92% 93% 96%   Weight:      Height:      PainSc:    0-No pain   No intake or output data in the 24 hours ending 01/18/20 1017 Filed Weights   01/17/20 1825  Weight: 54.4 kg   Weight change:   Intake/Output from previous day: No intake/output data recorded. Intake/Output this shift: No intake/output data recorded.  Examination: General exam: AAOx3 ,NAD, weak appearing. HEENT:Oral mucosa moist, Ear/Nose WNL grossly,dentition normal. Respiratory system: bilaterally crackles no use of  accessory muscles Cardiovascular system: S1 & S2 +, regular, No JVD. Gastrointestinal system: Abdomen soft, NT,ND, BS+. Nervous System:Alert, awake, moving extremities and grossly nonfocal Extremities: No edema, distal peripheral pulses palpable.  Skin: No rashes,no icterus. MSK: Normal muscle bulk,tone, power  Data Reviewed: I have personally reviewed following labs and imaging studies CBC: Recent Labs  Lab 01/17/20 1851 01/18/20 0521  WBC 11.7* 10.7*  NEUTROABS 10.0*  --   HGB 10.7* 11.0*  HCT 32.0* 32.0*  MCV 93.3 91.7  PLT 201 [id number]   Basic Metabolic Panel: Recent Labs  Lab 01/17/20 1851 01/18/20 0521  NA 135 135  K 4.0 3.8  CL 99 103  CO2 24 24  GLUCOSE 126* 128*  BUN 22 14  CREATININE 0.75 0.56  CALCIUM 8.3* 8.1*  MG 1.8  --    GFR: Estimated Creatinine Clearance: 39.5 mL/min (by C-G formula based on SCr of 0.56 mg/dL). Liver Function Tests: Recent Labs  Lab 01/17/20 1851  AST 21  ALT 15  ALKPHOS 40  BILITOT 1.0  PROT 6.5  ALBUMIN 3.9   No results for input(s): LIPASE, AMYLASE in the last 168 hours. No results for input(s): AMMONIA in the last 168 hours. Coagulation Profile: Recent Labs  Lab 01/17/20 1851  INR 1.1   Cardiac Enzymes: No results for input(s): CKTOTAL, CKMB, CKMBINDEX, TROPONINI in the last 168 hours. BNP (last 3 results) No results for input(s): PROBNP in the last 8760 hours. HbA1C: No results for input(s): HGBA1C in the last 72 hours. CBG: Recent Labs  Lab 01/17/20 1725  GLUCAP 122*   Lipid Profile: No results for input(s): CHOL, HDL, LDLCALC, TRIG, CHOLHDL, LDLDIRECT in the last 72 hours. Thyroid Function Tests: Recent Labs    01/18/20 0521  TSH 0.632   Anemia Panel: No results for input(s): VITAMINB12, FOLATE, FERRITIN, TIBC, IRON, RETICCTPCT in the last 72 hours. Sepsis Labs: Recent Labs  Lab 01/17/20 1851 01/17/20 2100 01/18/20 0021 01/18/20 0253 01/18/20 0521  PROCALCITON 3.55  --   --   --  5.54   LATICACIDVEN 1.3 1.7 1.3 1.3  --     Recent Results (from the past 240 hour(s))  Blood Culture (routine x 2)     Status: None (Preliminary result)   Collection Time: 01/17/20  6:51 PM   Specimen: BLOOD  Result Value Ref Range Status   Specimen Description BLOOD RIGHT ANTECUBITAL  Final   Special Requests   Final    BOTTLES DRAWN AEROBIC AND ANAEROBIC Blood Culture adequate volume   Culture   Final    NO GROWTH < 12 HOURS Performed at [facility], [address] [place], [place] [address]    Report Status PENDING  Incomplete  Blood Culture (routine x 2)     Status: None (Preliminary result)   Collection Time: 01/17/20  6:51 PM   Specimen: BLOOD  Result Value Ref Range Status   Specimen Description BLOOD BLOOD LEFT FOREARM  Final   Special Requests   Final    BOTTLES DRAWN AEROBIC AND ANAEROBIC Blood Culture results may not be optimal due to an inadequate volume of blood received in culture bottles   Culture   Final    NO GROWTH < 12 HOURS Performed at [facility], [address], [place], [place] [address]    Report Status PENDING  Incomplete  Resp Panel by RT-PCR (Flu A&B, Covid) Nasopharyngeal Swab     Status: None   Collection Time: 01/17/20  6:51 PM   Specimen: Nasopharyngeal Swab; Nasopharyngeal(NP) swabs in vial transport medium  Result Value Ref Range Status   SARS Coronavirus 2 by RT PCR NEGATIVE NEGATIVE Final    Comment: (NOTE) SARS-CoV-2 target nucleic acids are NOT DETECTED.  The SARS-CoV-2 RNA is generally detectable in upper respiratory specimens during the acute phase of infection. The lowest concentration of SARS-CoV-2 viral copies this assay can detect is 138 copies/mL. A negative result does not preclude SARS-Cov-2 infection and should not be used as the sole basis for treatment or other patient management decisions. A negative result may occur with  improper specimen collection/handling, submission of specimen other than  nasopharyngeal swab, presence of viral mutation(s) within the areas targeted by this assay, and inadequate number of viral copies(<138 copies/mL). A negative result must be combined with clinical observations, patient history, and epidemiological information. The expected result is Negative.  Fact Sheet for Patients:  EntrepreneurPulse.com.au  Fact Sheet for Healthcare Providers:  IncredibleEmployment.be  This test is no t yet approved or cleared by the [place] FDA and  has been authorized for detection and/or diagnosis of SARS-CoV-2 by FDA under an Emergency Use Authorization (EUA). This EUA will remain  in effect (meaning this test can be used) for the duration of the COVID-19 declaration under Section 564(b)(1) of the Act, 21 U.S.C.section 360bbb-3(b)(1), unless the authorization is terminated  or revoked sooner.       Influenza A by PCR NEGATIVE NEGATIVE Final   Influenza B by PCR NEGATIVE NEGATIVE Final    Comment: (NOTE) The Xpert Xpress SARS-CoV-2/FLU/RSV plus assay is intended as an aid in the diagnosis of influenza from Nasopharyngeal swab specimens and should not be used as a sole basis for treatment. Nasal washings and aspirates are unacceptable for Xpert Xpress SARS-CoV-2/FLU/RSV testing.  Fact Sheet for Patients: EntrepreneurPulse.com.au  Fact Sheet for Healthcare Providers: IncredibleEmployment.be  This test is not yet approved or cleared by the [place] FDA and has been authorized for detection and/or diagnosis of SARS-CoV-2 by FDA under an Emergency Use Authorization (EUA). This  EUA will remain in effect (meaning this test can be used) for the duration of the COVID-19 declaration under Section 564(b)(1) of the Act, 21 U.S.C. section 360bbb-3(b)(1), unless the authorization is terminated or revoked.  Performed at [facility], [address], [place], [place]  [address]     Radiology Studies: DG Chest [place] 1 View  Result Date: 01/17/2020 CLINICAL DATA:  Cough short of breath EXAM: PORTABLE CHEST 1 VIEW COMPARISON:  12/19/2019 FINDINGS: Patchy right greater than left airspace opacities. Stable cardiomediastinal silhouette. No pneumothorax. Calcified breast implants. IMPRESSION: Patchy right greater than left airspace opacities suspicious for bilateral. Electronically Signed   By: Donavan Foil M.D.   On: 01/17/2020 19:04    LOS: 1 day   Antonieta Pert, MD [facility]  01/18/2020, 10:17 AM

## 2020-01-19 ENCOUNTER — Inpatient Hospital Stay: Payer: Medicare HMO

## 2020-01-19 DIAGNOSIS — J189 Pneumonia, unspecified organism: Secondary | ICD-10-CM | POA: Diagnosis not present

## 2020-01-19 LAB — URINE CULTURE: Culture: NO GROWTH

## 2020-01-19 LAB — CBC
HCT: 30 % — ABNORMAL LOW (ref 36.0–46.0)
Hemoglobin: 10.5 g/dL — ABNORMAL LOW (ref 12.0–15.0)
MCH: 31.5 pg (ref 26.0–34.0)
MCHC: 35 g/dL (ref 30.0–36.0)
MCV: 90.1 fL (ref 80.0–100.0)
Platelets: 210 10*3/uL (ref 150–400)
RBC: 3.33 MIL/uL — ABNORMAL LOW (ref 3.87–5.11)
RDW: 13.4 % (ref 11.5–15.5)
WBC: 15.6 10*3/uL — ABNORMAL HIGH (ref 4.0–10.5)
nRBC: 0 % (ref 0.0–0.2)

## 2020-01-19 LAB — BASIC METABOLIC PANEL
Anion gap: 6 (ref 5–15)
BUN: 17 mg/dL (ref 8–23)
CO2: 25 mmol/L (ref 22–32)
Calcium: 8.5 mg/dL — ABNORMAL LOW (ref 8.9–10.3)
Chloride: 107 mmol/L (ref 98–111)
Creatinine, Ser: 0.61 mg/dL (ref 0.44–1.00)
GFR, Estimated: >60 mL/min (ref >60)
Glucose, Bld: 158 mg/dL — ABNORMAL HIGH (ref 70–99)
Potassium: 4.5 mmol/L (ref 3.5–5.1)
Sodium: 138 mmol/L (ref 135–145)

## 2020-01-19 LAB — PROCALCITONIN: Procalcitonin: 4.65 ng/mL

## 2020-01-19 MED ORDER — METHYLPREDNISOLONE SODIUM SUCC 40 MG IJ SOLR
40.0000 mg | INTRAMUSCULAR | Status: DC
  Administered 2020-01-19: 40 mg via INTRAVENOUS
  Filled 2020-01-19: qty 1

## 2020-01-19 MED ORDER — AZITHROMYCIN 500 MG PO TABS
500.0000 mg | ORAL_TABLET | Freq: Every day | ORAL | Status: DC
  Administered 2020-01-19: 500 mg via ORAL
  Filled 2020-01-19: qty 1

## 2020-01-19 NOTE — Progress Notes (Signed)
PROGRESS NOTE    Kathleen Wallace  [id number] DOB: 1933-11-23 DOA: 01/17/2020 PCP: Einar Pheasant, MD   Chief Complaint  Patient presents with  . Cough  . Fever    Brief Narrative: 86 year old female with HTN, GERD, reactive airway disease presented to the ED with acute onset of generalized weakness for last several days, was seen at the urgent care and was having expressive dysphasia and EMS was called due to possible stroke As per the report the patient reportedly was last seen at her baseline at 6 AM this morning per her husband.  She was noted to have a temperature of 100.5 and tachycardia 105 at urgent care.  When she was sent to the ER she was having chest discomfort as well as cough and dyspnea with malaise.  She admitted to having worsening dyspnea and cough with expectoration of yellowish sputum over the last month as well as associated wheezes.  She has been diagnosed with asthmatic bronchitis recently.  She has been having altered mental status with confusion that is currently significantly better.  She denies any fever or chills at home until today.  No nausea or vomiting or abdominal pain.  No dysuria, oliguria or hematuria or flank pain.  She has been vaccinated against COVID-19 and denied any recent exposure.  Upon presentation to the ER, temperature 100.5 with a blood pressure 133/63 and heart rate of 105 respiratory to 24 with pulse 70 of 89% on room air.  Later on temperature was 102 and blood pressure dropped to 80/49 with a MAP of 56.  After hydration blood pressure has improved to 129/75 and has remained stable.  Pulse symmetry was up to 95% on 3 L of O2 and has been ranging from 92 to 95%.  Labs revealed mild cytosis of 11.7 with a neutrophilia and anemia.  Procalcitonin was 3.55 and lactic acid 1.3.  CMP was unremarkable.  Influenza antigens and COVID-19 PCR came back negative.  Urinalysis was remarkable for 30 protein.  Blood cultures were drawn.  Portable chest ray showed  right greater than left patchy airspace opacities suspicious for bilateral pneumonia. EKG showed sinus rhythm with a rate of 96 with premature atrial complexes and left bundle blanch block.  Repeat EKG revealed atrial fibrillation with controlled ventricular response 99 with left bundle blanch block  The patient was given 1 g of p.o. Tylenol, IV Rocephin and Zithromax and 1.75 L of IV lactated Ringer.  Patient is admitted for community-acquired pneumonia.  Subjective: Feels well, no orthopnea no leg swelling, husband at bedside Overnight no fever.  On 3 L nasal cannula. WBC up trended to 15.6K this morning. Repeat chest x-ray with progressive multifocal pulmonary infiltrates more focal within the right upper lobe. Episode of bradycardia this morning in telemetry.  Assessment & Plan:  CAP bilateral with severe sepsis present on admission , patient presented with fever/tachycardia tachypnea, and x-ray finding of pneumonia.  Procalcitonin still up 4-5, lactic acidosis improved.  WBC uptrending but afebrile could be from steroid effect vs worsening pneumonia, chest x-ray shows progressive multifocal pulmonary infiltrates more focal in the right upper lobe.  Patient is clinically feeling better-will keep on empiric ceftriaxone/Zithromax.  Blood culture no growth so far.  Strep and Legionella antigen pending.  Continue mucolytic supplemental oxygen bronchodilators.    Acute hypoxic respiratory failure due to #1 continue supplemental oxygen and wean as tolerated.  Given crackles on exam and bilateral opacity BNP and cxr ordered- BNP is up, but she had a  fib on admission.monitor volume status, echocardiogram 12/29 with EF 55 to 60%, no regional wall motion abnormalities, moderate LVH grade 1 DD, mitral and aortic valve grossly normal .  New onset PAF noted in EKG. TSH normal,, TTE with preserved EF, cardiology consulted -placed on Eliquis low-dose for now 2/2  High CHADS2VASCof  5 may need to be  monitored closely outpatient for A. fib burden. With episode of bradycardia in telemetry, cont to monitor.  History of CHF EF previously 40 to 45%, echo shows EF 55 to 60 % this admission.bnp is up. ?diuresis need if still hypoxic.bnp in am.  Acute bronchospasm: In the setting of #1 history of reactive airway disease.  On Solu-Medrol with quickly change to p.o., continue bronchodilators supplemental oxygen.   GERD: Continue Pepcid  HTN: Initially hypotensive in the ED. not on medication.  Glaucoma: continue ophthalmic drops  Nutrition: Diet Order            Diet Heart Room service appropriate? Yes; Fluid consistency: Thin  Diet effective now                 Body mass index is 22.67 kg/m.  DVT prophylaxis: apixaban (ELIQUIS) tablet 2.5 mg Start: 01/18/20 2000 Code Status:   Code Status: Full Code  Family Communication: plan of care discussed with patient and her husband at bedside.  Status is: Inpatient Remains inpatient appropriate because:For ongoing management of pneumonia with IV antibiotics  Dispo: The patient is from: Home              Anticipated d/c is to: Home.  Obtain PT OT evaluation              Anticipated d/c date is: 2 days              Patient currently is not medically stable to d/c.  Consultants:see note  Procedures:see note  Culture/Microbiology    Component Value Date/Time   SDES BLOOD RIGHT ANTECUBITAL 01/17/2020 1851   SDES BLOOD BLOOD LEFT FOREARM 01/17/2020 1851   SPECREQUEST  01/17/2020 1851    BOTTLES DRAWN AEROBIC AND ANAEROBIC Blood Culture adequate volume   SPECREQUEST  01/17/2020 1851    BOTTLES DRAWN AEROBIC AND ANAEROBIC Blood Culture results may not be optimal due to an inadequate volume of blood received in culture bottles   CULT  01/17/2020 1851    NO GROWTH 2 DAYS Performed at [facility], [address] [place] [place], [place] [address]    CULT  01/17/2020 1851    NO GROWTH 2 DAYS Performed at [facility], [address] [place], [place] [address]    REPTSTATUS PENDING 01/17/2020 1851   REPTSTATUS PENDING 01/17/2020 1851    Other culture-see note  Medications: Scheduled Meds: . apixaban  2.5 mg Oral BID  . vitamin C  500 mg Oral Daily  . budesonide (PULMICORT) nebulizer solution  0.25 mg Nebulization BID  . dextromethorphan-guaiFENesin  1 tablet Oral BID  . dorzolamide-timolol  1 drop Both Eyes BID  . guaiFENesin  600 mg Oral BID  . ipratropium-albuterol  3 mL Nebulization QID  . latanoprost  1 drop Both Eyes QHS  . methylPREDNISolone (SOLU-MEDROL) injection  40 mg Intravenous Q12H  . multivitamin with minerals  1 tablet Oral Daily   Continuous Infusions: . azithromycin 500 mg (01/19/20 0019)  . cefTRIAXone (ROCEPHIN)  IV 2 g (01/18/20 2342)   Antimicrobials: Anti-infectives (From admission, onward)   Start     Dose/Rate Route Frequency Ordered  Stop   01/18/20 2100  azithromycin (ZITHROMAX) 500 mg in sodium chloride 0.9 % 250 mL IVPB        500 mg 250 mL/hr over 60 Minutes Intravenous Every 24 hours 01/17/20 2246 01/22/20 2059   01/18/20 2000  cefTRIAXone (ROCEPHIN) 2 g in sodium chloride 0.9 % 100 mL IVPB        2 g 200 mL/hr over 30 Minutes Intravenous Every 24 hours 01/17/20 2246 01/22/20 1959   01/17/20 2145  cefTRIAXone (ROCEPHIN) 2 g in sodium chloride 0.9 % 100 mL IVPB  Status:  Discontinued        2 g 200 mL/hr over 30 Minutes Intravenous Every 24 hours 01/17/20 2137 01/17/20 2246   01/17/20 2145  azithromycin (ZITHROMAX) 500 mg in sodium chloride 0.9 % 250 mL IVPB  Status:  Discontinued        500 mg 250 mL/hr over 60 Minutes Intravenous Every 24 hours 01/17/20 2137 01/17/20 2245   01/17/20 2000  cefTRIAXone (ROCEPHIN) 2 g in sodium chloride 0.9 % 100 mL IVPB  Status:  Discontinued        2 g 200 mL/hr over 30 Minutes Intravenous Every 24 hours 01/17/20 1948 01/18/20 0930   01/17/20 2000  azithromycin (ZITHROMAX) 500 mg in sodium chloride 0.9 % 250 mL IVPB  Status:   Discontinued        500 mg 250 mL/hr over 60 Minutes Intravenous Every 24 hours 01/17/20 1948 01/18/20 0930     Objective: Vitals: Today's Vitals   01/18/20 2043 01/18/20 2341 01/19/20 0507 01/19/20 0807  BP: (!) 132/52 (!) 144/77  (!) 127/58  Pulse: 78 74  70  Resp: 16 17  16   Temp: 97.7 F (36.5 C) 97.8 F (36.6 C)  98.1 F (36.7 C)  TempSrc:      SpO2: 98% 96%  97%  Weight:      Height:      PainSc:   0-No pain     Intake/Output Summary (Last 24 hours) at 01/19/2020 0832 Last data filed at 01/18/2020 1900 Gross per 24 hour  Intake 240 ml  Output --  Net 240 ml   Filed Weights   01/17/20 1825  Weight: 54.4 kg   Weight change:   Intake/Output from previous day: 12/29 0701 - 12/30 0700 In: 240 [P.O.:240] Out: -  Intake/Output this shift: No intake/output data recorded.  Examination: General exam: AAOx3 , on nasal cannula 2 L, NAD, weak appearing. HEENT:Oral mucosa moist, Ear/Nose WNL grossly, dentition normal. Respiratory system: bilaterally scattered mild crackles and some wheezingno use of accessory muscle Cardiovascular system: S1 & S2 +, No JVD,. Gastrointestinal system: Abdomen soft, NT,ND, BS+ Nervous System:Alert, awake, moving extremities and grossly nonfocal Extremities: No edema, distal peripheral pulses palpable.  Skin: No rashes,no icterus. MSK: Normal muscle bulk,tone, power  Data Reviewed: I have personally reviewed following labs and imaging studies CBC: Recent Labs  Lab 01/17/20 1851 01/18/20 0521 01/19/20 0421  WBC 11.7* 10.7* 15.6*  NEUTROABS 10.0*  --   --   HGB 10.7* 11.0* 10.5*  HCT 32.0* 32.0* 30.0*  MCV 93.3 91.7 90.1  PLT 201 186 [id number]   Basic Metabolic Panel: Recent Labs  Lab 01/17/20 1851 01/18/20 0521 01/19/20 0421  NA 135 135 138  K 4.0 3.8 4.5  CL 99 103 107  CO2 24 24 25   GLUCOSE 126* 128* 158*  BUN 22 14 17   CREATININE 0.75 0.56 0.61  CALCIUM 8.3* 8.1* 8.5*  MG 1.8  --   --  GFR: Estimated Creatinine  Clearance: 39.5 mL/min (by C-G formula based on SCr of 0.61 mg/dL). Liver Function Tests: Recent Labs  Lab 01/17/20 1851  AST 21  ALT 15  ALKPHOS 40  BILITOT 1.0  PROT 6.5  ALBUMIN 3.9   No results for input(s): LIPASE, AMYLASE in the last 168 hours. No results for input(s): AMMONIA in the last 168 hours. Coagulation Profile: Recent Labs  Lab 01/17/20 1851  INR 1.1   Cardiac Enzymes: No results for input(s): CKTOTAL, CKMB, CKMBINDEX, TROPONINI in the last 168 hours. BNP (last 3 results) No results for input(s): PROBNP in the last 8760 hours. HbA1C: No results for input(s): HGBA1C in the last 72 hours. CBG: Recent Labs  Lab 01/17/20 1725  GLUCAP 122*   Lipid Profile: No results for input(s): CHOL, HDL, LDLCALC, TRIG, CHOLHDL, LDLDIRECT in the last 72 hours. Thyroid Function Tests: Recent Labs    01/18/20 0521  TSH 0.632   Anemia Panel: No results for input(s): VITAMINB12, FOLATE, FERRITIN, TIBC, IRON, RETICCTPCT in the last 72 hours. Sepsis Labs: Recent Labs  Lab 01/17/20 1851 01/17/20 2100 01/18/20 0021 01/18/20 0253 01/18/20 0521 01/19/20 0421  PROCALCITON 3.55  --   --   --  5.54 4.65  LATICACIDVEN 1.3 1.7 1.3 1.3  --   --     Recent Results (from the past 240 hour(s))  Blood Culture (routine x 2)     Status: None (Preliminary result)   Collection Time: 01/17/20  6:51 PM   Specimen: BLOOD  Result Value Ref Range Status   Specimen Description BLOOD RIGHT ANTECUBITAL  Final   Special Requests   Final    BOTTLES DRAWN AEROBIC AND ANAEROBIC Blood Culture adequate volume   Culture   Final    NO GROWTH 2 DAYS Performed at [facility], [address], [place], [place] [address]    Report Status PENDING  Incomplete  Blood Culture (routine x 2)     Status: None (Preliminary result)   Collection Time: 01/17/20  6:51 PM   Specimen: BLOOD  Result Value Ref Range Status   Specimen Description BLOOD BLOOD LEFT FOREARM  Final   Special Requests    Final    BOTTLES DRAWN AEROBIC AND ANAEROBIC Blood Culture results may not be optimal due to an inadequate volume of blood received in culture bottles   Culture   Final    NO GROWTH 2 DAYS Performed at [facility], [address], [place], [place] [address]    Report Status PENDING  Incomplete  Resp Panel by RT-PCR (Flu A&B, Covid) Nasopharyngeal Swab     Status: None   Collection Time: 01/17/20  6:51 PM   Specimen: Nasopharyngeal Swab; Nasopharyngeal(NP) swabs in vial transport medium  Result Value Ref Range Status   SARS Coronavirus 2 by RT PCR NEGATIVE NEGATIVE Final    Comment: (NOTE) SARS-CoV-2 target nucleic acids are NOT DETECTED.  The SARS-CoV-2 RNA is generally detectable in upper respiratory specimens during the acute phase of infection. The lowest concentration of SARS-CoV-2 viral copies this assay can detect is 138 copies/mL. A negative result does not preclude SARS-Cov-2 infection and should not be used as the sole basis for treatment or other patient management decisions. A negative result may occur with  improper specimen collection/handling, submission of specimen other than nasopharyngeal swab, presence of viral mutation(s) within the areas targeted by this assay, and inadequate number of viral copies(<138 copies/mL). A negative result must be combined with clinical observations, patient  history, and epidemiological information. The expected result is Negative.  Fact Sheet for Patients:  EntrepreneurPulse.com.au  Fact Sheet for Healthcare Providers:  IncredibleEmployment.be  This test is no t yet approved or cleared by the [place] FDA and  has been authorized for detection and/or diagnosis of SARS-CoV-2 by FDA under an Emergency Use Authorization (EUA). This EUA will remain  in effect (meaning this test can be used) for the duration of the COVID-19 declaration under Section 564(b)(1) of the Act,  21 U.S.C.section 360bbb-3(b)(1), unless the authorization is terminated  or revoked sooner.       Influenza A by PCR NEGATIVE NEGATIVE Final   Influenza B by PCR NEGATIVE NEGATIVE Final    Comment: (NOTE) The Xpert Xpress SARS-CoV-2/FLU/RSV plus assay is intended as an aid in the diagnosis of influenza from Nasopharyngeal swab specimens and should not be used as a sole basis for treatment. Nasal washings and aspirates are unacceptable for Xpert Xpress SARS-CoV-2/FLU/RSV testing.  Fact Sheet for Patients: EntrepreneurPulse.com.au  Fact Sheet for Healthcare Providers: IncredibleEmployment.be  This test is not yet approved or cleared by the [place] FDA and has been authorized for detection and/or diagnosis of SARS-CoV-2 by FDA under an Emergency Use Authorization (EUA). This EUA will remain in effect (meaning this test can be used) for the duration of the COVID-19 declaration under Section 564(b)(1) of the Act, 21 U.S.C. section 360bbb-3(b)(1), unless the authorization is terminated or revoked.  Performed at [facility], [address] [place], [place] [address]     Radiology Studies: DG Chest Rennert 1 View  Result Date: 01/19/2020 CLINICAL DATA:  Dyspnea EXAM: PORTABLE CHEST 1 VIEW COMPARISON:  01/17/2020 FINDINGS: Lung volumes are small, but are symmetric and pulmonary insufflation remain stable. Superimposed multifocal pulmonary infiltrate is again identified and has progressed, particularly within the right upper lobe. No pneumothorax or pleural effusion. Cardiac size within normal limits. Bilateral breast implants noted. No acute bone abnormality. IMPRESSION: Progressive multifocal pulmonary infiltrates, more focal within the right upper lobe, in keeping with changes of atypical infection in the acute setting. Electronically Signed   By: Fidela Salisbury MD   On: 01/19/2020 05:58   DG Chest Port 1 View  Result Date:  01/17/2020 CLINICAL DATA:  Cough short of breath EXAM: PORTABLE CHEST 1 VIEW COMPARISON:  12/19/2019 FINDINGS: Patchy right greater than left airspace opacities. Stable cardiomediastinal silhouette. No pneumothorax. Calcified breast implants. IMPRESSION: Patchy right greater than left airspace opacities suspicious for bilateral. Electronically Signed   By: Donavan Foil M.D.   On: 01/17/2020 19:04   ECHOCARDIOGRAM COMPLETE  Result Date: 01/18/2020    ECHOCARDIOGRAM REPORT   Patient Name:   RHEN MARCK Date of Exam: 01/18/2020 Medical Rec #:  [id number]    Height:       61.0 in Accession #:    [id number]   Weight:       120.0 lb Date of Birth:  08-10-1933    BSA:          1.520 m Patient Age:    62 years     BP:           126/60 mmHg Patient Gender: F            HR:           80 bpm. Exam Location:  [facility] Procedure: 2D Echo, Color Doppler, Cardiac Doppler and Strain Analysis Indications:     I48.91 Atrial fibrillation  History:  Patient has no prior history of Echocardiogram examinations.  Sonographer:     Charmayne Sheer RDCS (AE) Referring Phys:  [id number] Arvella Merles Dumas Diagnosing Phys: Bartholome Bill MD  Sonographer Comments: Suboptimal parasternal window. Image acquisition challenging due to breast implants. Global longitudinal strain was attempted. IMPRESSIONS  1. Left ventricular ejection fraction, by estimation, is 55 to 60%. The left ventricle has normal function. The left ventricle has no regional wall motion abnormalities. There is moderate left ventricular hypertrophy. Left ventricular diastolic parameters are consistent with Grade I diastolic dysfunction (impaired relaxation).  2. Right ventricular systolic function is normal. The right ventricular size is normal.  3. The mitral valve is grossly normal. Trivial mitral valve regurgitation.  4. The aortic valve is grossly normal. Aortic valve regurgitation is trivial. FINDINGS  Left Ventricle: Left ventricular ejection fraction, by estimation, is 55 to  60%. The left ventricle has normal function. The left ventricle has no regional wall motion abnormalities. The left ventricular internal cavity size was normal in size. There is  moderate left ventricular hypertrophy. Left ventricular diastolic parameters are consistent with Grade I diastolic dysfunction (impaired relaxation). Right Ventricle: The right ventricular size is normal. No increase in right ventricular wall thickness. Right ventricular systolic function is normal. Left Atrium: Left atrial size was normal in size. Right Atrium: Right atrial size was normal in size. Pericardium: There is no evidence of pericardial effusion. Mitral Valve: The mitral valve is grossly normal. Trivial mitral valve regurgitation. MV peak gradient, 6.2 mmHg. The mean mitral valve gradient is 3.0 mmHg. Tricuspid Valve: The tricuspid valve is grossly normal. Tricuspid valve regurgitation is trivial. Aortic Valve: The aortic valve is grossly normal. Aortic valve regurgitation is trivial. Aortic valve mean gradient measures 9.0 mmHg. Aortic valve peak gradient measures 14.7 mmHg. Aortic valve area, by VTI measures 1.18 cm. Pulmonic Valve: The pulmonic valve was not assessed. Pulmonic valve regurgitation is not visualized. Aorta: The aortic root is normal in size and structure. IAS/Shunts: The interatrial septum was not assessed.  LEFT VENTRICLE PLAX 2D LVIDd:         4.07 cm  Diastology LVIDs:         2.85 cm  LV e' medial:    5.33 cm/s LV PW:         1.05 cm  LV E/e' medial:  17.3 LV IVS:        0.88 cm  LV e' lateral:   6.53 cm/s LVOT diam:     1.90 cm  LV E/e' lateral: 14.1 LV SV:         46 LV SV Index:   31 LVOT Area:     2.84 cm  RIGHT VENTRICLE RV Basal diam:  2.52 cm LEFT ATRIUM             Index       RIGHT ATRIUM           Index LA Vol (A2C):   24.7 ml 16.25 ml/m RA Area:     10.90 cm LA Vol (A4C):   25.8 ml 16.97 ml/m RA Volume:   26.80 ml  17.63 ml/m LA Biplane Vol: 27.1 ml 17.83 ml/m  AORTIC VALVE                     PULMONIC VALVE AV Area (Vmax):    1.31 cm     PV Vmax:       0.85 m/s AV Area (Vmean):   1.32 cm  PV Vmean:      60.100 cm/s AV Area (VTI):     1.18 cm     PV VTI:        0.150 m AV Vmax:           192.00 cm/s  PV Peak grad:  2.9 mmHg AV Vmean:          137.000 cm/s PV Mean grad:  2.0 mmHg AV VTI:            0.395 m AV Peak Grad:      14.7 mmHg AV Mean Grad:      9.0 mmHg LVOT Vmax:         88.80 cm/s LVOT Vmean:        63.800 cm/s LVOT VTI:          0.164 m LVOT/AV VTI ratio: 0.42  AORTA Ao Root diam: 2.60 cm MITRAL VALVE MV Area (PHT): 4.19 cm     SHUNTS MV Peak grad:  6.2 mmHg     Systemic VTI:  0.16 m MV Mean grad:  3.0 mmHg     Systemic Diam: 1.90 cm MV Vmax:       1.25 m/s MV Vmean:      77.4 cm/s MV Decel Time: 181 msec MV E velocity: 92.10 cm/s MV A velocity: 131.00 cm/s MV E/A ratio:  0.70 Bartholome Bill MD Electronically signed by Bartholome Bill MD Signature Date/Time: 01/18/2020/4:55:43 PM    Final     LOS: 2 days   Antonieta Pert, MD [facility]  01/19/2020, 8:32 AM

## 2020-01-19 NOTE — Discharge Instructions (Signed)
Information on my medicine - ELIQUIS (apixaban)  This medication education was reviewed with me or my healthcare representative as part of my discharge preparation.    Why was Eliquis prescribed for you? Eliquis was prescribed for you to reduce the risk of forming blood clots that can cause a stroke if you have a medical condition called atrial fibrillation (a type of irregular heartbeat) OR to reduce the risk of a blood clots forming after orthopedic surgery.  What do You need to know about Eliquis ? Take your Eliquis 2.5mg  TWICE DAILY - one tablet in the morning and one tablet in the evening with or without food.  It would be best to take the doses about the same time each day.  If you have difficulty swallowing the tablet whole please discuss with your pharmacist how to take the medication safely.  Take Eliquis exactly as prescribed by your doctor and DO NOT stop taking Eliquis without talking to the doctor who prescribed the medication.  Stopping may increase your risk of developing a new clot or stroke.  Refill your prescription before you run out.  After discharge, you should have regular check-up appointments with your healthcare provider that is prescribing your Eliquis.  In the future your dose may need to be changed if your kidney function or weight changes by a significant amount or as you get older.  What do you do if you miss a dose? If you miss a dose, take it as soon as you remember on the same day and resume taking twice daily.  Do not take more than one dose of ELIQUIS at the same time.  Important Safety Information A possible side effect of Eliquis is bleeding. You should call your healthcare provider right away if you experience any of the following: ? Bleeding from an injury or your nose that does not stop. ? Unusual colored urine (red or dark brown) or unusual colored stools (red or black). ? Unusual bruising for unknown reasons. ? A serious fall or if you hit  your head (even if there is no bleeding).  Some medicines may interact with Eliquis and might increase your risk of bleeding or clotting while on Eliquis. To help avoid this, consult your healthcare provider or pharmacist prior to using any new prescription or non-prescription medications, including herbals, vitamins, non-steroidal anti-inflammatory drugs (NSAIDs) and supplements.  This website has more information on Eliquis (apixaban): [email or web address].com.cy.

## 2020-01-19 NOTE — Progress Notes (Signed)
[facility] Cardiology    SUBJECTIVE: Kathleen Wallace is an 86 year old female with a past medical history significant for HFrEF with an EF of 40-45%, mild to moderate aortic valve insufficiency, a LBBB, COPD, hypertension, and GERD who presented to the ED on 01/17/20 for generalized weakness and altered mental status. She was seen at the urgent care earlier on in the day and was send to the ED for further evaluation.  Workup in the ED included a temperature of 100.5, ECG revealing sinus rhythm with a LBBB, WBC of 11.7, high sensitivity troponin 10 and 32 respectively, COVID-19 negative, and chest xray revealing patchy airspace opacities, concerning for bilateral pneumonia.  While admitted, she developed atrial fibrillation which warranted cardiology consultation.    She is followed in outpatient cardiology by Dr. Clayborn Bigness.  Echocardiogram on 11/08/19 revealed mildly to moderately reduced LV systolic function with an EF estimated between 40-45% with moderate TR, mild to moderate AI, mild MR, and septal wall hypokinesis - consistent with LBBB.  Stress test on 11/08/19 revealed good exercise tolerance achieving 6 minutes on Bruce protocol with an anterior/apical defect.   01/18/20: Kathleen Wallace is sitting up in bed, in no acute distress.  She reports an episode of chest pain occurring yesterday, that has since resolved.  She denies any prior history of atrial fibrillation.  She denies palpitations, heart racing, shortness of breath, lower extremity swelling, orthopnea, PND, or syncopal/presyncopal episodes. She denies any prior history of GI/intracranial bleed and is agreeable to begin Eliquis.   01/19/20: Continues to do well.  Denies chest pain, palpitations, heart racing, shortness of breath, lower extremity swelling, orthopnea, PND, or syncopal/presyncopal episodes.     Vitals:   01/18/20 1216 01/18/20 1609 01/18/20 2043 01/18/20 2341  BP: 126/60 130/63 (!) 132/52 (!) 144/77  Pulse: 72 78 78 74  Resp: 16 15 16 17    Temp: 98.1 F (36.7 C) 97.9 F (36.6 C) 97.7 F (36.5 C) 97.8 F (36.6 C)  TempSrc: Oral Oral    SpO2: 96% 94% 98% 96%  Weight:      Height:         Intake/Output Summary (Last 24 hours) at 01/19/2020 0801 Last data filed at 01/18/2020 1900 Gross per 24 hour  Intake 240 ml  Output --  Net 240 ml      PHYSICAL EXAM  General: Well developed, well nourished, in no acute distress HEENT:  Normocephalic and atramatic Neck:  No JVD.  Lungs: Clear bilaterally to auscultation and percussion. Heart: HRRR . Normal S1 and S2 without gallops or murmurs.  Abdomen: Bowel sounds are positive, abdomen soft and non-tender  Msk:  Back normal. Normal strength and tone for age. Extremities: No clubbing, cyanosis or edema.   Neuro: Alert and oriented X 3. Psych:  Good affect, responds appropriately   LABS: Basic Metabolic Panel: Recent Labs    01/17/20 1851 01/18/20 0521 01/19/20 0421  NA 135 135 138  K 4.0 3.8 4.5  CL 99 103 107  CO2 24 24 25   GLUCOSE 126* 128* 158*  BUN 22 14 17   CREATININE 0.75 0.56 0.61  CALCIUM 8.3* 8.1* 8.5*  MG 1.8  --   --    Liver Function Tests: Recent Labs    01/17/20 1851  AST 21  ALT 15  ALKPHOS 40  BILITOT 1.0  PROT 6.5  ALBUMIN 3.9   No results for input(s): LIPASE, AMYLASE in the last 72 hours. CBC: Recent Labs    01/17/20 1851 01/18/20 0521  01/19/20 0421  WBC 11.7* 10.7* 15.6*  NEUTROABS 10.0*  --   --   HGB 10.7* 11.0* 10.5*  HCT 32.0* 32.0* 30.0*  MCV 93.3 91.7 90.1  PLT 201 186 210   Cardiac Enzymes: No results for input(s): CKTOTAL, CKMB, CKMBINDEX, TROPONINI in the last 72 hours. BNP: Invalid input(s): POCBNP D-Dimer: No results for input(s): DDIMER in the last 72 hours. Hemoglobin A1C: No results for input(s): HGBA1C in the last 72 hours. Fasting Lipid Panel: No results for input(s): CHOL, HDL, LDLCALC, TRIG, CHOLHDL, LDLDIRECT in the last 72 hours. Thyroid Function Tests: Recent Labs    01/18/20 0521  TSH  0.632   Anemia Panel: No results for input(s): VITAMINB12, FOLATE, FERRITIN, TIBC, IRON, RETICCTPCT in the last 72 hours.  DG Chest Port 1 View  Result Date: 01/19/2020 CLINICAL DATA:  Dyspnea EXAM: PORTABLE CHEST 1 VIEW COMPARISON:  01/17/2020 FINDINGS: Lung volumes are small, but are symmetric and pulmonary insufflation remain stable. Superimposed multifocal pulmonary infiltrate is again identified and has progressed, particularly within the right upper lobe. No pneumothorax or pleural effusion. Cardiac size within normal limits. Bilateral breast implants noted. No acute bone abnormality. IMPRESSION: Progressive multifocal pulmonary infiltrates, more focal within the right upper lobe, in keeping with changes of atypical infection in the acute setting. Electronically Signed   By: Fidela Salisbury MD   On: 01/19/2020 05:58   DG Chest Port 1 View  Result Date: 01/17/2020 CLINICAL DATA:  Cough short of breath EXAM: PORTABLE CHEST 1 VIEW COMPARISON:  12/19/2019 FINDINGS: Patchy right greater than left airspace opacities. Stable cardiomediastinal silhouette. No pneumothorax. Calcified breast implants. IMPRESSION: Patchy right greater than left airspace opacities suspicious for bilateral. Electronically Signed   By: Donavan Foil M.D.   On: 01/17/2020 19:04   ECHOCARDIOGRAM COMPLETE  Result Date: 01/18/2020    ECHOCARDIOGRAM REPORT   Patient Name:   Kathleen Wallace Date of Exam: 01/18/2020 Medical Rec #:  [id number]    Height:       61.0 in Accession #:    [id number]   Weight:       120.0 lb Date of Birth:  1933/09/07    BSA:          1.520 m Patient Age:    53 years     BP:           126/60 mmHg Patient Gender: F            HR:           80 bpm. Exam Location:  [facility] Procedure: 2D Echo, Color Doppler, Cardiac Doppler and Strain Analysis Indications:     I48.91 Atrial fibrillation  History:         Patient has no prior history of Echocardiogram examinations.  Sonographer:     Charmayne Sheer RDCS (AE) Referring  Phys:  [id number] Arvella Merles [place] Diagnosing Phys: Bartholome Bill MD  Sonographer Comments: Suboptimal parasternal window. Image acquisition challenging due to breast implants. Global longitudinal strain was attempted. IMPRESSIONS  1. Left ventricular ejection fraction, by estimation, is 55 to 60%. The left ventricle has normal function. The left ventricle has no regional wall motion abnormalities. There is moderate left ventricular hypertrophy. Left ventricular diastolic parameters are consistent with Grade I diastolic dysfunction (impaired relaxation).  2. Right ventricular systolic function is normal. The right ventricular size is normal.  3. The mitral valve is grossly normal. Trivial mitral valve regurgitation.  4. The aortic valve is grossly normal. Aortic valve regurgitation  is trivial. FINDINGS  Left Ventricle: Left ventricular ejection fraction, by estimation, is 55 to 60%. The left ventricle has normal function. The left ventricle has no regional wall motion abnormalities. The left ventricular internal cavity size was normal in size. There is  moderate left ventricular hypertrophy. Left ventricular diastolic parameters are consistent with Grade I diastolic dysfunction (impaired relaxation). Right Ventricle: The right ventricular size is normal. No increase in right ventricular wall thickness. Right ventricular systolic function is normal. Left Atrium: Left atrial size was normal in size. Right Atrium: Right atrial size was normal in size. Pericardium: There is no evidence of pericardial effusion. Mitral Valve: The mitral valve is grossly normal. Trivial mitral valve regurgitation. MV peak gradient, 6.2 mmHg. The mean mitral valve gradient is 3.0 mmHg. Tricuspid Valve: The tricuspid valve is grossly normal. Tricuspid valve regurgitation is trivial. Aortic Valve: The aortic valve is grossly normal. Aortic valve regurgitation is trivial. Aortic valve mean gradient measures 9.0 mmHg. Aortic valve peak gradient measures  14.7 mmHg. Aortic valve area, by VTI measures 1.18 cm. Pulmonic Valve: The pulmonic valve was not assessed. Pulmonic valve regurgitation is not visualized. Aorta: The aortic root is normal in size and structure. IAS/Shunts: The interatrial septum was not assessed.  LEFT VENTRICLE PLAX 2D LVIDd:         4.07 cm  Diastology LVIDs:         2.85 cm  LV e' medial:    5.33 cm/s LV PW:         1.05 cm  LV E/e' medial:  17.3 LV IVS:        0.88 cm  LV e' lateral:   6.53 cm/s LVOT diam:     1.90 cm  LV E/e' lateral: 14.1 LV SV:         46 LV SV Index:   31 LVOT Area:     2.84 cm  RIGHT VENTRICLE RV Basal diam:  2.52 cm LEFT ATRIUM             Index       RIGHT ATRIUM           Index LA Vol (A2C):   24.7 ml 16.25 ml/m RA Area:     10.90 cm LA Vol (A4C):   25.8 ml 16.97 ml/m RA Volume:   26.80 ml  17.63 ml/m LA Biplane Vol: 27.1 ml 17.83 ml/m  AORTIC VALVE                    PULMONIC VALVE AV Area (Vmax):    1.31 cm     PV Vmax:       0.85 m/s AV Area (Vmean):   1.32 cm     PV Vmean:      60.100 cm/s AV Area (VTI):     1.18 cm     PV VTI:        0.150 m AV Vmax:           192.00 cm/s  PV Peak grad:  2.9 mmHg AV Vmean:          137.000 cm/s PV Mean grad:  2.0 mmHg AV VTI:            0.395 m AV Peak Grad:      14.7 mmHg AV Mean Grad:      9.0 mmHg LVOT Vmax:         88.80 cm/s LVOT Vmean:        63.800 cm/s LVOT  VTI:          0.164 m LVOT/AV VTI ratio: 0.42  AORTA Ao Root diam: 2.60 cm MITRAL VALVE MV Area (PHT): 4.19 cm     SHUNTS MV Peak grad:  6.2 mmHg     Systemic VTI:  0.16 m MV Mean grad:  3.0 mmHg     Systemic Diam: 1.90 cm MV Vmax:       1.25 m/s MV Vmean:      77.4 cm/s MV Decel Time: 181 msec MV E velocity: 92.10 cm/s MV A velocity: 131.00 cm/s MV E/A ratio:  0.70 Bartholome Bill MD Electronically signed by Bartholome Bill MD Signature Date/Time: 01/18/2020/4:55:43 PM    Final      Echo: Normal LV systolic function with an EF estimated between 55-60% with moderate LVH; grade 1 diastolic dysfunction; no  significant valvular abnormalities   TELEMETRY: Normal sinus rhythm   ASSESSMENT AND PLAN: 1.  New onset atrial fibrillation              -Detected per ECG, rate controlled at 99bpm              -Currently in NSR on telemetry with rate in the 70s             -Will hold off on rate lowering medications at this time as her rate is well controlled              -With a CHA2D2s VASc score of 5, for age, female gender, history of CHF, and hypertension, Eliquis 2.5mg  BID initiated this admission              -Would recommend Holter monitoring with Dr. Clayborn Bigness in an outpatient setting to assess afib burden and determine if long term anticoagulation is indicated   -No further cardiac workup indicated at this time; will follow as needed; recommend follow up with Dr. Clayborn Bigness within 7-10 days of discharge   2.  History of HFrEF              -Appears euvolemic on exam; echocardiogram this admission  revealing normal LV systolic function with an EF estimated between 55-60% with moderate LVH; grade 1 diastolic dysfunction   3.  COPD              -Continue current treatment plan   4.  Pneumonia             -Continue current treatment with abx   Active Problems:   CAP (community acquired pneumonia)    The history, physical exam findings, and plan of care were all discussed with Dr. Bartholome Bill, and all decision making was made in collaboration.    Avie Arenas PA-C  01/19/2020 8:01 AM

## 2020-01-20 DIAGNOSIS — J189 Pneumonia, unspecified organism: Secondary | ICD-10-CM | POA: Diagnosis not present

## 2020-01-20 LAB — BASIC METABOLIC PANEL
Anion gap: 12 (ref 5–15)
BUN: 17 mg/dL (ref 8–23)
CO2: 22 mmol/L (ref 22–32)
Calcium: 8.3 mg/dL — ABNORMAL LOW (ref 8.9–10.3)
Chloride: 104 mmol/L (ref 98–111)
Creatinine, Ser: 0.73 mg/dL (ref 0.44–1.00)
GFR, Estimated: >60 mL/min (ref >60)
Glucose, Bld: 132 mg/dL — ABNORMAL HIGH (ref 70–99)
Potassium: 4.2 mmol/L (ref 3.5–5.1)
Sodium: 138 mmol/L (ref 135–145)

## 2020-01-20 LAB — CBC
HCT: 32.8 % — ABNORMAL LOW (ref 36.0–46.0)
Hemoglobin: 11.5 g/dL — ABNORMAL LOW (ref 12.0–15.0)
MCH: 32 pg (ref 26.0–34.0)
MCHC: 35.1 g/dL (ref 30.0–36.0)
MCV: 91.4 fL (ref 80.0–100.0)
Platelets: 309 10*3/uL (ref 150–400)
RBC: 3.59 MIL/uL — ABNORMAL LOW (ref 3.87–5.11)
RDW: 13.4 % (ref 11.5–15.5)
WBC: 17.7 10*3/uL — ABNORMAL HIGH (ref 4.0–10.5)
nRBC: 0 % (ref 0.0–0.2)

## 2020-01-20 LAB — BRAIN NATRIURETIC PEPTIDE: B Natriuretic Peptide: 1020.3 pg/mL — ABNORMAL HIGH (ref 0.0–100.0)

## 2020-01-20 LAB — PROCALCITONIN: Procalcitonin: 2.11 ng/mL

## 2020-01-20 MED ORDER — APIXABAN 2.5 MG PO TABS: 2.5000 mg | ORAL_TABLET | Freq: Two times a day (BID) | ORAL | 0 refills | Status: DC

## 2020-01-20 MED ORDER — CEFDINIR 300 MG PO CAPS: 300.0000 mg | ORAL_CAPSULE | Freq: Two times a day (BID) | ORAL | 0 refills | Status: AC

## 2020-01-20 MED ORDER — FUROSEMIDE 10 MG/ML IJ SOLN
20.0000 mg | Freq: Once | INTRAMUSCULAR | Status: AC
  Administered 2020-01-20: 20 mg via INTRAVENOUS
  Filled 2020-01-20: qty 4

## 2020-01-20 MED ORDER — PREDNISONE 50 MG PO TABS: ORAL_TABLET | ORAL | 0 refills | Status: DC

## 2020-01-20 MED ORDER — AZITHROMYCIN 500 MG PO TABS: 500.0000 mg | ORAL_TABLET | Freq: Every day | ORAL | 0 refills | Status: AC

## 2020-01-20 MED ORDER — PREDNISONE 10 MG PO TABS: 10.0000 mg | ORAL_TABLET | Freq: Every day | ORAL | Status: DC

## 2020-01-20 MED ORDER — PREDNISONE 20 MG PO TABS
20.0000 mg | ORAL_TABLET | Freq: Every day | ORAL | Status: AC
  Administered 2020-01-20: 20 mg via ORAL
  Filled 2020-01-20: qty 1

## 2020-01-20 MED ORDER — PREDNISONE 10 MG PO TABS: 10.0000 mg | ORAL_TABLET | Freq: Every day | ORAL | 0 refills | Status: AC

## 2020-01-20 NOTE — Plan of Care (Signed)
  Problem: Activity: Goal: Ability to tolerate increased activity will improve Outcome: Adequate for Discharge   Problem: Clinical Measurements: Goal: Ability to maintain a body temperature in the normal range will improve Outcome: Adequate for Discharge   Problem: Respiratory: Goal: Ability to maintain adequate ventilation will improve Outcome: Adequate for Discharge Goal: Ability to maintain a clear airway will improve Outcome: Adequate for Discharge   Problem: Fluid Volume: Goal: Hemodynamic stability will improve Outcome: Adequate for Discharge   Problem: Clinical Measurements: Goal: Diagnostic test results will improve Outcome: Adequate for Discharge Goal: Signs and symptoms of infection will decrease Outcome: Adequate for Discharge   Problem: Respiratory: Goal: Ability to maintain adequate ventilation will improve Outcome: Adequate for Discharge   Problem: Education: Goal: Knowledge of General Education information will improve Description: Including pain rating scale, medication(s)/side effects and non-pharmacologic comfort measures Outcome: Adequate for Discharge   Problem: Health Behavior/Discharge Planning: Goal: Ability to manage health-related needs will improve Outcome: Adequate for Discharge   Problem: Clinical Measurements: Goal: Ability to maintain clinical measurements within normal limits will improve Outcome: Adequate for Discharge Goal: Will remain free from infection Outcome: Adequate for Discharge Goal: Diagnostic test results will improve Outcome: Adequate for Discharge Goal: Respiratory complications will improve Outcome: Adequate for Discharge Goal: Cardiovascular complication will be avoided Outcome: Adequate for Discharge   Problem: Activity: Goal: Risk for activity intolerance will decrease Outcome: Adequate for Discharge   Problem: Nutrition: Goal: Adequate nutrition will be maintained Outcome: Adequate for Discharge   Problem:  Coping: Goal: Level of anxiety will decrease Outcome: Adequate for Discharge   Problem: Elimination: Goal: Will not experience complications related to bowel motility Outcome: Adequate for Discharge Goal: Will not experience complications related to urinary retention Outcome: Adequate for Discharge   Problem: Pain Managment: Goal: General experience of comfort will improve Outcome: Adequate for Discharge   Problem: Safety: Goal: Ability to remain free from injury will improve Outcome: Adequate for Discharge   Problem: Skin Integrity: Goal: Risk for impaired skin integrity will decrease Outcome: Adequate for Discharge

## 2020-01-20 NOTE — Progress Notes (Signed)
[facility] Cardiology    SUBJECTIVE: States to be doing much better reduced shortness of breath no dyspnea.  Denies any recent fever chills or sweats feels much improved with dose cough reduced shortness of breath tolerating antibiotics and inhalers well.   Vitals:   01/19/20 2121 01/19/20 2348 01/20/20 0458 01/20/20 0815  BP: (!) 154/75 (!) 142/67 (!) 152/75 (!) 147/70  Pulse: 76 69 87 70  Resp: 16 16 17 17   Temp: 97.6 F (36.4 C) 97.8 F (36.6 C) 98 F (36.7 C) 98.1 F (36.7 C)  TempSrc:      SpO2: 95% 95% 95% 98%  Weight:      Height:         Intake/Output Summary (Last 24 hours) at 01/20/2020 [id number] Last data filed at 01/19/2020 1856 Gross per 24 hour  Intake 240 ml  Output --  Net 240 ml      PHYSICAL EXAM  General: Well developed, well nourished, in no acute distress Chest pain:  Normocephalic and atramatic Neck:  No JVD.  Lungs: Clear bilaterally to auscultation and percussion. Heart: Irregular irregular. Normal S1 and S2 without gallops or murmurs.  Abdomen: Bowel sounds are positive, abdomen soft and non-tender  Msk:  Back normal, normal gait. Normal strength and tone for age. Extremities: No clubbing, cyanosis or edema.   Neuro: Alert and oriented X 3. Psych:  Good affect, responds appropriately   LABS: Basic Metabolic Panel: Recent Labs    01/17/20 1851 01/18/20 0521 01/19/20 0421 01/20/20 0509  NA 135   < > 138 138  K 4.0   < > 4.5 4.2  CL 99   < > 107 104  CO2 24   < > 25 22  GLUCOSE 126*   < > 158* 132*  BUN 22   < > 17 17  CREATININE 0.75   < > 0.61 0.73  CALCIUM 8.3*   < > 8.5* 8.3*  MG 1.8  --   --   --    < > = values in this interval not displayed.   Liver Function Tests: Recent Labs    01/17/20 1851  AST 21  ALT 15  ALKPHOS 40  BILITOT 1.0  PROT 6.5  ALBUMIN 3.9   No results for input(s): LIPASE, AMYLASE in the last 72 hours. CBC: Recent Labs    01/17/20 1851 01/18/20 0521 01/19/20 0421 01/20/20 0509  WBC 11.7*   < >  15.6* 17.7*  NEUTROABS 10.0*  --   --   --   HGB 10.7*   < > 10.5* 11.5*  HCT 32.0*   < > 30.0* 32.8*  MCV 93.3   < > 90.1 91.4  PLT 201   < > 210 309   < > = values in this interval not displayed.   Cardiac Enzymes: No results for input(s): CKTOTAL, CKMB, CKMBINDEX, TROPONINI in the last 72 hours. BNP: Invalid input(s): POCBNP D-Dimer: No results for input(s): DDIMER in the last 72 hours. Hemoglobin A1C: No results for input(s): HGBA1C in the last 72 hours. Fasting Lipid Panel: No results for input(s): CHOL, HDL, LDLCALC, TRIG, CHOLHDL, LDLDIRECT in the last 72 hours. Thyroid Function Tests: Recent Labs    01/18/20 0521  TSH 0.632   Anemia Panel: No results for input(s): VITAMINB12, FOLATE, FERRITIN, TIBC, IRON, RETICCTPCT in the last 72 hours.  DG Chest Port 1 View  Result Date: 01/19/2020 CLINICAL DATA:  Dyspnea EXAM: PORTABLE CHEST 1 VIEW COMPARISON:  01/17/2020 FINDINGS: Lung volumes are  small, but are symmetric and pulmonary insufflation remain stable. Superimposed multifocal pulmonary infiltrate is again identified and has progressed, particularly within the right upper lobe. No pneumothorax or pleural effusion. Cardiac size within normal limits. Bilateral breast implants noted. No acute bone abnormality. IMPRESSION: Progressive multifocal pulmonary infiltrates, more focal within the right upper lobe, in keeping with changes of atypical infection in the acute setting. Electronically Signed   By: Helyn Numbers MD   On: 01/19/2020 05:58   ECHOCARDIOGRAM COMPLETE  Result Date: 01/18/2020    ECHOCARDIOGRAM REPORT   Patient Name:   Kathleen Wallace Date of Exam: 01/18/2020 Medical Rec #:  [id number]    Height:       61.0 in Accession #:    [id number]   Weight:       120.0 lb Date of Birth:  12/29/1933    BSA:          1.520 m Patient Age:    86 years     BP:           126/60 mmHg Patient Gender: F            HR:           80 bpm. Exam Location:  [facility] Procedure: 2D Echo, Color  Doppler, Cardiac Doppler and Strain Analysis Indications:     I48.91 Atrial fibrillation  History:         Patient has no prior history of Echocardiogram examinations.  Sonographer:     Humphrey Rolls RDCS (AE) Referring Phys:  [id number] Vernetta Honey MANSY Diagnosing Phys: Harold Hedge MD  Sonographer Comments: Suboptimal parasternal window. Image acquisition challenging due to breast implants. Global longitudinal strain was attempted. IMPRESSIONS  1. Left ventricular ejection fraction, by estimation, is 55 to 60%. The left ventricle has normal function. The left ventricle has no regional wall motion abnormalities. There is moderate left ventricular hypertrophy. Left ventricular diastolic parameters are consistent with Grade I diastolic dysfunction (impaired relaxation).  2. Right ventricular systolic function is normal. The right ventricular size is normal.  3. The mitral valve is grossly normal. Trivial mitral valve regurgitation.  4. The aortic valve is grossly normal. Aortic valve regurgitation is trivial. FINDINGS  Left Ventricle: Left ventricular ejection fraction, by estimation, is 55 to 60%. The left ventricle has normal function. The left ventricle has no regional wall motion abnormalities. The left ventricular internal cavity size was normal in size. There is  moderate left ventricular hypertrophy. Left ventricular diastolic parameters are consistent with Grade I diastolic dysfunction (impaired relaxation). Right Ventricle: The right ventricular size is normal. No increase in right ventricular wall thickness. Right ventricular systolic function is normal. Left Atrium: Left atrial size was normal in size. Right Atrium: Right atrial size was normal in size. Pericardium: There is no evidence of pericardial effusion. Mitral Valve: The mitral valve is grossly normal. Trivial mitral valve regurgitation. MV peak gradient, 6.2 mmHg. The mean mitral valve gradient is 3.0 mmHg. Tricuspid Valve: The tricuspid valve is grossly  normal. Tricuspid valve regurgitation is trivial. Aortic Valve: The aortic valve is grossly normal. Aortic valve regurgitation is trivial. Aortic valve mean gradient measures 9.0 mmHg. Aortic valve peak gradient measures 14.7 mmHg. Aortic valve area, by VTI measures 1.18 cm. Pulmonic Valve: The pulmonic valve was not assessed. Pulmonic valve regurgitation is not visualized. Aorta: The aortic root is normal in size and structure. IAS/Shunts: The interatrial septum was not assessed.  LEFT VENTRICLE PLAX 2D LVIDd:  4.07 cm  Diastology LVIDs:         2.85 cm  LV e' medial:    5.33 cm/s LV PW:         1.05 cm  LV E/e' medial:  17.3 LV IVS:        0.88 cm  LV e' lateral:   6.53 cm/s LVOT diam:     1.90 cm  LV E/e' lateral: 14.1 LV SV:         46 LV SV Index:   31 LVOT Area:     2.84 cm  RIGHT VENTRICLE RV Basal diam:  2.52 cm LEFT ATRIUM             Index       RIGHT ATRIUM           Index LA Vol (A2C):   24.7 ml 16.25 ml/m RA Area:     10.90 cm LA Vol (A4C):   25.8 ml 16.97 ml/m RA Volume:   26.80 ml  17.63 ml/m LA Biplane Vol: 27.1 ml 17.83 ml/m  AORTIC VALVE                    PULMONIC VALVE AV Area (Vmax):    1.31 cm     PV Vmax:       0.85 m/s AV Area (Vmean):   1.32 cm     PV Vmean:      60.100 cm/s AV Area (VTI):     1.18 cm     PV VTI:        0.150 m AV Vmax:           192.00 cm/s  PV Peak grad:  2.9 mmHg AV Vmean:          137.000 cm/s PV Mean grad:  2.0 mmHg AV VTI:            0.395 m AV Peak Grad:      14.7 mmHg AV Mean Grad:      9.0 mmHg LVOT Vmax:         88.80 cm/s LVOT Vmean:        63.800 cm/s LVOT VTI:          0.164 m LVOT/AV VTI ratio: 0.42  AORTA Ao Root diam: 2.60 cm MITRAL VALVE MV Area (PHT): 4.19 cm     SHUNTS MV Peak grad:  6.2 mmHg     Systemic VTI:  0.16 m MV Mean grad:  3.0 mmHg     Systemic Diam: 1.90 cm MV Vmax:       1.25 m/s MV Vmean:      77.4 cm/s MV Decel Time: 181 msec MV E velocity: 92.10 cm/s MV A velocity: 131.00 cm/s MV E/A ratio:  0.70 Bartholome Bill MD  Electronically signed by Bartholome Bill MD Signature Date/Time: 01/18/2020/4:55:43 PM    Final      Echo echocardiogram preserved overall left ventricular function EF 55 to 60%  TELEMETRY: Atrial fibrillation left bundle branch block rate of around 90:  ASSESSMENT AND PLAN:  Active Problems:   CAP (community acquired pneumonia) Shortness of breath Transient episode of delirium Atrial fibrillation Hypertension COPD . Plan Agree with steroid therapy for inflammation Continue antibiotic therapy for pneumonia Would recommend anticoagulation with Eliquis for atrial fibrillation Blood pressure and modest rate control for hypertension Recommend ACE ARB or Entresto for mild cardiomyopathy Abnormal EKG left bundle branch block probably related to cardiomyopathy Inhalers as necessary for COPD Increase activity  ambulate in the halls Reasonably safe for discharge home with follow-up Have the patient follow-up with cardiology 1 to 2 weeks for atrial fibrillation   Yolonda Kida, MD 01/20/2020 9:52 AM

## 2020-01-20 NOTE — Discharge Summary (Addendum)
Physician Discharge Summary  Kathleen Wallace [id number] DOB: [birth year] DOA: 01/17/2020  PCP: Einar Pheasant, MD  Admit date: 01/17/2020 Discharge date: 01/20/2020  Admitted From: HOME Disposition:  home  Recommendations for Outpatient Follow-up:  1. Follow up with PCP in 1-2 weeks 2. Please obtain BMP/CBC in one week 3. Please follow up on the following pending results:  Home Health:no  Equipment/Devices: none  Discharge Condition: Stable Code Status:   Code Status: Full Code Diet recommendation:  Diet Order            Diet - low sodium heart healthy           Diet Heart Room service appropriate? Yes; Fluid consistency: Thin  Diet effective now                  Brief/Interim Summary: 86 year old female with HTN, GERD, reactive airway disease presented to the ED with acute onset of generalized weakness for last several days, was seen at the urgent care and was having expressive dysphasia and EMS was called due to possible stroke As per the report the patient reportedly was last seen at her baseline at 6 AM this morning per her husband. She was noted to have a temperature of 100.5 and tachycardia 105 at urgent care. When she was sent to the ER she was having chest discomfort as well as cough and dyspnea with malaise. She admitted to having worsening dyspnea and cough with expectoration of yellowish sputum over the last month as well as associated wheezes. She has been diagnosed with asthmatic bronchitis recently. She has been having altered mental status with confusion that is currently significantly better. She denies any fever or chills at home until today. No nausea or vomiting or abdominal pain. No dysuria, oliguria or hematuria or flank pain. She has been vaccinated against COVID-19 and denied any recent exposure.  Upon presentation to the ER, temperature 100.5 with a blood pressure 133/63 and heart rate of 105 respiratory to 24 with pulse 70 of 89% on room air.  Later on temperature was 102 and blood pressure dropped to 80/49 with a MAP of 56. After hydration blood pressure has improved to 129/75 and has remained stable. Pulse symmetry was up to 95% on 3 L of O2 and has been ranging from 92 to 95%. Labs revealed mild cytosis of 11.7 with a neutrophilia and anemia. Procalcitonin was 3.55 and lactic acid 1.3. CMP was unremarkable. Influenza antigens and COVID-19 PCR came back negative. Urinalysis was remarkable for 30 protein. Blood cultures were drawn. Portable chest ray showed right greater than left patchy airspace opacities suspicious for bilateral pneumonia. EKG showed sinus rhythm with a rate of 96 with premature atrial complexes and left bundle blanch block. Repeat EKG revealed atrial fibrillation with controlled ventricular response 99 with left bundle blanch block  The patient was given 1 g of p.o. Tylenol, IV Rocephin and Zithromax and 1.75 L of IV lactated Ringer.  Patient is admitted for community-acquired pneumonia. Patient was also treated for associated bronchospasm with a steroid.  She was found to have new onset A. fib seen by cardiology placed on low-dose Eliquis for now with plan to have outpatient cardiology follow-up in 7 to 10 days for Holter monitor to determine need for long-term anticoagulation. Initially on oxygen with has been weaned off since then.  She has been ambulatory walking in the hallway without oxygen no shortness of breath chest pain fever chills.  He has clinically improved.  BNP is somewhat  elevated and given a dose of Lasix while here. She is requesting to go home today. She is clinically stable although WBC count uptrending but she is on IV steroids and advised to check CBC 5 to 7 days from her primary care doctor seek medical care if she has any fever or any worsening shortness of breath or any other concerning symptoms.  Discharge Diagnoses:  CAP bilateral with severe sepsis present on admission , patient  presented with fever/tachycardia tachypnea, and x-ray finding of pneumonia.  Procalcitonin still up 4-5, lactic acidosis improved.  WBC uptrending but afebrile could be from steroid effect vs worsening pneumonia, chest x-ray shows progressive multifocal pulmonary infiltrates more focal in the right upper lobe.    She is clinically improved although WBC count elevated likely from steroid.  Procalcitonin on repeated downtrending.  She will complete total 5 days of azithromycin and 7 days of cephalosporin.  She is requesting to go home.  She is not on oxygen.  Culture data has been unremarkable.  Repeat chest x-ray in 4 weeks from primary doctor along with CBC in 1 week.    Acute hypoxic respiratory failure due to #1 it has resolved.  BNP was somewhat elevated and given small dose of Lasix with good output. Echocardiogram 12/29 with EF 55 to 60%, no regional wall motion abnormalities, moderate LVH grade 1 DD, mitral and aortic valve grossly normal . New onset PAF noted in EKG. TSH normal,, TTE with preserved EF, cardiology consulted -placed on Eliquis low-dose for now 2/2  High CHADS2VASCof  5 and need outpatient follow-up with cardiology for long-term anticoagulation and Holter monitor History of CHF EF previously 40 to 45%, echo shows EF 55 to 60 % this admission.bnp is up and given Lasix 20 mg x 1 with good output. Acute bronchospasm: resolved w/ steroid. complete few more days of steroid. GERD:continue Pepcid ZOX:[id number]:Initially hypotensive in the ED. not on medication. Glaucoma:Continue ophthalmic drops  Consults:  cardiology  Subjective: Aaox3, on RA, some cough, ambulatory and on RA.   Discharge Exam: Vitals:   01/20/20 0815 01/20/20 1157  BP: (!) 147/70 (!) 151/73  Pulse: 70 74  Resp: 17 17  Temp: 98.1 F (36.7 C) 97.8 F (36.6 C)  SpO2: 98% 93%   General: Pt is alert, awake, not in acute distress Cardiovascular: RRR, S1/S2 +, no rubs, no gallops Respiratory: CTA bilaterally, no  wheezing, no rhonchi Abdominal: Soft, NT, ND, bowel sounds + Extremities: no edema, no cyanosis  Discharge Instructions  Discharge Instructions    Diet - low sodium heart healthy   Complete by: As directed    Discharge instructions   Complete by: As directed    Please follow-up with family doctor ,  you will need CBC in 1 week, chest x-ray 3 to 4 weeks. Follow-up with your cardiologist for Holter monitor in 7 to 10 days  Please call call MD or return to ER for similar or worsening recurring problem that brought you to hospital or if any fever,nausea/vomiting,abdominal pain, uncontrolled pain, chest pain,  shortness of breath or any other alarming symptoms.  Please follow-up your doctor as instructed in a week time and call the office for appointment.  Please avoid alcohol, smoking, or any other illicit substance and maintain healthy habits including taking your regular medications as prescribed.  You were cared for by a hospitalist during your hospital stay. If you have any questions about your discharge medications or the care you received while you were in the hospital  after you are discharged, you can call the unit and ask to speak with the hospitalist on call if the hospitalist that took care of you is not available.  Once you are discharged, your primary care physician will handle any further medical issues. Please note that NO REFILLS for any discharge medications will be authorized once you are discharged, as it is imperative that you return to your primary care physician (or establish a relationship with a primary care physician if you do not have one) for your aftercare needs so that they can reassess your need for medications and monitor your lab values   Increase activity slowly   Complete by: As directed      Allergies as of 01/20/2020      Reactions   Other Anaphylaxis   Codeine Nausea Only, Other (See Comments)   tachycardia   Amoxicillin    insomnia Did it involve  swelling of the face/tongue/throat, SOB, or low BP? No Did it involve sudden or severe rash/hives, skin peeling, or any reaction on the inside of your mouth or nose? No Did you need to seek medical attention at a hospital or doctor's office? No When did it last happen?years ago If all above answers are "NO", may proceed with cephalosporin use.      Medication List    STOP taking these medications   doxycycline 100 MG capsule Commonly known as: VIBRAMYCIN     TAKE these medications   albuterol 108 (90 Base) MCG/ACT inhaler Commonly known as: VENTOLIN HFA Inhale 1-2 puffs into the lungs every 6 (six) hours as needed for wheezing or shortness of breath.   apixaban 2.5 MG Tabs tablet Commonly known as: ELIQUIS Take 1 tablet (2.5 mg total) by mouth 2 (two) times daily.   azithromycin 500 MG tablet Commonly known as: ZITHROMAX Take 1 tablet (500 mg total) by mouth daily for 2 days.   Breo Ellipta 100-25 MCG/INH Aepb Generic drug: fluticasone furoate-vilanterol SMARTSIG:1 Inhalation Via Inhaler Daily What changed: Another medication with the same name was removed. Continue taking this medication, and follow the directions you see here.   cefdinir 300 MG capsule Commonly known as: OMNICEF Take 1 capsule (300 mg total) by mouth 2 (two) times daily for 5 days.   CoQ10 100 MG Caps Take 100 mg by mouth daily.   dorzolamidel-timolol 22.3-6.8 MG/ML Soln ophthalmic solution Commonly known as: COSOPT Place 1 drop into both eyes 2 (two) times daily.   Flovent HFA 110 MCG/ACT inhaler Generic drug: fluticasone   latanoprost 0.005 % ophthalmic solution Commonly known as: XALATAN Place 1 drop into both eyes at bedtime.   multivitamin with minerals tablet Take 1 tablet by mouth daily.   predniSONE 10 MG tablet Commonly known as: DELTASONE Take 1 tablet (10 mg total) by mouth daily with breakfast for 4 days. Start taking on: January 21, 2020 What changed:   medication  strength  how much to take  how to take this  when to take this  additional instructions   Rocklatan 0.02-0.005 % Soln Generic drug: Netarsudil-Latanoprost SMARTSIG:1 Drop(s) In Eye(s) Every Evening   vitamin C 500 MG tablet Commonly known as: ASCORBIC ACID Take 500 mg by mouth daily.       Follow-up Information    Einar Pheasant, MD Follow up in 1 week(s).   Specialty: Internal Medicine Why: CBC in 1 week, chest x-ray 3 to 4 weeks Contact information: [id number] [place] [address] [phone]  Yolonda Kida, MD Follow up.   Specialties: Cardiology, Internal Medicine Why: 7-10 days Contact information: [address] [place] [address] [phone]              Allergies  Allergen Reactions  . Other Anaphylaxis  . Codeine Nausea Only and Other (See Comments)    tachycardia  . Amoxicillin     insomnia Did it involve swelling of the face/tongue/throat, SOB, or low BP? No Did it involve sudden or severe rash/hives, skin peeling, or any reaction on the inside of your mouth or nose? No Did you need to seek medical attention at a hospital or doctor's office? No When did it last happen?years ago If all above answers are "NO", may proceed with cephalosporin use.     The results of significant diagnostics from this hospitalization (including imaging, microbiology, ancillary and laboratory) are listed below for reference.    Microbiology: Recent Results (from the past 240 hour(s))  Urine culture     Status: None   Collection Time: 01/17/20  6:27 PM   Specimen: In/Out Cath Urine  Result Value Ref Range Status   Specimen Description   Final    IN/OUT CATH URINE Performed at [facility], [address], [place], [place] [address]    Special Requests   Final    NONE Performed at [facility], [address] [place], [place] [address]    Culture   Final    NO GROWTH Performed  at [facility], [place] [address], [place], [place] [address]    Report Status 01/19/2020 FINAL  Final  Blood Culture (routine x 2)     Status: None (Preliminary result)   Collection Time: 01/17/20  6:51 PM   Specimen: BLOOD  Result Value Ref Range Status   Specimen Description BLOOD RIGHT ANTECUBITAL  Final   Special Requests   Final    BOTTLES DRAWN AEROBIC AND ANAEROBIC Blood Culture adequate volume   Culture   Final    NO GROWTH 3 DAYS Performed at [facility], [address] [place], [place] [address]    Report Status PENDING  Incomplete  Blood Culture (routine x 2)     Status: None (Preliminary result)   Collection Time: 01/17/20  6:51 PM   Specimen: BLOOD  Result Value Ref Range Status   Specimen Description BLOOD BLOOD LEFT FOREARM  Final   Special Requests   Final    BOTTLES DRAWN AEROBIC AND ANAEROBIC Blood Culture results may not be optimal due to an inadequate volume of blood received in culture bottles   Culture   Final    NO GROWTH 3 DAYS Performed at [facility], [address], [place], [place] [address]    Report Status PENDING  Incomplete  Resp Panel by RT-PCR (Flu A&B, Covid) Nasopharyngeal Swab     Status: None   Collection Time: 01/17/20  6:51 PM   Specimen: Nasopharyngeal Swab; Nasopharyngeal(NP) swabs in vial transport medium  Result Value Ref Range Status   SARS Coronavirus 2 by RT PCR NEGATIVE NEGATIVE Final    Comment: (NOTE) SARS-CoV-2 target nucleic acids are NOT DETECTED.  The SARS-CoV-2 RNA is generally detectable in upper respiratory specimens during the acute phase of infection. The lowest concentration of SARS-CoV-2 viral copies this assay can detect is 138 copies/mL. A negative result does not preclude SARS-Cov-2 infection and should not be used as the sole basis for treatment or other patient management decisions. A negative  result may occur with  improper specimen collection/handling, submission of specimen  other than nasopharyngeal swab, presence of viral mutation(s) within the areas targeted by this assay, and inadequate number of viral copies(<138 copies/mL). A negative result must be combined with clinical observations, patient history, and epidemiological information. The expected result is Negative.  Fact Sheet for Patients:  EntrepreneurPulse.com.au  Fact Sheet for Healthcare Providers:  IncredibleEmployment.be  This test is no t yet approved or cleared by the [place] FDA and  has been authorized for detection and/or diagnosis of SARS-CoV-2 by FDA under an Emergency Use Authorization (EUA). This EUA will remain  in effect (meaning this test can be used) for the duration of the COVID-19 declaration under Section 564(b)(1) of the Act, 21 U.S.C.section 360bbb-3(b)(1), unless the authorization is terminated  or revoked sooner.       Influenza A by PCR NEGATIVE NEGATIVE Final   Influenza B by PCR NEGATIVE NEGATIVE Final    Comment: (NOTE) The Xpert Xpress SARS-CoV-2/FLU/RSV plus assay is intended as an aid in the diagnosis of influenza from Nasopharyngeal swab specimens and should not be used as a sole basis for treatment. Nasal washings and aspirates are unacceptable for Xpert Xpress SARS-CoV-2/FLU/RSV testing.  Fact Sheet for Patients: EntrepreneurPulse.com.au  Fact Sheet for Healthcare Providers: IncredibleEmployment.be  This test is not yet approved or cleared by the [place] FDA and has been authorized for detection and/or diagnosis of SARS-CoV-2 by FDA under an Emergency Use Authorization (EUA). This EUA will remain in effect (meaning this test can be used) for the duration of the COVID-19 declaration under Section 564(b)(1) of the Act, 21 U.S.C. section 360bbb-3(b)(1), unless the authorization is terminated or revoked.  Performed at [facility], [address]  [place], [place] [address]     Procedures/Studies: [facility] Chest Port 1 View  Result Date: 01/19/2020 CLINICAL DATA:  Dyspnea EXAM: PORTABLE CHEST 1 VIEW COMPARISON:  01/17/2020 FINDINGS: Lung volumes are small, but are symmetric and pulmonary insufflation remain stable. Superimposed multifocal pulmonary infiltrate is again identified and has progressed, particularly within the right upper lobe. No pneumothorax or pleural effusion. Cardiac size within normal limits. Bilateral breast implants noted. No acute bone abnormality. IMPRESSION: Progressive multifocal pulmonary infiltrates, more focal within the right upper lobe, in keeping with changes of atypical infection in the acute setting. Electronically Signed   By: Fidela Salisbury MD   On: 01/19/2020 05:58   DG Chest Port 1 View  Result Date: 01/17/2020 CLINICAL DATA:  Cough short of breath EXAM: PORTABLE CHEST 1 VIEW COMPARISON:  12/19/2019 FINDINGS: Patchy right greater than left airspace opacities. Stable cardiomediastinal silhouette. No pneumothorax. Calcified breast implants. IMPRESSION: Patchy right greater than left airspace opacities suspicious for bilateral. Electronically Signed   By: Donavan Foil M.D.   On: 01/17/2020 19:04   ECHOCARDIOGRAM COMPLETE  Result Date: 01/18/2020    ECHOCARDIOGRAM REPORT   Patient Name:   LOREA BRESTER Date of Exam: 01/18/2020 Medical Rec #:  [id number]    Height:       61.0 in Accession #:    [id number]   Weight:       120.0 lb Date of Birth:  [birth year]    BSA:          1.520 m Patient Age:    56 years     BP:           126/60 mmHg Patient Gender: F            HR:  80 bpm. Exam Location:  [facility] Procedure: 2D Echo, Color Doppler, Cardiac Doppler and Strain Analysis Indications:     I48.91 Atrial fibrillation  History:         Patient has no prior history of Echocardiogram examinations.  Sonographer:     Humphrey Rolls RDCS (AE) Referring Phys:  [id number] Vernetta Honey MANSY Diagnosing Phys: Harold Hedge MD  Sonographer  Comments: Suboptimal parasternal window. Image acquisition challenging due to breast implants. Global longitudinal strain was attempted. IMPRESSIONS  1. Left ventricular ejection fraction, by estimation, is 55 to 60%. The left ventricle has normal function. The left ventricle has no regional wall motion abnormalities. There is moderate left ventricular hypertrophy. Left ventricular diastolic parameters are consistent with Grade I diastolic dysfunction (impaired relaxation).  2. Right ventricular systolic function is normal. The right ventricular size is normal.  3. The mitral valve is grossly normal. Trivial mitral valve regurgitation.  4. The aortic valve is grossly normal. Aortic valve regurgitation is trivial. FINDINGS  Left Ventricle: Left ventricular ejection fraction, by estimation, is 55 to 60%. The left ventricle has normal function. The left ventricle has no regional wall motion abnormalities. The left ventricular internal cavity size was normal in size. There is  moderate left ventricular hypertrophy. Left ventricular diastolic parameters are consistent with Grade I diastolic dysfunction (impaired relaxation). Right Ventricle: The right ventricular size is normal. No increase in right ventricular wall thickness. Right ventricular systolic function is normal. Left Atrium: Left atrial size was normal in size. Right Atrium: Right atrial size was normal in size. Pericardium: There is no evidence of pericardial effusion. Mitral Valve: The mitral valve is grossly normal. Trivial mitral valve regurgitation. MV peak gradient, 6.2 mmHg. The mean mitral valve gradient is 3.0 mmHg. Tricuspid Valve: The tricuspid valve is grossly normal. Tricuspid valve regurgitation is trivial. Aortic Valve: The aortic valve is grossly normal. Aortic valve regurgitation is trivial. Aortic valve mean gradient measures 9.0 mmHg. Aortic valve peak gradient measures 14.7 mmHg. Aortic valve area, by VTI measures 1.18 cm. Pulmonic Valve:  The pulmonic valve was not assessed. Pulmonic valve regurgitation is not visualized. Aorta: The aortic root is normal in size and structure. IAS/Shunts: The interatrial septum was not assessed.  LEFT VENTRICLE PLAX 2D LVIDd:         4.07 cm  Diastology LVIDs:         2.85 cm  LV e' medial:    5.33 cm/s LV PW:         1.05 cm  LV E/e' medial:  17.3 LV IVS:        0.88 cm  LV e' lateral:   6.53 cm/s LVOT diam:     1.90 cm  LV E/e' lateral: 14.1 LV SV:         46 LV SV Index:   31 LVOT Area:     2.84 cm  RIGHT VENTRICLE RV Basal diam:  2.52 cm LEFT ATRIUM             Index       RIGHT ATRIUM           Index LA Vol (A2C):   24.7 ml 16.25 ml/m RA Area:     10.90 cm LA Vol (A4C):   25.8 ml 16.97 ml/m RA Volume:   26.80 ml  17.63 ml/m LA Biplane Vol: 27.1 ml 17.83 ml/m  AORTIC VALVE  PULMONIC VALVE AV Area (Vmax):    1.31 cm     PV Vmax:       0.85 m/s AV Area (Vmean):   1.32 cm     PV Vmean:      60.100 cm/s AV Area (VTI):     1.18 cm     PV VTI:        0.150 m AV Vmax:           192.00 cm/s  PV Peak grad:  2.9 mmHg AV Vmean:          137.000 cm/s PV Mean grad:  2.0 mmHg AV VTI:            0.395 m AV Peak Grad:      14.7 mmHg AV Mean Grad:      9.0 mmHg LVOT Vmax:         88.80 cm/s LVOT Vmean:        63.800 cm/s LVOT VTI:          0.164 m LVOT/AV VTI ratio: 0.42  AORTA Ao Root diam: 2.60 cm MITRAL VALVE MV Area (PHT): 4.19 cm     SHUNTS MV Peak grad:  6.2 mmHg     Systemic VTI:  0.16 m MV Mean grad:  3.0 mmHg     Systemic Diam: 1.90 cm MV Vmax:       1.25 m/s MV Vmean:      77.4 cm/s MV Decel Time: 181 msec MV E velocity: 92.10 cm/s MV A velocity: 131.00 cm/s MV E/A ratio:  0.70 Bartholome Bill MD Electronically signed by Bartholome Bill MD Signature Date/Time: 01/18/2020/4:55:43 PM    Final     Labs: BNP (last 3 results) Recent Labs    12/19/19 1115 01/18/20 1340 01/20/20 0509  BNP 190.3* 851.8* [id number]*   Basic Metabolic Panel: Recent Labs  Lab 01/17/20 1851 01/18/20 0521  01/19/20 0421 01/20/20 0509  NA 135 135 138 138  K 4.0 3.8 4.5 4.2  CL 99 103 107 104  CO2 24 24 25 22   GLUCOSE 126* 128* 158* 132*  BUN 22 14 17 17   CREATININE 0.75 0.56 0.61 0.73  CALCIUM 8.3* 8.1* 8.5* 8.3*  MG 1.8  --   --   --    Liver Function Tests: Recent Labs  Lab 01/17/20 1851  AST 21  ALT 15  ALKPHOS 40  BILITOT 1.0  PROT 6.5  ALBUMIN 3.9   No results for input(s): LIPASE, AMYLASE in the last 168 hours. No results for input(s): AMMONIA in the last 168 hours. CBC: Recent Labs  Lab 01/17/20 1851 01/18/20 0521 01/19/20 0421 01/20/20 0509  WBC 11.7* 10.7* 15.6* 17.7*  NEUTROABS 10.0*  --   --   --   HGB 10.7* 11.0* 10.5* 11.5*  HCT 32.0* 32.0* 30.0* 32.8*  MCV 93.3 91.7 90.1 91.4  PLT 201 186 210 309   Cardiac Enzymes: No results for input(s): CKTOTAL, CKMB, CKMBINDEX, TROPONINI in the last 168 hours. BNP: Invalid input(s): POCBNP CBG: Recent Labs  Lab 01/17/20 1725  GLUCAP 122*   D-Dimer No results for input(s): DDIMER in the last 72 hours. Hgb A1c No results for input(s): HGBA1C in the last 72 hours. Lipid Profile No results for input(s): CHOL, HDL, LDLCALC, TRIG, CHOLHDL, LDLDIRECT in the last 72 hours. Thyroid function studies Recent Labs    01/18/20 0521  TSH 0.632   Anemia work up No results for input(s): VITAMINB12, FOLATE, FERRITIN, TIBC, IRON, RETICCTPCT in the  last 72 hours. Urinalysis    Component Value Date/Time   COLORURINE AMBER (A) 01/17/2020 1827   APPEARANCEUR HAZY (A) 01/17/2020 1827   LABSPEC 1.026 01/17/2020 1827   PHURINE 5.0 01/17/2020 1827   GLUCOSEU NEGATIVE 01/17/2020 1827   GLUCOSEU NEGATIVE 04/18/2014 1324   HGBUR NEGATIVE 01/17/2020 1827   BILIRUBINUR NEGATIVE 01/17/2020 1827   KETONESUR 20 (A) 01/17/2020 1827   PROTEINUR 30 (A) 01/17/2020 1827   UROBILINOGEN 0.2 04/18/2014 1324   NITRITE NEGATIVE 01/17/2020 1827   LEUKOCYTESUR NEGATIVE 01/17/2020 1827   Sepsis Labs Invalid input(s): PROCALCITONIN,   WBC,  LACTICIDVEN Microbiology Recent Results (from the past 240 hour(s))  Urine culture     Status: None   Collection Time: 01/17/20  6:27 PM   Specimen: In/Out Cath Urine  Result Value Ref Range Status   Specimen Description   Final    IN/OUT CATH URINE Performed at [facility], [address], [place], [place] [address]    Special Requests   Final    NONE Performed at [facility], [address] [place], [place] [address]    Culture   Final    NO GROWTH Performed at [facility], [place] [address], [place], [place] [address]    Report Status 01/19/2020 FINAL  Final  Blood Culture (routine x 2)     Status: None (Preliminary result)   Collection Time: 01/17/20  6:51 PM   Specimen: BLOOD  Result Value Ref Range Status   Specimen Description BLOOD RIGHT ANTECUBITAL  Final   Special Requests   Final    BOTTLES DRAWN AEROBIC AND ANAEROBIC Blood Culture adequate volume   Culture   Final    NO GROWTH 3 DAYS Performed at [facility], [address], [place], [place] [id number]    Report Status PENDING  Incomplete  Blood Culture (routine x 2)     Status: None (Preliminary result)   Collection Time: 01/17/20  6:51 PM   Specimen: BLOOD  Result Value Ref Range Status   Specimen Description BLOOD BLOOD LEFT FOREARM  Final   Special Requests   Final    BOTTLES DRAWN AEROBIC AND ANAEROBIC Blood Culture results may not be optimal due to an inadequate volume of blood received in culture bottles   Culture   Final    NO GROWTH 3 DAYS Performed at [facility], [address], [place], [place] [address]    Report Status PENDING  Incomplete  Resp Panel by RT-PCR (Flu A&B, Covid) Nasopharyngeal Swab     Status: None   Collection Time: 01/17/20  6:51 PM   Specimen: Nasopharyngeal Swab; Nasopharyngeal(NP) swabs in vial transport medium  Result Value Ref Range Status   SARS Coronavirus 2 by RT PCR NEGATIVE NEGATIVE Final    Comment:  (NOTE) SARS-CoV-2 target nucleic acids are NOT DETECTED.  The SARS-CoV-2 RNA is generally detectable in upper respiratory specimens during the acute phase of infection. The lowest concentration of SARS-CoV-2 viral copies this assay can detect is 138 copies/mL. A negative result does not preclude SARS-Cov-2 infection and should not be used as the sole basis for treatment or other patient management decisions. A negative result may occur with  improper specimen collection/handling, submission of specimen other than nasopharyngeal swab, presence of viral mutation(s) within the areas targeted by this assay, and inadequate number of viral copies(<138 copies/mL). A negative result must be combined with clinical observations, patient history, and epidemiological information. The expected result is Negative.  Fact Sheet for Patients:  EntrepreneurPulse.com.au  Fact Sheet for Healthcare Providers:  IncredibleEmployment.be  This test is no t yet approved or cleared by the [place] FDA and  has been authorized for detection and/or diagnosis of SARS-CoV-2 by FDA under an Emergency Use Authorization (EUA). This EUA will remain  in effect (meaning this test can be used) for the duration of the COVID-19 declaration under Section 564(b)(1) of the Act, 21 U.S.C.section 360bbb-3(b)(1), unless the authorization is terminated  or revoked sooner.       Influenza A by PCR NEGATIVE NEGATIVE Final   Influenza B by PCR NEGATIVE NEGATIVE Final    Comment: (NOTE) The Xpert Xpress SARS-CoV-2/FLU/RSV plus assay is intended as an aid in the diagnosis of influenza from Nasopharyngeal swab specimens and should not be used as a sole basis for treatment. Nasal washings and aspirates are unacceptable for Xpert Xpress SARS-CoV-2/FLU/RSV testing.  Fact Sheet for Patients: EntrepreneurPulse.com.au  Fact Sheet for Healthcare  Providers: IncredibleEmployment.be  This test is not yet approved or cleared by the [place] FDA and has been authorized for detection and/or diagnosis of SARS-CoV-2 by FDA under an Emergency Use Authorization (EUA). This EUA will remain in effect (meaning this test can be used) for the duration of the COVID-19 declaration under Section 564(b)(1) of the Act, 21 U.S.C. section 360bbb-3(b)(1), unless the authorization is terminated or revoked.  Performed at [facility], [address] [place], [place] [address]      Time coordinating discharge: 25 minutes  SIGNED: Antonieta Pert, MD  [facility] 01/20/2020, 1:33 PM  If 7PM-7AM, please contact night-coverage [email or web address]

## 2020-01-20 NOTE — Care Management Important Message (Signed)
Important Message  Patient Details  Name: Kathleen Wallace MRN: [id number] Date of Birth: 1933-03-03   Medicare Important Message Given:  N/A - LOS <3 / Initial given by admissions     Olegario Messier A Naydelin Ziegler 01/20/2020, 8:04 AM

## 2020-01-22 LAB — CULTURE, BLOOD (ROUTINE X 2)
Culture: NO GROWTH
Culture: NO GROWTH
Special Requests: ADEQUATE

## 2020-01-23 ENCOUNTER — Telehealth: Payer: Self-pay

## 2020-01-23 NOTE — Telephone Encounter (Signed)
Transition Care Management Unsuccessful Follow-up Telephone Call  Date of discharge and from where:  01/20/20 from [facility]  Attempts:  1st Attempt  Reason for unsuccessful TCM follow-up call:  Unable to reach patient    

## 2020-01-24 NOTE — Telephone Encounter (Signed)
Transition Care Management Unsuccessful Follow-up Telephone Call  Date of discharge and from where:  01/20/20 from [facility]  Attempts:  2nd Attempt  Reason for unsuccessful TCM follow-up call:  Left voice message

## 2020-01-25 NOTE — Telephone Encounter (Signed)
Transition Care Management Unsuccessful Follow-up Telephone Call  Date of discharge and from where:  01/20/20 from [facility]  Attempts:  3rd Attempt  Reason for unsuccessful TCM follow-up call:  Left voice message. Schedule hospital follow up with pcp.

## 2020-01-25 NOTE — Telephone Encounter (Signed)
Patient returned office phone call. 

## 2020-01-26 ENCOUNTER — Other Ambulatory Visit: Payer: Self-pay | Admitting: *Deleted

## 2020-01-26 NOTE — Telephone Encounter (Signed)
Pt returned your call.  

## 2020-01-26 NOTE — Patient Outreach (Signed)
[facility]) Care Management  01/26/2020  Kathleen Wallace [birth year] [id number]   EMMI-GENERAL DISCHARGE RED ON EMMI ALERT Day #4 Date:01/25/2020 Red Alert Reason: NO FOLLOW UP APPOINTMENT  OUTREACH #1 RN spoke with pt concerning the above EMMI. Pt states she is currently resolving this matter awaiting for her provider's office to call her back to scheduled. Pt has already contacted her CAD and informed no follow up appointment is necessary.  No other need at this time. Will close this emmi.  Elliot Cousin, RN Care Management Coordinator [facility] Main Office [phone]

## 2020-01-27 ENCOUNTER — Ambulatory Visit: Payer: Medicare HMO | Admitting: Internal Medicine

## 2020-01-27 ENCOUNTER — Telehealth: Payer: Self-pay

## 2020-01-27 NOTE — Telephone Encounter (Signed)
See most recent note

## 2020-01-27 NOTE — Telephone Encounter (Signed)
Returned patient call. No answer. Please schedule for hospital follow up if patient calls back. Patient does not need to speak to Nurse Health Advisor if scheduling hospital follow up.

## 2020-02-01 ENCOUNTER — Telehealth: Payer: Self-pay | Admitting: Internal Medicine

## 2020-02-01 NOTE — Telephone Encounter (Signed)
Patient called in stated that [facility] wanted her to get a follow up x-ray of her chest she was in hospital for pneumonia she need authorization to have it done

## 2020-02-01 NOTE — Telephone Encounter (Signed)
Per review of chart, pt admitted 01/17/20 - 01/20/20 - pneumonia.  Denisa apparently tried to do TCM. Unable to reach.  Need to call and see how she is doing.  Needs hospital follow up.  CXR would be 4-6 weeks, but I will schedule f/u cxr when due.

## 2020-02-01 NOTE — Telephone Encounter (Signed)
Pt discharged from [facility] on 12/31. She had pneumonia. Wants to know when she needs to do a f/u cxr.

## 2020-02-01 NOTE — Telephone Encounter (Signed)
Transition Care Management Follow-up Telephone Call  Date of discharge and from where: 01/20/20 from [facility].  How have you been since you were released from the hospital?  Patient states, she is doing real good with absolutely no complaints or symptoms. Notes she feels like she is back to baseline and currently washing dishes and she has been keeping busy. Denies cough, fever, dizziness, shortness of breath, n/v/d, pain and all other symptoms.   Any questions or concerns? No  Items Reviewed:  Did the pt receive and understand the discharge instructions provided? Yes , increase activity as tolerated.   Medications obtained and verified? Yes , stopped doxycycline. Discharge medications course complete except still taking eliquis 2.5mg  as directed.   Other? No   Any new allergies since your discharge? No   Dietary orders reviewed? Low sodium/heart healthy  Do you have support at home? Yes   Home Care and Equipment/Supplies: Were home health services ordered? No  What is the name of the medical supply agency? No  Functional Questionnaire: (I = Independent and D = Dependent) ADLs: i  Bathing/Dressing- i  Meal Prep- i  Eating- i  Maintaining continence- i  Transferring/Ambulation- i  Managing Meds- i  Follow up appointments reviewed:   PCP Hospital f/u appt confirmed? Yes , scheduled to see Dr.Scott on 02/13/20 @ 11:00. Patient okay to have chest xray/labs complete at [facility] if pcp prefer.   [facility] f/u appt confirmed? Plans to call Cardiology herself next week to follow up with Holter monitor. Nurse offered to call, patient declined.  Are transportation arrangements needed? No   If their condition worsens, is the pt aware to call PCP or go to the Emergency Dept.? Yes  Was the patient provided with contact information for the PCP's office or ED? Yes  Was the pt encouraged to call back with questions or concerns? Yes

## 2020-02-02 NOTE — Telephone Encounter (Signed)
Pt says she is feeling good and she has a HFU on 1/24

## 2020-02-13 ENCOUNTER — Other Ambulatory Visit: Payer: Medicare HMO

## 2020-02-13 ENCOUNTER — Ambulatory Visit (INDEPENDENT_AMBULATORY_CARE_PROVIDER_SITE_OTHER): Payer: Medicare HMO | Admitting: Internal Medicine

## 2020-02-13 ENCOUNTER — Other Ambulatory Visit: Payer: Self-pay

## 2020-02-13 ENCOUNTER — Ambulatory Visit (INDEPENDENT_AMBULATORY_CARE_PROVIDER_SITE_OTHER): Payer: Medicare HMO

## 2020-02-13 ENCOUNTER — Encounter: Payer: Self-pay | Admitting: Internal Medicine

## 2020-02-13 VITALS — BP 130/78 | HR 88 | Temp 98.2°F | Resp 16 | Ht 61.0 in | Wt 120.0 lb

## 2020-02-13 DIAGNOSIS — R739 Hyperglycemia, unspecified: Secondary | ICD-10-CM

## 2020-02-13 DIAGNOSIS — D72829 Elevated white blood cell count, unspecified: Secondary | ICD-10-CM | POA: Diagnosis not present

## 2020-02-13 DIAGNOSIS — I4891 Unspecified atrial fibrillation: Secondary | ICD-10-CM

## 2020-02-13 DIAGNOSIS — R21 Rash and other nonspecific skin eruption: Secondary | ICD-10-CM | POA: Diagnosis not present

## 2020-02-13 DIAGNOSIS — E7800 Pure hypercholesterolemia, unspecified: Secondary | ICD-10-CM

## 2020-02-13 DIAGNOSIS — J189 Pneumonia, unspecified organism: Secondary | ICD-10-CM

## 2020-02-13 DIAGNOSIS — I10 Essential (primary) hypertension: Secondary | ICD-10-CM | POA: Diagnosis not present

## 2020-02-13 LAB — HEPATIC FUNCTION PANEL
ALT: 11 U/L (ref 0–35)
AST: 15 U/L (ref 0–37)
Albumin: 4.5 g/dL (ref 3.5–5.2)
Alkaline Phosphatase: 57 U/L (ref 39–117)
Bilirubin, Direct: 0.1 mg/dL (ref 0.0–0.3)
Total Bilirubin: 0.6 mg/dL (ref 0.2–1.2)
Total Protein: 6.6 g/dL (ref 6.0–8.3)

## 2020-02-13 LAB — TSH: TSH: 1.13 u[IU]/mL (ref 0.35–4.50)

## 2020-02-13 LAB — HEMOGLOBIN A1C: Hgb A1c MFr Bld: 5.7 % (ref 4.6–6.5)

## 2020-02-13 LAB — LIPID PANEL
Cholesterol: 288 mg/dL — ABNORMAL HIGH (ref 0–200)
HDL: 65.1 mg/dL (ref >39.00)
NonHDL: 222.86
Total CHOL/HDL Ratio: 4
Triglycerides: 202 mg/dL — ABNORMAL HIGH (ref 0.0–149.0)
VLDL: 40.4 mg/dL — ABNORMAL HIGH (ref 0.0–40.0)

## 2020-02-13 LAB — BASIC METABOLIC PANEL
BUN: 18 mg/dL (ref 6–23)
CO2: 23 mEq/L (ref 19–32)
Calcium: 9.3 mg/dL (ref 8.4–10.5)
Chloride: 107 mEq/L (ref 96–112)
Creatinine, Ser: 0.98 mg/dL (ref 0.40–1.20)
GFR: 53.01 mL/min — ABNORMAL LOW (ref >60.00)
Glucose, Bld: 95 mg/dL (ref 70–99)
Potassium: 4.2 mEq/L (ref 3.5–5.1)
Sodium: 139 mEq/L (ref 135–145)

## 2020-02-13 LAB — LDL CHOLESTEROL, DIRECT: Direct LDL: 192 mg/dL

## 2020-02-13 NOTE — Progress Notes (Addendum)
[name], female   DOB: 1933/09/20, 87 y.o.   MRN: [id number]   Subjective:    [name], female    DOB: 1933-02-09, 87 y.o.   MRN: [id number]  HPI This visit occurred during the SARS-CoV-2 public health emergency.  Safety protocols were in place, including screening questions prior to the visit, additional usage of staff PPE, and extensive cleaning of exam room while observing appropriate contact time as indicated for disinfecting solutions.  Patient here for follow up.  Was admitted 01/17/20 - 01/20/20 for possible stroke and worsening dyspnea and cough.  CXR - right greater than left patchy airspace opacities suspicious for bilateral pneumonia.  EKG revealed afib with controlled ventricular response 99 with LBBB.  Treated with rocephin and zithromax and steroids.   Hydrated.  Was placed on eliquis.  Oxygen weaned off.  Discharged on oral abx.  BNP elevated during hospitalization.  Given lasix.  ECHO - EF 55-60%, moderate LVH with no regional wall motion abnormality.  Since her discharge, she has done well.  Denies any cough or congestion.  No sob. No chest pain or palpitations.  No abdominal pain.  Eating.  No nausea or vomiting.  Bowels moving. States blood pressure is averaging 125-130/60-70.  Needs f/u with cardiology regarding the afib - holter monitor.    Past Medical History:  Diagnosis Date  . Cancer ([place])    skin cancer nose  . Complication of anesthesia    woke up during partial hysterectomy  . Elevated blood pressure   . GERD (gastroesophageal reflux disease)    occ-no meds  . Headache    h/o   . Heart murmur    asymptomatic   Past Surgical History:  Procedure Laterality Date  . ABDOMINAL HYSTERECTOMY  1974   partial  . BREAST SURGERY     IMPLANTS  . CATARACT EXTRACTION W/PHACO Right 02/16/2018   Procedure: CATARACT EXTRACTION PHACO AND INTRAOCULAR LENS PLACEMENT ([place]) RIGHT;  Surgeon: Marchia Meiers, MD;  Location: [facility] ORS;  Service:  Ophthalmology;  Laterality: Right;  [place]  01:10 CDE 11.02 Fluid pack lot # [id number]  . CATARACT EXTRACTION W/PHACO Left 03/18/2018   Procedure: CATARACT EXTRACTION PHACO AND INTRAOCULAR LENS PLACEMENT ([place]) LEFT;  Surgeon: Marchia Meiers, MD;  Location: [facility] ORS;  Service: Ophthalmology;  Laterality: Left;  [place] 01:06.5 CDE 10.80 FLUID PACK LOT # [id number]  . LUMBAR LAMINECTOMY/ DECOMPRESSION WITH MET-RX Bilateral 09/20/2018   Procedure: LUMBAR LAMINECTOMY/ DECOMPRESSION WITH MET-RX;  Surgeon: Deetta Perla, MD;  Location: [facility] ORS;  Service: Neurosurgery;  Laterality: Bilateral;  . TONSILLECTOMY AND ADENOIDECTOMY  1944   Family History  Problem Relation Age of Onset  . Stroke Mother   . Hypertension Mother   . Stroke Father   . Hypertension Father   . Diabetes Other    Social History   Socioeconomic History  . Marital status: Married    Spouse name: Not on file  . Number of children: Not on file  . Years of education: Not on file  . Highest education level: Not on file  Occupational History  . Not on file  Tobacco Use  . Smoking status: Never Smoker  . Smokeless tobacco: Never Used  Vaping Use  . Vaping Use: Never used  Substance and Sexual Activity  . Alcohol use: No    Alcohol/week: 0.0 standard drinks  . Drug use: No  . Sexual activity: Not on file  Other Topics Concern  . Not on file  Social History Narrative  . Not on file   Social Determinants of Health   Financial Resource Strain: Low Risk   . Difficulty of Paying Living Expenses: Not hard at all  Food Insecurity: No Food Insecurity  . Worried About Charity fundraiser in the Last Year: Never true  . Ran Out of Food in the Last Year: Never true  Transportation Needs: No Transportation Needs  . Lack of Transportation (Medical): No  . Lack of Transportation (Non-Medical): No  Physical Activity: Not on file  Stress: No Stress Concern Present  . Feeling of Stress : Not at all  Social Connections: Unknown  .  Frequency of Communication with Friends and Family: Not on file  . Frequency of Social Gatherings with Friends and Family: Not on file  . Attends Religious Services: Not on file  . Active Member of Clubs or Organizations: Not on file  . Attends Archivist Meetings: Not on file  . Marital Status: Married    Outpatient Encounter Medications as of 02/13/2020  Medication Sig  . BREO ELLIPTA 100-25 MCG/INH AEPB SMARTSIG:1 Inhalation Via Inhaler Daily  . Coenzyme Q10 (COQ10) 100 MG CAPS Take 100 mg by mouth daily.   . dorzolamidel-timolol (COSOPT) 22.3-6.8 MG/ML SOLN ophthalmic solution Place 1 drop into both eyes 2 (two) times daily.  . fluticasone (FLOVENT HFA) 110 MCG/ACT inhaler   . latanoprost (XALATAN) 0.005 % ophthalmic solution Place 1 drop into both eyes at bedtime.  . Multiple Vitamins-Minerals (MULTIVITAMIN WITH MINERALS) tablet Take 1 tablet by mouth daily.   Marland Kitchen ROCKLATAN 0.02-0.005 % SOLN SMARTSIG:1 Drop(s) In Eye(s) Every Evening  . vitamin C (ASCORBIC ACID) 500 MG tablet Take 500 mg by mouth daily.   . [DISCONTINUED] albuterol (VENTOLIN HFA) 108 (90 Base) MCG/ACT inhaler Inhale 1-2 puffs into the lungs every 6 (six) hours as needed for wheezing or shortness of breath.  . [DISCONTINUED] apixaban (ELIQUIS) 2.5 MG TABS tablet Take 1 tablet (2.5 mg total) by mouth 2 (two) times daily.   No facility-administered encounter medications on file as of 02/13/2020.    Review of Systems  Constitutional: Negative for appetite change and unexpected weight change.  HENT: Negative for congestion and sinus pressure.   Respiratory: Negative for cough, chest tightness and shortness of breath.   Cardiovascular: Negative for chest pain, palpitations and leg swelling.  Gastrointestinal: Negative for abdominal pain, diarrhea, nausea and vomiting.  Genitourinary: Negative for difficulty urinating and dysuria.  Musculoskeletal: Negative for joint swelling and myalgias.  Skin: Negative for  color change and rash.  Neurological: Negative for dizziness, light-headedness and headaches.  Psychiatric/Behavioral: Negative for agitation and dysphoric mood.       Objective:    Physical Exam Vitals reviewed.  Constitutional:      General: She is not in acute distress.    Appearance: Normal appearance.  HENT:     Head: Normocephalic and atraumatic.     Right Ear: External ear normal.     Left Ear: External ear normal.     Mouth/Throat:     Mouth: Oropharynx is clear and moist.  Eyes:     General: No scleral icterus.       Right eye: No discharge.        Left eye: No discharge.     Conjunctiva/sclera: Conjunctivae normal.  Neck:     Thyroid: No thyromegaly.  Cardiovascular:     Rate and Rhythm: Normal rate.     Comments: Rate controlled.  Pulmonary:  Effort: No respiratory distress.     Breath sounds: Normal breath sounds. No wheezing.  Abdominal:     General: Bowel sounds are normal.     Palpations: Abdomen is soft.     Tenderness: There is no abdominal tenderness.  Musculoskeletal:        General: No swelling, tenderness or edema.     Cervical back: Neck supple. No tenderness.  Lymphadenopathy:     Cervical: No cervical adenopathy.  Skin:    Findings: No erythema or rash.  Neurological:     Mental Status: She is alert.  Psychiatric:        Mood and Affect: Mood normal.        Behavior: Behavior normal.     BP 130/78   Pulse 88   Temp 98.2 F (36.8 C) (Oral)   Resp 16   Ht _0  (1.549 m)   Wt 120 lb (54.4 kg)   SpO2 98%   BMI 22.67 kg/m  Wt Readings from Last 3 Encounters:  02/13/20 120 lb (54.4 kg)  01/17/20 120 lb (54.4 kg)  01/05/20 120 lb (54.4 kg)     Lab Results  Component Value Date   WBC 17.7 (H) 01/20/2020   HGB 11.5 (L) 01/20/2020   HCT 32.8 (L) 01/20/2020   PLT 309 01/20/2020   GLUCOSE 95 02/13/2020   CHOL 288 (H) 02/13/2020   TRIG 202.0 (H) 02/13/2020   HDL 65.10 02/13/2020   LDLDIRECT 192.0 02/13/2020   LDLCALC 211  (H) 12/31/2017   ALT 11 02/13/2020   AST 15 02/13/2020   NA 139 02/13/2020   K 4.2 02/13/2020   CL 107 02/13/2020   CREATININE 0.98 02/13/2020   BUN 18 02/13/2020   CO2 23 02/13/2020   TSH 1.13 02/13/2020   INR 1.1 01/17/2020   HGBA1C 5.5 02/13/2020    DG Chest Port 1 View  Result Date: 01/19/2020 CLINICAL DATA:  Dyspnea EXAM: PORTABLE CHEST 1 VIEW COMPARISON:  01/17/2020 FINDINGS: Lung volumes are small, but are symmetric and pulmonary insufflation remain stable. Superimposed multifocal pulmonary infiltrate is again identified and has progressed, particularly within the right upper lobe. No pneumothorax or pleural effusion. Cardiac size within normal limits. Bilateral breast implants noted. No acute bone abnormality. IMPRESSION: Progressive multifocal pulmonary infiltrates, more focal within the right upper lobe, in keeping with changes of atypical infection in the acute setting. Electronically Signed   By: Fidela Salisbury MD   On: 01/19/2020 05:58   ECHOCARDIOGRAM COMPLETE  Result Date: 01/18/2020    ECHOCARDIOGRAM REPORT   Patient Name:   RONISHA HERRINGSHAW Date of Exam: 01/18/2020 Medical Rec #:  [id number]    Height:       61.0 in Accession #:    [id number]   Weight:       120.0 lb Date of Birth:  1933-08-02    BSA:          1.520 m Patient Age:    74 years     BP:           126/60 mmHg Patient Gender: F            HR:           80 bpm. Exam Location:  [facility] Procedure: 2D Echo, Color Doppler, Cardiac Doppler and Strain Analysis Indications:     I48.91 Atrial fibrillation  History:         Patient has no prior history of Echocardiogram examinations.  Sonographer:  Charmayne Sheer RDCS (AE) Referring Phys:  [id number] Arvella Merles El Castillo Diagnosing Phys: Bartholome Bill MD  Sonographer Comments: Suboptimal parasternal window. Image acquisition challenging due to breast implants. Global longitudinal strain was attempted. IMPRESSIONS  1. Left ventricular ejection fraction, by estimation, is 55 to 60%. The left  ventricle has normal function. The left ventricle has no regional wall motion abnormalities. There is moderate left ventricular hypertrophy. Left ventricular diastolic parameters are consistent with Grade I diastolic dysfunction (impaired relaxation).  2. Right ventricular systolic function is normal. The right ventricular size is normal.  3. The mitral valve is grossly normal. Trivial mitral valve regurgitation.  4. The aortic valve is grossly normal. Aortic valve regurgitation is trivial. FINDINGS  Left Ventricle: Left ventricular ejection fraction, by estimation, is 55 to 60%. The left ventricle has normal function. The left ventricle has no regional wall motion abnormalities. The left ventricular internal cavity size was normal in size. There is  moderate left ventricular hypertrophy. Left ventricular diastolic parameters are consistent with Grade I diastolic dysfunction (impaired relaxation). Right Ventricle: The right ventricular size is normal. No increase in right ventricular wall thickness. Right ventricular systolic function is normal. Left Atrium: Left atrial size was normal in size. Right Atrium: Right atrial size was normal in size. Pericardium: There is no evidence of pericardial effusion. Mitral Valve: The mitral valve is grossly normal. Trivial mitral valve regurgitation. MV peak gradient, 6.2 mmHg. The mean mitral valve gradient is 3.0 mmHg. Tricuspid Valve: The tricuspid valve is grossly normal. Tricuspid valve regurgitation is trivial. Aortic Valve: The aortic valve is grossly normal. Aortic valve regurgitation is trivial. Aortic valve mean gradient measures 9.0 mmHg. Aortic valve peak gradient measures 14.7 mmHg. Aortic valve area, by VTI measures 1.18 cm. Pulmonic Valve: The pulmonic valve was not assessed. Pulmonic valve regurgitation is not visualized. Aorta: The aortic root is normal in size and structure. IAS/Shunts: The interatrial septum was not assessed.  LEFT VENTRICLE PLAX 2D LVIDd:          4.07 cm  Diastology LVIDs:         2.85 cm  LV e' medial:    5.33 cm/s LV PW:         1.05 cm  LV E/e' medial:  17.3 LV IVS:        0.88 cm  LV e' lateral:   6.53 cm/s LVOT diam:     1.90 cm  LV E/e' lateral: 14.1 LV SV:         46 LV SV Index:   31 LVOT Area:     2.84 cm  RIGHT VENTRICLE RV Basal diam:  2.52 cm LEFT ATRIUM             Index       RIGHT ATRIUM           Index LA Vol (A2C):   24.7 ml 16.25 ml/m RA Area:     10.90 cm LA Vol (A4C):   25.8 ml 16.97 ml/m RA Volume:   26.80 ml  17.63 ml/m LA Biplane Vol: 27.1 ml 17.83 ml/m  AORTIC VALVE                    PULMONIC VALVE AV Area (Vmax):    1.31 cm     PV Vmax:       0.85 m/s AV Area (Vmean):   1.32 cm     PV Vmean:      60.100 cm/s AV Area (VTI):  1.18 cm     PV VTI:        0.150 m AV Vmax:           192.00 cm/s  PV Peak grad:  2.9 mmHg AV Vmean:          137.000 cm/s PV Mean grad:  2.0 mmHg AV VTI:            0.395 m AV Peak Grad:      14.7 mmHg AV Mean Grad:      9.0 mmHg LVOT Vmax:         88.80 cm/s LVOT Vmean:        63.800 cm/s LVOT VTI:          0.164 m LVOT/AV VTI ratio: 0.42  AORTA Ao Root diam: 2.60 cm MITRAL VALVE MV Area (PHT): 4.19 cm     SHUNTS MV Peak grad:  6.2 mmHg     Systemic VTI:  0.16 m MV Mean grad:  3.0 mmHg     Systemic Diam: 1.90 cm MV Vmax:       1.25 m/s MV Vmean:      77.4 cm/s MV Decel Time: 181 msec MV E velocity: 92.10 cm/s MV A velocity: 131.00 cm/s MV E/A ratio:  0.70 Bartholome Bill MD Electronically signed by Bartholome Bill MD Signature Date/Time: 01/18/2020/4:55:43 PM    Final        Assessment & Plan:   Problem List Items Addressed This Visit    A-fib [facility])    Noted in hospital.  On eliquis.  Needs f/u with cardiology - holter - to determine persistent afib, etc. Continue eliquis.  Arrange f/u with cardiology.        Relevant Orders   Ambulatory referral to Cardiology   CAP (community acquired pneumonia) - Primary    Recently admitted and found to have pneumonia.  Treated with steroids, abx.   Completed oral abx.  Doing well.  Feels better.  No sob.  No chest pain or tightness.  No cough or congestion.  Recheck cxr today.        Relevant Orders   DG Chest 2 View (Completed)   Essential hypertension    Not on any medication.  Blood pressure better.  Follow. Check metabolic panel.       Relevant Orders   TSH (Completed)   Basic metabolic panel (Completed)   Hypercholesterolemia    Declines cholesterol medication.  Low cholesterol diet and exercise.  Follow lipid panel.       Relevant Orders   Hepatic function panel (Completed)   Lipid panel (Completed)   LDL cholesterol, direct (Completed)   Leukocytosis    Elevated white blood cell count in hospital.  Was on steroids.  Recheck cbc today.        Relevant Orders   CBC with Differential/Platelet    Other Visit Diagnoses    Hyperglycemia       Relevant Orders   Hemoglobin A1c (Completed)       Einar Pheasant, MD

## 2020-02-14 LAB — HEMOGLOBIN A1C
Hgb A1c MFr Bld: 5.5 % of total Hgb (ref <5.7)
Mean Plasma Glucose: 111 mg/dL
eAG (mmol/L): 6.2 mmol/L

## 2020-02-16 ENCOUNTER — Other Ambulatory Visit: Payer: Self-pay | Admitting: Internal Medicine

## 2020-02-19 ENCOUNTER — Encounter: Payer: Self-pay | Admitting: Internal Medicine

## 2020-02-19 DIAGNOSIS — I4891 Unspecified atrial fibrillation: Secondary | ICD-10-CM | POA: Insufficient documentation

## 2020-02-19 NOTE — Assessment & Plan Note (Signed)
Recently admitted and found to have pneumonia.  Treated with steroids, abx.  Completed oral abx.  Doing well.  Feels better.  No sob.  No chest pain or tightness.  No cough or congestion.  Recheck cxr today.

## 2020-02-19 NOTE — Assessment & Plan Note (Signed)
Noted in hospital.  On eliquis.  Needs f/u with cardiology - holter - to determine persistent afib, etc. Continue eliquis.  Arrange f/u with cardiology.

## 2020-02-19 NOTE — Assessment & Plan Note (Signed)
Not on any medication.  Blood pressure better.  Follow. Check metabolic panel.

## 2020-02-19 NOTE — Addendum Note (Signed)
Addended by: Alisa Graff on: 02/19/2020 03:15 AM   Modules accepted: Orders

## 2020-02-19 NOTE — Assessment & Plan Note (Signed)
Declines cholesterol medication.  Low cholesterol diet and exercise.  Follow lipid panel.   

## 2020-02-19 NOTE — Assessment & Plan Note (Signed)
Elevated white blood cell count in hospital.  Was on steroids.  Recheck cbc today.

## 2020-03-26 DIAGNOSIS — I509 Heart failure, unspecified: Secondary | ICD-10-CM | POA: Diagnosis not present

## 2020-03-26 DIAGNOSIS — J449 Chronic obstructive pulmonary disease, unspecified: Secondary | ICD-10-CM | POA: Diagnosis not present

## 2020-03-26 DIAGNOSIS — I480 Paroxysmal atrial fibrillation: Secondary | ICD-10-CM | POA: Diagnosis not present

## 2020-03-26 DIAGNOSIS — I208 Other forms of angina pectoris: Secondary | ICD-10-CM | POA: Diagnosis not present

## 2020-03-26 DIAGNOSIS — E7800 Pure hypercholesterolemia, unspecified: Secondary | ICD-10-CM | POA: Diagnosis not present

## 2020-03-26 DIAGNOSIS — K219 Gastro-esophageal reflux disease without esophagitis: Secondary | ICD-10-CM | POA: Diagnosis not present

## 2020-03-26 DIAGNOSIS — I10 Essential (primary) hypertension: Secondary | ICD-10-CM | POA: Diagnosis not present

## 2020-03-26 DIAGNOSIS — I429 Cardiomyopathy, unspecified: Secondary | ICD-10-CM | POA: Diagnosis not present

## 2020-04-02 DIAGNOSIS — I480 Paroxysmal atrial fibrillation: Secondary | ICD-10-CM | POA: Diagnosis not present

## 2020-04-05 DIAGNOSIS — H35371 Puckering of macula, right eye: Secondary | ICD-10-CM | POA: Diagnosis not present

## 2020-04-05 DIAGNOSIS — H31092 Other chorioretinal scars, left eye: Secondary | ICD-10-CM | POA: Diagnosis not present

## 2020-04-05 DIAGNOSIS — H3540 Unspecified peripheral retinal degeneration: Secondary | ICD-10-CM | POA: Diagnosis not present

## 2020-04-05 DIAGNOSIS — H4010X2 Unspecified open-angle glaucoma, moderate stage: Secondary | ICD-10-CM | POA: Diagnosis not present

## 2020-04-16 DIAGNOSIS — J449 Chronic obstructive pulmonary disease, unspecified: Secondary | ICD-10-CM | POA: Diagnosis not present

## 2020-04-24 DIAGNOSIS — I429 Cardiomyopathy, unspecified: Secondary | ICD-10-CM | POA: Diagnosis not present

## 2020-04-24 DIAGNOSIS — I10 Essential (primary) hypertension: Secondary | ICD-10-CM | POA: Diagnosis not present

## 2020-04-24 DIAGNOSIS — I480 Paroxysmal atrial fibrillation: Secondary | ICD-10-CM | POA: Diagnosis not present

## 2020-04-24 DIAGNOSIS — K219 Gastro-esophageal reflux disease without esophagitis: Secondary | ICD-10-CM | POA: Diagnosis not present

## 2020-04-24 DIAGNOSIS — E7800 Pure hypercholesterolemia, unspecified: Secondary | ICD-10-CM | POA: Diagnosis not present

## 2020-04-24 DIAGNOSIS — J449 Chronic obstructive pulmonary disease, unspecified: Secondary | ICD-10-CM | POA: Diagnosis not present

## 2020-04-24 DIAGNOSIS — I509 Heart failure, unspecified: Secondary | ICD-10-CM | POA: Diagnosis not present

## 2020-04-24 DIAGNOSIS — I447 Left bundle-branch block, unspecified: Secondary | ICD-10-CM | POA: Diagnosis not present

## 2020-04-24 DIAGNOSIS — R5383 Other fatigue: Secondary | ICD-10-CM | POA: Diagnosis not present

## 2020-05-17 ENCOUNTER — Other Ambulatory Visit: Payer: Self-pay

## 2020-05-17 ENCOUNTER — Ambulatory Visit (INDEPENDENT_AMBULATORY_CARE_PROVIDER_SITE_OTHER): Payer: Medicare HMO | Admitting: Internal Medicine

## 2020-05-17 VITALS — BP 148/80 | HR 72 | Temp 96.3°F | Resp 16 | Ht 61.0 in | Wt 118.8 lb

## 2020-05-17 DIAGNOSIS — I4891 Unspecified atrial fibrillation: Secondary | ICD-10-CM | POA: Diagnosis not present

## 2020-05-17 DIAGNOSIS — R011 Cardiac murmur, unspecified: Secondary | ICD-10-CM

## 2020-05-17 DIAGNOSIS — I10 Essential (primary) hypertension: Secondary | ICD-10-CM | POA: Diagnosis not present

## 2020-05-17 DIAGNOSIS — E7800 Pure hypercholesterolemia, unspecified: Secondary | ICD-10-CM

## 2020-05-17 DIAGNOSIS — E079 Disorder of thyroid, unspecified: Secondary | ICD-10-CM | POA: Diagnosis not present

## 2020-05-17 DIAGNOSIS — R0989 Other specified symptoms and signs involving the circulatory and respiratory systems: Secondary | ICD-10-CM | POA: Diagnosis not present

## 2020-05-17 DIAGNOSIS — Z0000 Encounter for general adult medical examination without abnormal findings: Secondary | ICD-10-CM

## 2020-05-17 DIAGNOSIS — R739 Hyperglycemia, unspecified: Secondary | ICD-10-CM

## 2020-05-17 LAB — CBC WITH DIFFERENTIAL/PLATELET
Basophils Absolute: 0.1 10*3/uL (ref 0.0–0.1)
Basophils Relative: 0.8 % (ref 0.0–3.0)
Eosinophils Absolute: 0.5 10*3/uL (ref 0.0–0.7)
Eosinophils Relative: 8 % — ABNORMAL HIGH (ref 0.0–5.0)
HCT: 35.8 % — ABNORMAL LOW (ref 36.0–46.0)
Hemoglobin: 12.8 g/dL (ref 12.0–15.0)
Lymphocytes Relative: 36.5 % (ref 12.0–46.0)
Lymphs Abs: 2.2 10*3/uL (ref 0.7–4.0)
MCHC: 35.7 g/dL (ref 30.0–36.0)
MCV: 100 fl (ref 78.0–100.0)
Monocytes Absolute: 0.5 10*3/uL (ref 0.1–1.0)
Monocytes Relative: 8.3 % (ref 3.0–12.0)
Neutro Abs: 2.8 10*3/uL (ref 1.4–7.7)
Neutrophils Relative %: 46.4 % (ref 43.0–77.0)
Platelets: 291 10*3/uL (ref 150.0–400.0)
RBC: 3.58 Mil/uL — ABNORMAL LOW (ref 3.87–5.11)
RDW: 14.3 % (ref 11.5–15.5)
WBC: 6 10*3/uL (ref 4.0–10.5)

## 2020-05-17 LAB — BASIC METABOLIC PANEL
BUN: 20 mg/dL (ref 6–23)
CO2: 26 mEq/L (ref 19–32)
Calcium: 9.3 mg/dL (ref 8.4–10.5)
Chloride: 105 mEq/L (ref 96–112)
Creatinine, Ser: 0.8 mg/dL (ref 0.40–1.20)
GFR: 67.5 mL/min (ref >60.00)
Glucose, Bld: 89 mg/dL (ref 70–99)
Potassium: 4.4 mEq/L (ref 3.5–5.1)
Sodium: 138 mEq/L (ref 135–145)

## 2020-05-17 LAB — LIPID PANEL
Cholesterol: 276 mg/dL — ABNORMAL HIGH (ref 0–200)
HDL: 67.3 mg/dL (ref >39.00)
LDL Cholesterol: 187 mg/dL — ABNORMAL HIGH (ref 0–99)
NonHDL: 208.65
Total CHOL/HDL Ratio: 4
Triglycerides: 106 mg/dL (ref 0.0–149.0)
VLDL: 21.2 mg/dL (ref 0.0–40.0)

## 2020-05-17 LAB — HEMOGLOBIN A1C: Hgb A1c MFr Bld: 5.7 % (ref 4.6–6.5)

## 2020-05-17 LAB — HEPATIC FUNCTION PANEL
ALT: 14 U/L (ref 0–35)
AST: 17 U/L (ref 0–37)
Albumin: 4.3 g/dL (ref 3.5–5.2)
Alkaline Phosphatase: 57 U/L (ref 39–117)
Bilirubin, Direct: 0.1 mg/dL (ref 0.0–0.3)
Total Bilirubin: 0.7 mg/dL (ref 0.2–1.2)
Total Protein: 6.4 g/dL (ref 6.0–8.3)

## 2020-05-17 NOTE — Assessment & Plan Note (Signed)
Physical today 05/17/20.  Declines mammogram and colonoscopy.

## 2020-05-17 NOTE — Progress Notes (Signed)
[name], female   DOB: Jun 26, 1933, 87 y.o.   MRN: [id number]   Subjective:    [name], female    DOB: 04/20/1933, 87 y.o.   MRN: [id number]  HPI This visit occurred during the SARS-CoV-2 public health emergency.  Safety protocols were in place, including screening questions prior to the visit, additional usage of staff PPE, and extensive cleaning of exam room while observing appropriate contact time as indicated for disinfecting solutions.  Patient here for her physical exam.  Recently saw cardiology 04/24/20 - f/u paroxysmal afib on eliquis.  History of CHF - EF 55-60% last echo (moderate LVH with G1DD).   Waiting for holter results.  She reports she is still working.  Increased physical activity.  No chest pain or sob reported.  No increased heart rate or palpitations reported.  Last cxr ok.  No acid reflux reported.  No abdominal pain.  Bowels moving.  Overall she feels good.    Past Medical History:  Diagnosis Date  . Cancer ([place])    skin cancer nose  . Complication of anesthesia    woke up during partial hysterectomy  . Elevated blood pressure   . GERD (gastroesophageal reflux disease)    occ-no meds  . Headache    h/o   . Heart murmur    asymptomatic   Past Surgical History:  Procedure Laterality Date  . ABDOMINAL HYSTERECTOMY  1974   partial  . BREAST SURGERY     IMPLANTS  . CATARACT EXTRACTION W/PHACO Right 02/16/2018   Procedure: CATARACT EXTRACTION PHACO AND INTRAOCULAR LENS PLACEMENT ([place]) RIGHT;  Surgeon: Marchia Meiers, MD;  Location: [facility] ORS;  Service: Ophthalmology;  Laterality: Right;  [place]  01:10 CDE 11.02 Fluid pack lot # [id number]  . CATARACT EXTRACTION W/PHACO Left 03/18/2018   Procedure: CATARACT EXTRACTION PHACO AND INTRAOCULAR LENS PLACEMENT ([place]) LEFT;  Surgeon: Marchia Meiers, MD;  Location: [facility] OR[facility];  Service: Ophthalmology;  Laterality: Left;  [place] 01:06.5 CDE 10.80 FLUID PACK LOT # [id number]  . LUMBAR LAMINECTOMY/ DECOMPRESSION  WITH MET-RX Bilateral 09/20/2018   Procedure: LUMBAR LAMINECTOMY/ DECOMPRESSION WITH MET-RX;  Surgeon: Deetta Perla, MD;  Location: [facility] ORS;  Service: Neurosurgery;  Laterality: Bilateral;  . TONSILLECTOMY AND ADENOIDECTOMY  1944   Family History  Problem Relation Age of Onset  . Stroke Mother   . Hypertension Mother   . Stroke Father   . Hypertension Father   . Diabetes Other    Social History   Socioeconomic History  . Marital status: Married    Spouse name: Not on file  . Number of children: Not on file  . Years of education: Not on file  . Highest education level: Not on file  Occupational History  . Not on file  Tobacco Use  . Smoking status: Never Smoker  . Smokeless tobacco: Never Used  Vaping Use  . Vaping Use: Never used  Substance and Sexual Activity  . Alcohol use: No    Alcohol/week: 0.0 standard drinks  . Drug use: No  . Sexual activity: Not on file  Other Topics Concern  . Not on file  Social History Narrative  . Not on file   Social Determinants of Health   Financial Resource Strain: Low Risk   . Difficulty of Paying Living Expenses: Not hard at all  Food Insecurity: No Food Insecurity  . Worried About Charity fundraiser in the Last Year: Never true  . Ran Out of Food in  the Last Year: Never true  Transportation Needs: No Transportation Needs  . Lack of Transportation (Medical): No  . Lack of Transportation (Non-Medical): No  Physical Activity: Not on file  Stress: No Stress Concern Present  . Feeling of Stress : Not at all  Social Connections: Unknown  . Frequency of Communication with Friends and Family: Not on file  . Frequency of Social Gatherings with Friends and Family: Not on file  . Attends Religious Services: Not on file  . Active Member of Clubs or Organizations: Not on file  . Attends Archivist Meetings: Not on file  . Marital Status: Married    Outpatient Encounter Medications as of 05/17/2020  Medication Sig  . BREO  ELLIPTA 100-25 MCG/INH AEPB SMARTSIG:1 Inhalation Via Inhaler Daily  . Coenzyme Q10 (COQ10) 100 MG CAPS Take 100 mg by mouth daily.   . dorzolamidel-timolol (COSOPT) 22.3-6.8 MG/ML SOLN ophthalmic solution Place 1 drop into both eyes 2 (two) times daily.  Marland Kitchen ELIQUIS 2.5 MG TABS tablet TAKE ONE (1) TABLET BY MOUTH TWO TIMES PER DAY  . fluticasone (FLOVENT HFA) 110 MCG/ACT inhaler   . latanoprost (XALATAN) 0.005 % ophthalmic solution Place 1 drop into both eyes at bedtime.  . Multiple Vitamins-Minerals (MULTIVITAMIN WITH MINERALS) tablet Take 1 tablet by mouth daily.   Marland Kitchen ROCKLATAN 0.02-0.005 % SOLN SMARTSIG:1 Drop(s) In Eye(s) Every Evening  . vitamin C (ASCORBIC ACID) 500 MG tablet Take 500 mg by mouth daily.    No facility-administered encounter medications on file as of 05/17/2020.    Review of Systems  Constitutional: Negative for appetite change and unexpected weight change.  HENT: Negative for congestion, sinus pressure and sore throat.   Eyes: Negative for pain and visual disturbance.  Respiratory: Negative for cough, chest tightness and shortness of breath.   Cardiovascular: Negative for chest pain, palpitations and leg swelling.  Gastrointestinal: Negative for abdominal pain, diarrhea, nausea and vomiting.  Genitourinary: Negative for difficulty urinating and dysuria.  Musculoskeletal: Negative for back pain and joint swelling.  Skin: Negative for color change and rash.  Neurological: Negative for dizziness, light-headedness and headaches.  Hematological: Negative for adenopathy. Does not bruise/bleed easily.  Psychiatric/Behavioral: Negative for agitation and dysphoric mood.       Objective:    Physical Exam Constitutional:      General: She is not in acute distress.    Appearance: Normal appearance. She is well-developed.  HENT:     Head: Normocephalic and atraumatic.     Right Ear: External ear normal.     Left Ear: External ear normal.  Eyes:     General: No scleral  icterus.       Right eye: No discharge.        Left eye: No discharge.     Conjunctiva/sclera: Conjunctivae normal.  Neck:     Thyroid: No thyromegaly.     Comments: Right carotid bruit.  Cardiovascular:     Rate and Rhythm: Normal rate and regular rhythm.  Pulmonary:     Effort: No tachypnea, accessory muscle usage or respiratory distress.     Breath sounds: Normal breath sounds. No decreased breath sounds or wheezing.  Chest:  Breasts:     Right: No inverted nipple, mass, nipple discharge or tenderness (no axillary adenopathy).     Left: No inverted nipple, mass, nipple discharge or tenderness (no axilarry adenopathy).    Abdominal:     General: Bowel sounds are normal.     Palpations: Abdomen is soft.  Tenderness: There is no abdominal tenderness.  Musculoskeletal:        General: No swelling or tenderness.     Cervical back: Neck supple. No tenderness.  Lymphadenopathy:     Cervical: No cervical adenopathy.  Skin:    Findings: No erythema or rash.  Neurological:     Mental Status: She is alert and oriented to person, place, and time.  Psychiatric:        Mood and Affect: Mood normal.        Behavior: Behavior normal.     BP (!) 148/80   Pulse 72   Temp (!) 96.3 F (35.7 C) (Temporal)   Resp 16   Ht $R'5\' 1"'SY$  (1.549 m)   Wt 118 lb 12.8 oz (53.9 kg)   SpO2 98%   BMI 22.45 kg/m  Wt Readings from Last 3 Encounters:  05/17/20 118 lb 12.8 oz (53.9 kg)  02/13/20 120 lb (54.4 kg)  01/17/20 120 lb (54.4 kg)     Lab Results  Component Value Date   WBC 6.0 05/17/2020   HGB 12.8 05/17/2020   HCT 35.8 (L) 05/17/2020   PLT 291.0 05/17/2020   GLUCOSE 89 05/17/2020   CHOL 276 (H) 05/17/2020   TRIG 106.0 05/17/2020   HDL 67.30 05/17/2020   LDLDIRECT 192.0 02/13/2020   LDLCALC 187 (H) 05/17/2020   ALT 14 05/17/2020   AST 17 05/17/2020   NA 138 05/17/2020   K 4.4 05/17/2020   CL 105 05/17/2020   CREATININE 0.80 05/17/2020   BUN 20 05/17/2020   CO2 26  05/17/2020   TSH 1.13 02/13/2020   INR 1.1 01/17/2020   HGBA1C 5.7 05/17/2020    DG Chest Port 1 View  Result Date: 01/19/2020 CLINICAL DATA:  Dyspnea EXAM: PORTABLE CHEST 1 VIEW COMPARISON:  01/17/2020 FINDINGS: Lung volumes are small, but are symmetric and pulmonary insufflation remain stable. Superimposed multifocal pulmonary infiltrate is again identified and has progressed, particularly within the right upper lobe. No pneumothorax or pleural effusion. Cardiac size within normal limits. Bilateral breast implants noted. No acute bone abnormality. IMPRESSION: Progressive multifocal pulmonary infiltrates, more focal within the right upper lobe, in keeping with changes of atypical infection in the acute setting. Electronically Signed   By: Fidela Salisbury MD   On: 01/19/2020 05:58   ECHOCARDIOGRAM COMPLETE  Result Date: 01/18/2020    ECHOCARDIOGRAM REPORT   Patient Name:   SHERITTA DEEG Date of Exam: 01/18/2020 Medical Rec #:  [id number]    Height:       61.0 in Accession #:    [id number]   Weight:       120.0 lb Date of Birth:  [birth year]    BSA:          1.520 m Patient Age:    17 years     BP:           126/60 mmHg Patient Gender: F            HR:           80 bpm. Exam Location:  [facility] Procedure: 2D Echo, Color Doppler, Cardiac Doppler and Strain Analysis Indications:     I48.91 Atrial fibrillation  History:         Patient has no prior history of Echocardiogram examinations.  Sonographer:     Charmayne Sheer RDCS (AE) Referring Phys:  [id number] Arvella Merles [place] Diagnosing Phys: Bartholome Bill MD  Sonographer Comments: Suboptimal parasternal window. Image acquisition challenging due to breast  implants. Global longitudinal strain was attempted. IMPRESSIONS  1. Left ventricular ejection fraction, by estimation, is 55 to 60%. The left ventricle has normal function. The left ventricle has no regional wall motion abnormalities. There is moderate left ventricular hypertrophy. Left ventricular diastolic parameters are  consistent with Grade I diastolic dysfunction (impaired relaxation).  2. Right ventricular systolic function is normal. The right ventricular size is normal.  3. The mitral valve is grossly normal. Trivial mitral valve regurgitation.  4. The aortic valve is grossly normal. Aortic valve regurgitation is trivial. FINDINGS  Left Ventricle: Left ventricular ejection fraction, by estimation, is 55 to 60%. The left ventricle has normal function. The left ventricle has no regional wall motion abnormalities. The left ventricular internal cavity size was normal in size. There is  moderate left ventricular hypertrophy. Left ventricular diastolic parameters are consistent with Grade I diastolic dysfunction (impaired relaxation). Right Ventricle: The right ventricular size is normal. No increase in right ventricular wall thickness. Right ventricular systolic function is normal. Left Atrium: Left atrial size was normal in size. Right Atrium: Right atrial size was normal in size. Pericardium: There is no evidence of pericardial effusion. Mitral Valve: The mitral valve is grossly normal. Trivial mitral valve regurgitation. MV peak gradient, 6.2 mmHg. The mean mitral valve gradient is 3.0 mmHg. Tricuspid Valve: The tricuspid valve is grossly normal. Tricuspid valve regurgitation is trivial. Aortic Valve: The aortic valve is grossly normal. Aortic valve regurgitation is trivial. Aortic valve mean gradient measures 9.0 mmHg. Aortic valve peak gradient measures 14.7 mmHg. Aortic valve area, by VTI measures 1.18 cm. Pulmonic Valve: The pulmonic valve was not assessed. Pulmonic valve regurgitation is not visualized. Aorta: The aortic root is normal in size and structure. IAS/Shunts: The interatrial septum was not assessed.  LEFT VENTRICLE PLAX 2D LVIDd:         4.07 cm  Diastology LVIDs:         2.85 cm  LV e' medial:    5.33 cm/s LV PW:         1.05 cm  LV E/e' medial:  17.3 LV IVS:        0.88 cm  LV e' lateral:   6.53 cm/s LVOT  diam:     1.90 cm  LV E/e' lateral: 14.1 LV SV:         46 LV SV Index:   31 LVOT Area:     2.84 cm  RIGHT VENTRICLE RV Basal diam:  2.52 cm LEFT ATRIUM             Index       RIGHT ATRIUM           Index LA Vol (A2C):   24.7 ml 16.25 ml/m RA Area:     10.90 cm LA Vol (A4C):   25.8 ml 16.97 ml/m RA Volume:   26.80 ml  17.63 ml/m LA Biplane Vol: 27.1 ml 17.83 ml/m  AORTIC VALVE                    PULMONIC VALVE AV Area (Vmax):    1.31 cm     PV Vmax:       0.85 m/s AV Area (Vmean):   1.32 cm     PV Vmean:      60.100 cm/s AV Area (VTI):     1.18 cm     PV VTI:        0.150 m AV Vmax:  192.00 cm/s  PV Peak grad:  2.9 mmHg AV Vmean:          137.000 cm/s PV Mean grad:  2.0 mmHg AV VTI:            0.395 m AV Peak Grad:      14.7 mmHg AV Mean Grad:      9.0 mmHg LVOT Vmax:         88.80 cm/s LVOT Vmean:        63.800 cm/s LVOT VTI:          0.164 m LVOT/AV VTI ratio: 0.42  AORTA Ao Root diam: 2.60 cm MITRAL VALVE MV Area (PHT): 4.19 cm     SHUNTS MV Peak grad:  6.2 mmHg     Systemic VTI:  0.16 m MV Mean grad:  3.0 mmHg     Systemic Diam: 1.90 cm MV Vmax:       1.25 m/s MV Vmean:      77.4 cm/s MV Decel Time: 181 msec MV E velocity: 92.10 cm/s MV A velocity: 131.00 cm/s MV E/A ratio:  0.70 Bartholome Bill MD Electronically signed by Bartholome Bill MD Signature Date/Time: 01/18/2020/4:55:43 PM    Final        Assessment & Plan:   Problem List Items Addressed This Visit    A-fib [facility])    Waiting for holter results.  ECHO as outlined.  She denies any increased heart rate or palpitations.  Breathing stable.  No increased sob.  Continue eliquis.       Essential hypertension    Blood pressure elevated.  Discussed with her today.  Discussed recommendation to start a blood pressure medication.  She continues to decline.  Discussed increased risk for stroke, etc.  She continues to decline.  She will monitor blood pressure. Send in readings.  Follow metabolic panel.       Relevant Orders   CBC with  Differential/Platelet (Completed)   Basic metabolic panel (Completed)   Health care maintenance    Physical today 05/17/20.  Declines mammogram and colonoscopy.       Heart murmur    Recent echo as outlined.  Follow.  Denies sob.       Hypercholesterolemia - Primary    Have discussed recommendation to start cholesterol medication.  She declines.  Low cholesterol diet and exercise.  Follow lipid panel.       Relevant Orders   Hepatic function panel (Completed)   Lipid panel (Completed)   Hyperglycemia    Low carb diet and exercise.  Follow met b and a1c.       Relevant Orders   Hemoglobin A1c (Completed)   Right carotid bruit    Right carotid bruit noted on exam.  Check carotid ultrasound.       Relevant Orders   VAS US CAROTID   Thyroid mass    Thyroid mass noted on previous scan. Have discussed further w/up and follow up. She has declined.  Follow.           Einar Pheasant, MD

## 2020-05-21 ENCOUNTER — Encounter: Payer: Self-pay | Admitting: Internal Medicine

## 2020-05-21 DIAGNOSIS — R0989 Other specified symptoms and signs involving the circulatory and respiratory systems: Secondary | ICD-10-CM | POA: Insufficient documentation

## 2020-05-21 NOTE — Assessment & Plan Note (Signed)
Right carotid bruit noted on exam.  Check carotid ultrasound.   

## 2020-05-21 NOTE — Assessment & Plan Note (Signed)
Have discussed recommendation to start cholesterol medication.  She declines.  Low cholesterol diet and exercise.  Follow lipid panel.  

## 2020-05-21 NOTE — Assessment & Plan Note (Signed)
Low carb diet and exercise.  Follow met b and a1c.  

## 2020-05-21 NOTE — Assessment & Plan Note (Signed)
Recent echo as outlined.  Follow.  Denies sob.

## 2020-05-21 NOTE — Assessment & Plan Note (Signed)
Thyroid mass noted on previous scan. Have discussed further w/up and follow up. She has declined.  Follow.  

## 2020-05-21 NOTE — Assessment & Plan Note (Signed)
Waiting for holter results.  ECHO as outlined.  She denies any increased heart rate or palpitations.  Breathing stable.  No increased sob.  Continue eliquis.

## 2020-05-21 NOTE — Assessment & Plan Note (Signed)
Blood pressure elevated.  Discussed with her today.  Discussed recommendation to start a blood pressure medication.  She continues to decline.  Discussed increased risk for stroke, etc.  She continues to decline.  She will monitor blood pressure. Send in readings.  Follow metabolic panel.

## 2020-06-07 ENCOUNTER — Telehealth (INDEPENDENT_AMBULATORY_CARE_PROVIDER_SITE_OTHER): Payer: Self-pay | Admitting: Internal Medicine

## 2020-06-07 NOTE — Telephone Encounter (Signed)
lvm for patient to callback to be scheduled  Patient referred to [place] by dr.scott lo. Carotid.

## 2020-06-30 IMAGING — MR MR LUMBAR SPINE W/O CM
5 of 7 series · 31 of 48 positions shown · non-contrast
Comparison: None.

CLINICAL DATA: 84 y/o F; lower back pain with right leg pain for 4
months.

EXAM:
MRI LUMBAR SPINE WITHOUT CONTRAST
TECHNIQUE: Multiplanar, multisequence MR imaging of the lumbar spine was
performed. No intravenous contrast was administered.

[Series 5: T2 · sagittal · 4.0mm · 0.81mm/px · 8 of 17 slices shown (1 of 3)]
[im 1/17]
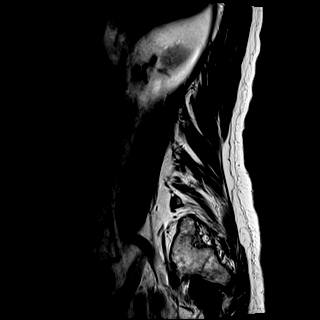
[im 3/17]
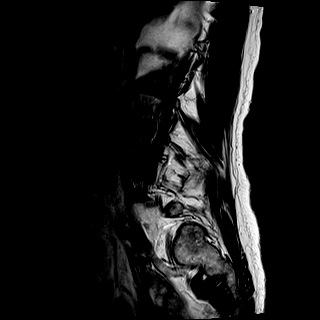
[im 5/17]
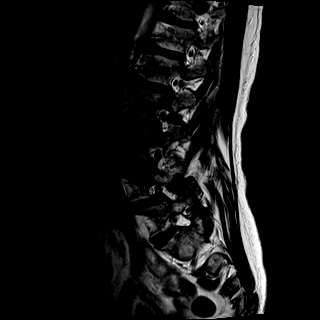
[im 7/17]
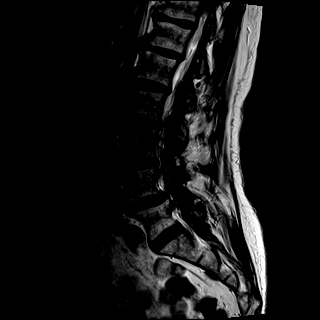
[im 10/17]
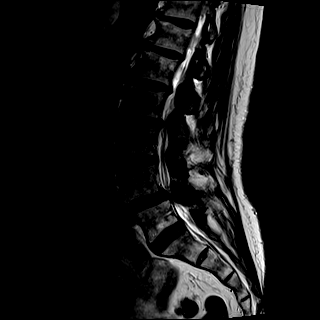
[im 12/17]
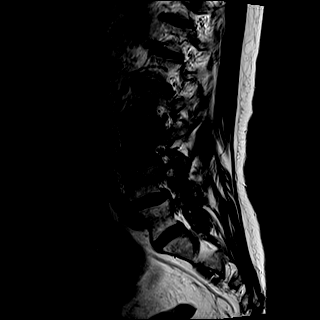
[im 14/17]
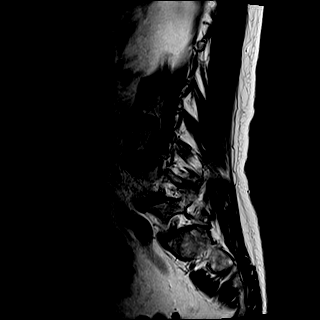
[im 17/17]
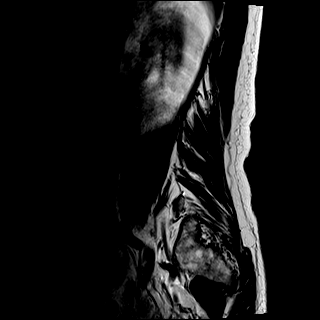

[Series 6: T1 · sagittal · 4.0mm · 0.81mm/px · 7 of 17 slices shown (1 of 2)]
[im 1/17]
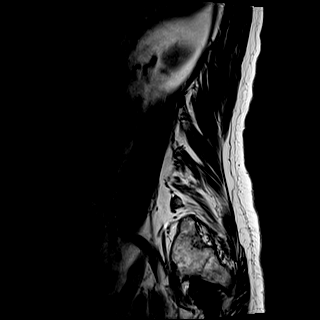
[im 3/17]
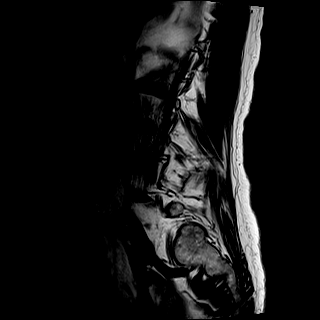
[im 6/17]
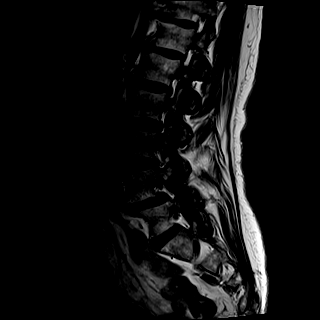
[im 9/17]
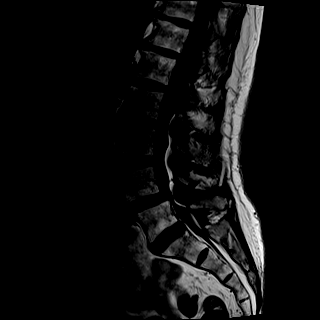
[im 11/17]
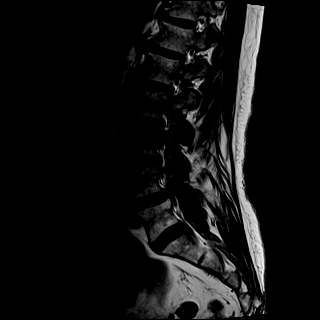
[im 14/17]
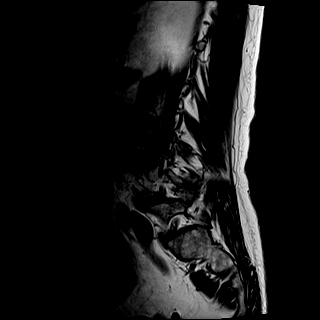
[im 17/17]
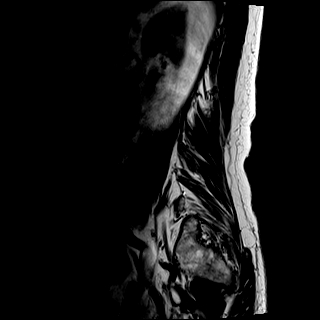

[Series 8: T2 · axial · 4.0mm · 0.57mm/px · z∈[+31,+120]mm · 8 of 19 slices shown (2 of 3)]
[im 1/19]
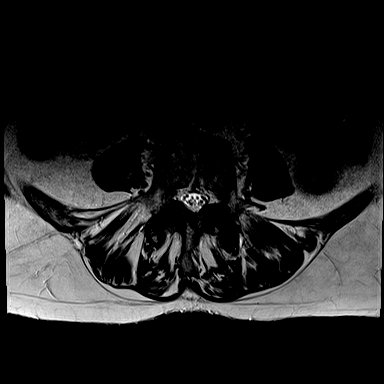
[im 3/19]
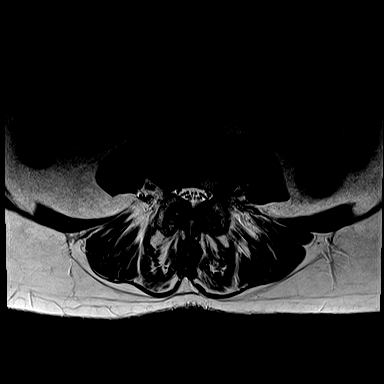
[im 6/19]
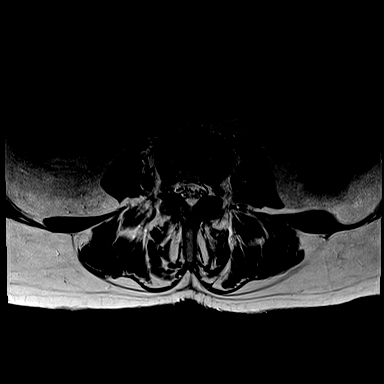
[im 8/19]
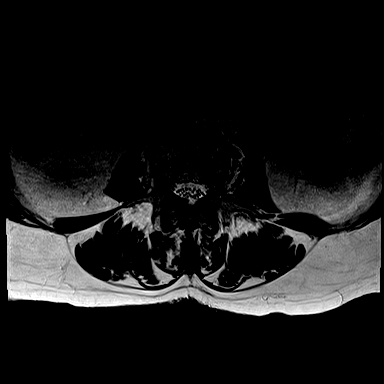
[im 11/19]
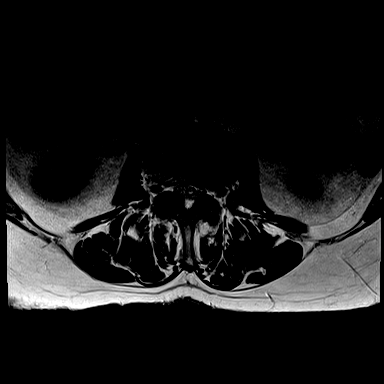
[im 13/19]
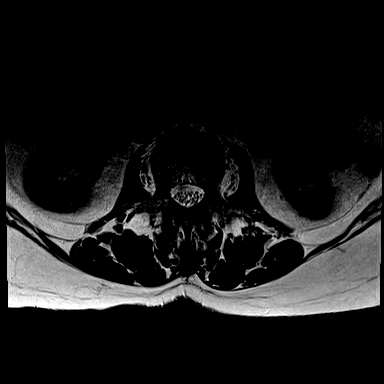
[im 16/19]
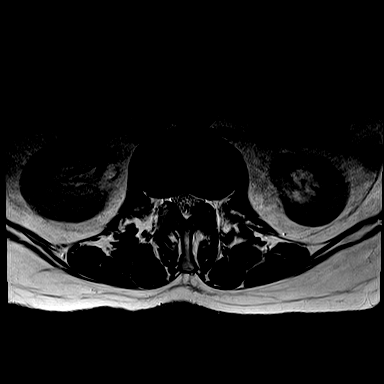
[im 19/19]
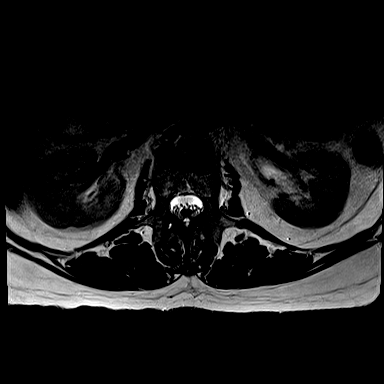

[Series 9: T2 · axial · 4.0mm · 0.57mm/px · z∈[-68,-17]mm · 5 of 12 slices shown (3 of 3)]
[im 1/12]
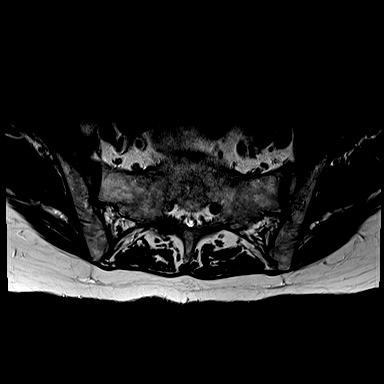
[im 3/12]
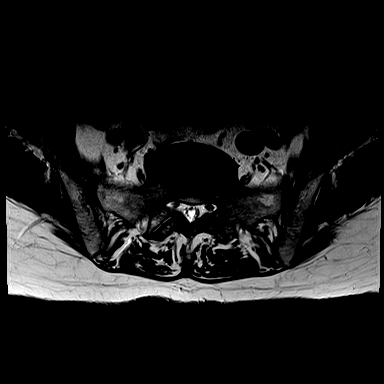
[im 6/12]
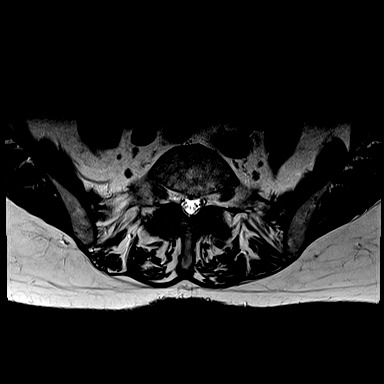
[im 9/12]
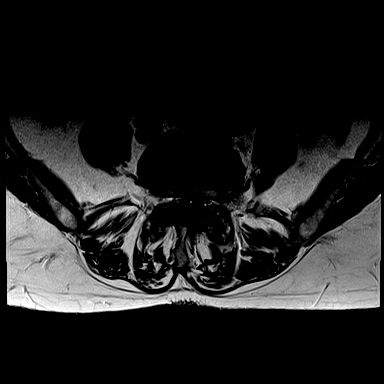
[im 12/12]
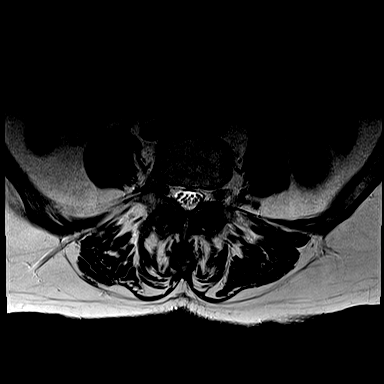

[Series 10: T1 · axial · 4.0mm · 0.34mm/px · z∈[+31,+56]mm · 3 of 19 slices shown (2 of 2)]
[im 1/19]
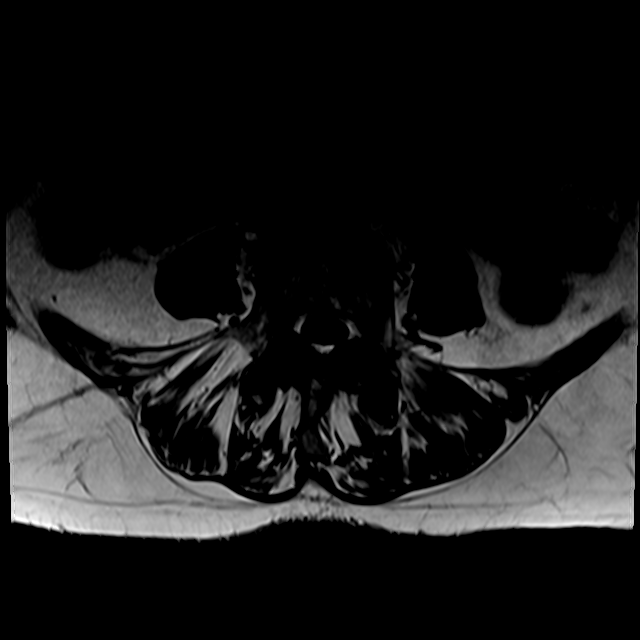
[im 3/19]
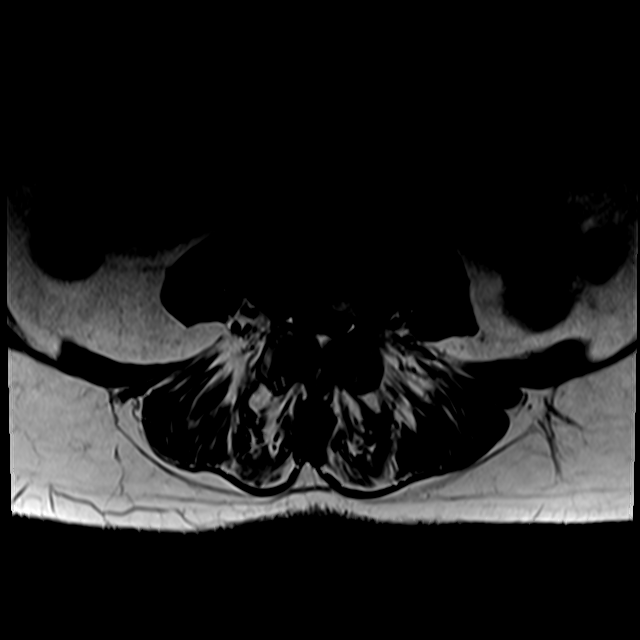
[im 6/19]
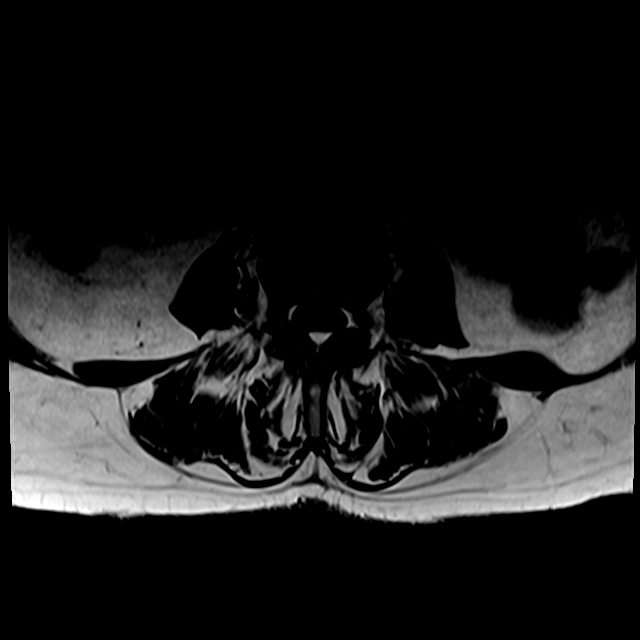

[31 of 48 positions shown; findings below may reference images not displayed]

FINDINGS: Segmentation:  Standard.

Alignment:  L4-5 grade 1 anterolisthesis.

Vertebrae: No fracture, evidence of discitis, or bone lesion. Mild
edema within the left-sided L4 and L5 pedicles, likely stress
reaction.

Conus medullaris and cauda equina: Conus extends to the L1 level.
Conus and cauda equina appear normal.

Paraspinal and other soft tissues: Negative.

Disc levels:

L1-2: Disc bulge and facet hypertrophy. Mild bilateral foraminal
stenosis. No significant canal stenosis.

L2-3: Disc bulge with facet hypertrophy and ligamentum flavum
hypertrophy. Mild bilateral foraminal stenosis. Moderate spinal
canal stenosis.

L3-4: Disc bulge with facet and ligamentum flavum hypertrophy. Mild
bilateral foraminal stenosis. Mild spinal canal stenosis.

L4-5: Anterolisthesis with small uncovered disc bulge as well as
severe facet and ligamentum flavum hypertrophy. Mild bilateral
foraminal stenosis. Severe spinal canal and lateral recess stenosis.

L5-S1: No significant disc displacement. Facet and ligamentum flavum
hypertrophy. Mild canal and lateral recess stenosis. No significant
foraminal stenosis.
IMPRESSION: 1. No acute fracture. L4-5 grade 1 anterolisthesis. Mild edema in
left L4-5 and L5 pedicles, likely stress reaction.
2. Lumbar spondylosis with predominant facet arthropathy greatest at
L4-5.
3. Severe L4-5 spinal canal stenosis. Multilevel mild and moderate
spinal canal stenosis.
4. Multilevel mild neural foraminal stenosis. No high-grade
foraminal stenosis.

## 2020-07-05 DIAGNOSIS — I480 Paroxysmal atrial fibrillation: Secondary | ICD-10-CM | POA: Diagnosis not present

## 2020-07-09 ENCOUNTER — Other Ambulatory Visit: Payer: Self-pay | Admitting: Internal Medicine

## 2020-07-09 NOTE — Telephone Encounter (Signed)
Pt called in requesting a 90 day refill of Eliquis. Pt last seen on 02/16/20 Medication last refilled on  05/17/20  Ok to refill for 180 tablets with 0 refills?

## 2020-07-09 NOTE — Addendum Note (Signed)
Addended by: Ezequiel Ganser on: 07/09/2020 04:18 PM   Modules accepted: Orders

## 2020-07-09 NOTE — Telephone Encounter (Signed)
Patient is requesting a 90 day refill on her   ELIQUIS 2.5 MG TABS tablet so she does not have to travel so much.

## 2020-07-10 MED ORDER — APIXABAN 2.5 MG PO TABS: ORAL_TABLET | ORAL | 1 refills | Status: DC

## 2020-07-10 MED ORDER — APIXABAN 2.5 MG PO TABS: ORAL_TABLET | ORAL | 2 refills | Status: DC

## 2020-07-10 NOTE — Addendum Note (Signed)
Addended by: Alisa Graff on: 07/10/2020 04:15 AM   Modules accepted: Orders

## 2020-07-10 NOTE — Telephone Encounter (Signed)
Rx accidentally sent in for 30 day supply.  Changed to 90 day supply with one refill.  Please notify pharmacy - want 90 day supply.  Also, make sure she has f/u appt scheduled.

## 2020-10-24 DIAGNOSIS — J449 Chronic obstructive pulmonary disease, unspecified: Secondary | ICD-10-CM | POA: Diagnosis not present

## 2020-10-24 DIAGNOSIS — J310 Chronic rhinitis: Secondary | ICD-10-CM | POA: Diagnosis not present

## 2020-12-05 DIAGNOSIS — H401133 Primary open-angle glaucoma, bilateral, severe stage: Secondary | ICD-10-CM | POA: Diagnosis not present

## 2020-12-05 DIAGNOSIS — H35372 Puckering of macula, left eye: Secondary | ICD-10-CM | POA: Diagnosis not present

## 2020-12-05 DIAGNOSIS — Z961 Presence of intraocular lens: Secondary | ICD-10-CM | POA: Diagnosis not present

## 2021-01-02 ENCOUNTER — Ambulatory Visit (INDEPENDENT_AMBULATORY_CARE_PROVIDER_SITE_OTHER): Payer: Medicare HMO | Admitting: Internal Medicine

## 2021-01-02 ENCOUNTER — Telehealth: Payer: Self-pay | Admitting: Internal Medicine

## 2021-01-02 ENCOUNTER — Other Ambulatory Visit: Payer: Self-pay

## 2021-01-02 VITALS — BP 138/78 | HR 75 | Temp 97.0°F | Resp 16 | Ht 61.0 in | Wt 126.0 lb

## 2021-01-02 DIAGNOSIS — H9190 Unspecified hearing loss, unspecified ear: Secondary | ICD-10-CM | POA: Diagnosis not present

## 2021-01-02 DIAGNOSIS — I4891 Unspecified atrial fibrillation: Secondary | ICD-10-CM

## 2021-01-02 DIAGNOSIS — R011 Cardiac murmur, unspecified: Secondary | ICD-10-CM | POA: Diagnosis not present

## 2021-01-02 DIAGNOSIS — R739 Hyperglycemia, unspecified: Secondary | ICD-10-CM

## 2021-01-02 DIAGNOSIS — E7800 Pure hypercholesterolemia, unspecified: Secondary | ICD-10-CM | POA: Diagnosis not present

## 2021-01-02 DIAGNOSIS — R0989 Other specified symptoms and signs involving the circulatory and respiratory systems: Secondary | ICD-10-CM | POA: Diagnosis not present

## 2021-01-02 DIAGNOSIS — I10 Essential (primary) hypertension: Secondary | ICD-10-CM

## 2021-01-02 DIAGNOSIS — Z0289 Encounter for other administrative examinations: Secondary | ICD-10-CM

## 2021-01-02 LAB — BASIC METABOLIC PANEL
BUN: 19 mg/dL (ref 6–23)
CO2: 26 mEq/L (ref 19–32)
Calcium: 9.4 mg/dL (ref 8.4–10.5)
Chloride: 105 mEq/L (ref 96–112)
Creatinine, Ser: 0.77 mg/dL (ref 0.40–1.20)
GFR: 70.36 mL/min (ref >60.00)
Glucose, Bld: 94 mg/dL (ref 70–99)
Potassium: 4.8 mEq/L (ref 3.5–5.1)
Sodium: 139 mEq/L (ref 135–145)

## 2021-01-02 LAB — CBC WITH DIFFERENTIAL/PLATELET
Basophils Absolute: 0.1 10*3/uL (ref 0.0–0.1)
Basophils Relative: 1.2 % (ref 0.0–3.0)
Eosinophils Absolute: 0.4 10*3/uL (ref 0.0–0.7)
Eosinophils Relative: 6.7 % — ABNORMAL HIGH (ref 0.0–5.0)
HCT: 36.7 % (ref 36.0–46.0)
Hemoglobin: 13.1 g/dL (ref 12.0–15.0)
Lymphocytes Relative: 33.7 % (ref 12.0–46.0)
Lymphs Abs: 2.2 10*3/uL (ref 0.7–4.0)
MCHC: 35.6 g/dL (ref 30.0–36.0)
MCV: 100.5 fl — ABNORMAL HIGH (ref 78.0–100.0)
Monocytes Absolute: 0.6 10*3/uL (ref 0.1–1.0)
Monocytes Relative: 8.8 % (ref 3.0–12.0)
Neutro Abs: 3.3 10*3/uL (ref 1.4–7.7)
Neutrophils Relative %: 49.6 % (ref 43.0–77.0)
Platelets: 291 10*3/uL (ref 150.0–400.0)
RBC: 3.65 Mil/uL — ABNORMAL LOW (ref 3.87–5.11)
RDW: 13.9 % (ref 11.5–15.5)
WBC: 6.6 10*3/uL (ref 4.0–10.5)

## 2021-01-02 LAB — HEMOGLOBIN A1C: Hgb A1c MFr Bld: 5.8 % (ref 4.6–6.5)

## 2021-01-02 LAB — LIPID PANEL
Cholesterol: 307 mg/dL — ABNORMAL HIGH (ref 0–200)
HDL: 82.5 mg/dL (ref >39.00)
LDL Cholesterol: 207 mg/dL — ABNORMAL HIGH (ref 0–99)
NonHDL: 224.27
Total CHOL/HDL Ratio: 4
Triglycerides: 88 mg/dL (ref 0.0–149.0)
VLDL: 17.6 mg/dL (ref 0.0–40.0)

## 2021-01-02 LAB — HEPATIC FUNCTION PANEL
ALT: 16 U/L (ref 0–35)
AST: 15 U/L (ref 0–37)
Albumin: 4.3 g/dL (ref 3.5–5.2)
Alkaline Phosphatase: 55 U/L (ref 39–117)
Bilirubin, Direct: 0.1 mg/dL (ref 0.0–0.3)
Total Bilirubin: 0.6 mg/dL (ref 0.2–1.2)
Total Protein: 6.8 g/dL (ref 6.0–8.3)

## 2021-01-02 LAB — TSH: TSH: 2.63 u[IU]/mL (ref 0.35–5.50)

## 2021-01-02 NOTE — Progress Notes (Signed)
Patient ID: Janeece Agee, female   DOB: 25-Jun-1933, 87 y.o.   MRN: [id number]   Subjective:    Patient ID: Janeece Agee, female    DOB: [birth year], 87 y.o.   MRN: [id number]  This visit occurred during the SARS-CoV-2 public health emergency.  Safety protocols were in place, including screening questions prior to the visit, additional usage of staff PPE, and extensive cleaning of exam room while observing appropriate contact time as indicated for disinfecting solutions.   Patient here for work in Psychologist, educational Complaint  Patient presents with   Form Completion   .   HPI Work in for form completion - insurance form.  She is very active.  No chest pain or sob reported.  Eating well.  No headache or dizziness.  No syncope, near syncope or light headedness.  Acid reflux.  No abdominal pain.  Bowels moving.  Does report some decreased hearing.  Agreeable to formal hearing evaluation.  No history of seizures.  No recent illnesses.  Previously hospitalized for pneumonia.  Cleared completely.  No residual problems.  No problems driving.    Past Medical History:  Diagnosis Date   Cancer ([place])    skin cancer nose   Complication of anesthesia    woke up during partial hysterectomy   Elevated blood pressure    GERD (gastroesophageal reflux disease)    occ-no meds   Headache    h/o    Heart murmur    asymptomatic   Past Surgical History:  Procedure Laterality Date   ABDOMINAL HYSTERECTOMY  1974   partial   BREAST SURGERY     IMPLANTS   CATARACT EXTRACTION W/PHACO Right 02/16/2018   Procedure: CATARACT EXTRACTION PHACO AND INTRAOCULAR LENS PLACEMENT ([place]) RIGHT;  Surgeon: Marchia Meiers, MD;  Location: [facility] OR[facility];  Service: Ophthalmology;  Laterality: Right;  [place]  01:10 CDE 11.02 Fluid pack lot # [id number]   CATARACT EXTRACTION W/PHACO Left 03/18/2018   Procedure: CATARACT EXTRACTION PHACO AND INTRAOCULAR LENS PLACEMENT ([place]) LEFT;  Surgeon: Marchia Meiers, MD;  Location: [facility];  Service:  Ophthalmology;  Laterality: Left;  [place] 01:06.5 CDE 10.80 FLUID PACK LOT # [id number]   LUMBAR LAMINECTOMY/ DECOMPRESSION WITH MET-RX Bilateral 09/20/2018   Procedure: LUMBAR LAMINECTOMY/ DECOMPRESSION WITH MET-RX;  Surgeon: Deetta Perla, MD;  Location: [facility];  Service: Neurosurgery;  Laterality: Bilateral;   TONSILLECTOMY AND ADENOIDECTOMY  60   Family History  Problem Relation Age of Onset   Stroke Mother    Hypertension Mother    Stroke Father    Hypertension Father    Diabetes Other    Social History   Socioeconomic History   Marital status: Married    Spouse name: Not on file   Number of children: Not on file   Years of education: Not on file   Highest education level: Not on file  Occupational History   Not on file  Tobacco Use   Smoking status: Never   Smokeless tobacco: Never  Vaping Use   Vaping Use: Never used  Substance and Sexual Activity   Alcohol use: No    Alcohol/week: 0.0 standard drinks   Drug use: No   Sexual activity: Not on file  Other Topics Concern   Not on file  Social History Narrative   Not on file   Social Determinants of Health   Financial Resource Strain: Not on file  Food Insecurity: Not on file  Transportation Needs: Not on file  Physical Activity: Not on file  Stress: Not on file  Social Connections: Not on file     Review of Systems  Constitutional:  Negative for appetite change and unexpected weight change.  HENT:  Negative for congestion and sinus pressure.   Respiratory:  Negative for cough, chest tightness and shortness of breath.   Cardiovascular:  Negative for chest pain, palpitations and leg swelling.  Gastrointestinal:  Negative for abdominal pain, diarrhea, nausea and vomiting.  Genitourinary:  Negative for difficulty urinating and dysuria.  Musculoskeletal:  Negative for joint swelling and myalgias.  Skin:  Negative for color change and rash.  Neurological:  Negative for dizziness, light-headedness and headaches.   Psychiatric/Behavioral:  Negative for agitation and dysphoric mood.       Objective:     BP 138/78    Pulse 75    Temp (!) 97 F (36.1 C)    Resp 16    Wt 126 lb (57.2 kg)    SpO2 99%    BMI 23.81 kg/m  Wt Readings from Last 3 Encounters:  01/02/21 126 lb (57.2 kg)  05/17/20 118 lb 12.8 oz (53.9 kg)  02/13/20 120 lb (54.4 kg)    Physical Exam Vitals reviewed.  Constitutional:      General: She is not in acute distress.    Appearance: Normal appearance.  HENT:     Head: Normocephalic and atraumatic.     Right Ear: External ear normal.     Left Ear: External ear normal.  Eyes:     General: No scleral icterus.       Right eye: No discharge.        Left eye: No discharge.     Conjunctiva/sclera: Conjunctivae normal.  Neck:     Thyroid: No thyromegaly.  Cardiovascular:     Rate and Rhythm: Normal rate and regular rhythm.  Pulmonary:     Effort: No respiratory distress.     Breath sounds: Normal breath sounds. No wheezing.  Abdominal:     General: Bowel sounds are normal.     Palpations: Abdomen is soft.     Tenderness: There is no abdominal tenderness.  Musculoskeletal:        General: No swelling or tenderness.     Cervical back: Neck supple. No tenderness.  Lymphadenopathy:     Cervical: No cervical adenopathy.  Skin:    Findings: No erythema or rash.  Neurological:     Mental Status: She is alert.  Psychiatric:        Mood and Affect: Mood normal.        Behavior: Behavior normal.     Outpatient Encounter Medications as of 01/02/2021  Medication Sig   apixaban (ELIQUIS) 2.5 MG TABS tablet TAKE ONE (1) TABLET BY MOUTH TWO TIMES PER DAY   Coenzyme Q10 (COQ10) 100 MG CAPS Take 100 mg by mouth daily.    dorzolamidel-timolol (COSOPT) 22.3-6.8 MG/ML SOLN ophthalmic solution Place 1 drop into both eyes 2 (two) times daily.   latanoprost (XALATAN) 0.005 % ophthalmic solution Place 1 drop into both eyes at bedtime.   Multiple Vitamins-Minerals (MULTIVITAMIN WITH  MINERALS) tablet Take 1 tablet by mouth daily.    ROCKLATAN 0.02-0.005 % SOLN SMARTSIG:1 Drop(s) In Eye(s) Every Evening   vitamin C (ASCORBIC ACID) 500 MG tablet Take 500 mg by mouth daily.    [DISCONTINUED] BREO ELLIPTA 100-25 MCG/INH AEPB SMARTSIG:1 Inhalation Via Inhaler Daily   [DISCONTINUED] fluticasone (FLOVENT HFA) 110 MCG/ACT inhaler    No facility-administered encounter medications on file as of  01/02/2021.     Lab Results  Component Value Date   WBC 6.6 01/02/2021   HGB 13.1 01/02/2021   HCT 36.7 01/02/2021   PLT 291.0 01/02/2021   GLUCOSE 94 01/02/2021   CHOL 307 (H) 01/02/2021   TRIG 88.0 01/02/2021   HDL 82.50 01/02/2021   LDLDIRECT 192.0 02/13/2020   LDLCALC 207 (H) 01/02/2021   ALT 16 01/02/2021   AST 15 01/02/2021   NA 139 01/02/2021   K 4.8 01/02/2021   CL 105 01/02/2021   CREATININE 0.77 01/02/2021   BUN 19 01/02/2021   CO2 26 01/02/2021   TSH 2.63 01/02/2021   INR 1.1 01/17/2020   HGBA1C 5.8 01/02/2021    DG Chest Port 1 View  Result Date: 01/19/2020 CLINICAL DATA:  Dyspnea EXAM: PORTABLE CHEST 1 VIEW COMPARISON:  01/17/2020 FINDINGS: Lung volumes are small, but are symmetric and pulmonary insufflation remain stable. Superimposed multifocal pulmonary infiltrate is again identified and has progressed, particularly within the right upper lobe. No pneumothorax or pleural effusion. Cardiac size within normal limits. Bilateral breast implants noted. No acute bone abnormality. IMPRESSION: Progressive multifocal pulmonary infiltrates, more focal within the right upper lobe, in keeping with changes of atypical infection in the acute setting. Electronically Signed   By: Fidela Salisbury MD   On: 01/19/2020 05:58   ECHOCARDIOGRAM COMPLETE  Result Date: 01/18/2020    ECHOCARDIOGRAM REPORT   Patient Name:   ATONYA TEMPLER Date of Exam: 01/18/2020 Medical Rec #:  [id number]    Height:       61.0 in Accession #:    [id number]   Weight:       120.0 lb Date of Birth:   31-Oct-1933    BSA:          1.520 m Patient Age:    77 years     BP:           126/60 mmHg Patient Gender: F            HR:           80 bpm. Exam Location:  [facility] Procedure: 2D Echo, Color Doppler, Cardiac Doppler and Strain Analysis Indications:     I48.91 Atrial fibrillation  History:         Patient has no prior history of Echocardiogram examinations.  Sonographer:     Charmayne Sheer RDCS (AE) Referring Phys:  [id number] Arvella Merles [place] Diagnosing Phys: Bartholome Bill MD  Sonographer Comments: Suboptimal parasternal window. Image acquisition challenging due to breast implants. Global longitudinal strain was attempted. IMPRESSIONS  1. Left ventricular ejection fraction, by estimation, is 55 to 60%. The left ventricle has normal function. The left ventricle has no regional wall motion abnormalities. There is moderate left ventricular hypertrophy. Left ventricular diastolic parameters are consistent with Grade I diastolic dysfunction (impaired relaxation).  2. Right ventricular systolic function is normal. The right ventricular size is normal.  3. The mitral valve is grossly normal. Trivial mitral valve regurgitation.  4. The aortic valve is grossly normal. Aortic valve regurgitation is trivial. FINDINGS  Left Ventricle: Left ventricular ejection fraction, by estimation, is 55 to 60%. The left ventricle has normal function. The left ventricle has no regional wall motion abnormalities. The left ventricular internal cavity size was normal in size. There is  moderate left ventricular hypertrophy. Left ventricular diastolic parameters are consistent with Grade I diastolic dysfunction (impaired relaxation). Right Ventricle: The right ventricular size is normal. No increase in right ventricular wall thickness. Right ventricular  systolic function is normal. Left Atrium: Left atrial size was normal in size. Right Atrium: Right atrial size was normal in size. Pericardium: There is no evidence of pericardial effusion. Mitral Valve: The  mitral valve is grossly normal. Trivial mitral valve regurgitation. MV peak gradient, 6.2 mmHg. The mean mitral valve gradient is 3.0 mmHg. Tricuspid Valve: The tricuspid valve is grossly normal. Tricuspid valve regurgitation is trivial. Aortic Valve: The aortic valve is grossly normal. Aortic valve regurgitation is trivial. Aortic valve mean gradient measures 9.0 mmHg. Aortic valve peak gradient measures 14.7 mmHg. Aortic valve area, by VTI measures 1.18 cm. Pulmonic Valve: The pulmonic valve was not assessed. Pulmonic valve regurgitation is not visualized. Aorta: The aortic root is normal in size and structure. IAS/Shunts: The interatrial septum was not assessed.  LEFT VENTRICLE PLAX 2D LVIDd:         4.07 cm  Diastology LVIDs:         2.85 cm  LV e' medial:    5.33 cm/s LV PW:         1.05 cm  LV E/e' medial:  17.3 LV IVS:        0.88 cm  LV e' lateral:   6.53 cm/s LVOT diam:     1.90 cm  LV E/e' lateral: 14.1 LV SV:         46 LV SV Index:   31 LVOT Area:     2.84 cm  RIGHT VENTRICLE RV Basal diam:  2.52 cm LEFT ATRIUM             Index       RIGHT ATRIUM           Index LA Vol (A2C):   24.7 ml 16.25 ml/m RA Area:     10.90 cm LA Vol (A4C):   25.8 ml 16.97 ml/m RA Volume:   26.80 ml  17.63 ml/m LA Biplane Vol: 27.1 ml 17.83 ml/m  AORTIC VALVE                    PULMONIC VALVE AV Area (Vmax):    1.31 cm     PV Vmax:       0.85 m/s AV Area (Vmean):   1.32 cm     PV Vmean:      60.100 cm/s AV Area (VTI):     1.18 cm     PV VTI:        0.150 m AV Vmax:           192.00 cm/s  PV Peak grad:  2.9 mmHg AV Vmean:          137.000 cm/s PV Mean grad:  2.0 mmHg AV VTI:            0.395 m AV Peak Grad:      14.7 mmHg AV Mean Grad:      9.0 mmHg LVOT Vmax:         88.80 cm/s LVOT Vmean:        63.800 cm/s LVOT VTI:          0.164 m LVOT/AV VTI ratio: 0.42  AORTA Ao Root diam: 2.60 cm MITRAL VALVE MV Area (PHT): 4.19 cm     SHUNTS MV Peak grad:  6.2 mmHg     Systemic VTI:  0.16 m MV Mean grad:  3.0 mmHg     Systemic  Diam: 1.90 cm MV Vmax:       1.25 m/s MV Vmean:  77.4 cm/s MV Decel Time: 181 msec MV E velocity: 92.10 cm/s MV A velocity: 131.00 cm/s MV E/A ratio:  0.70 Bartholome Bill MD Electronically signed by Bartholome Bill MD Signature Date/Time: 01/18/2020/4:55:43 PM    Final        Assessment & Plan:   Problem List Items Addressed This Visit     A-fib [facility])    Denies any increased heart rate or palpitations.  Continues on eliquis.  No chest pain or sob.        Decreased hearing    Some decreased hearing noted.  Refer to ENT for form hearing evaluation.        Encounter for completion of form with patient    Form completed.        Essential hypertension    Blood pressure as outlined.  States under good control on outside checks.  Follow pressures.  Hold on medication.        Relevant Orders   CBC with Differential/Platelet (Completed)   Basic metabolic panel (Completed)   TSH (Completed)   Heart murmur    Had echo - no significant valve abnormality.  Currently asymptomatic.        Hypercholesterolemia - Primary    Have discussed recommendation to start cholesterol medication.  She declines.  Low cholesterol diet and exercise.  Follow lipid panel.       Relevant Orders   Hepatic function panel (Completed)   Lipid panel (Completed)   Hyperglycemia    Low carb diet and exercise.  Follow met b and a1c.       Relevant Orders   Hemoglobin A1c (Completed)   Right carotid bruit    Noticed right carotid bruit previously on exam.  Has never had carotid ultrasound.  Schedule carotid ultrasound.          Einar Pheasant, MD

## 2021-01-02 NOTE — Telephone Encounter (Signed)
Kathleen Wallace called in stating that she was here in the office today and Dr. Nicki Reaper advise Kathleen Wallace that she needs to be setup for an hearing test. Kathleen Wallace stated that she looked at her After visit summary and there was nothing on the paperwork about her hearing test. Kathleen Wallace was wondering what do she need to do. Kathleen Wallace requesting callback

## 2021-01-03 ENCOUNTER — Other Ambulatory Visit: Payer: Self-pay | Admitting: Internal Medicine

## 2021-01-03 ENCOUNTER — Other Ambulatory Visit (INDEPENDENT_AMBULATORY_CARE_PROVIDER_SITE_OTHER): Payer: Medicare HMO

## 2021-01-03 DIAGNOSIS — D7589 Other specified diseases of blood and blood-forming organs: Secondary | ICD-10-CM

## 2021-01-03 LAB — VITAMIN B12: Vitamin B-12: 332 pg/mL (ref 211–911)

## 2021-01-03 NOTE — Progress Notes (Signed)
Order placed for add on B12 level.

## 2021-01-03 NOTE — Telephone Encounter (Signed)
ENT referral placed.

## 2021-01-03 NOTE — Telephone Encounter (Signed)
Yes.  Needs ENT referral for hearing evaluation.  Diagnosis is hearing loss.  Notify her that order has to be placed and someone will contact her with appt.

## 2021-01-03 NOTE — Telephone Encounter (Signed)
Are we referring her to ENT?

## 2021-01-06 ENCOUNTER — Encounter: Payer: Self-pay | Admitting: Internal Medicine

## 2021-01-06 DIAGNOSIS — Z0289 Encounter for other administrative examinations: Secondary | ICD-10-CM | POA: Insufficient documentation

## 2021-01-06 DIAGNOSIS — H9190 Unspecified hearing loss, unspecified ear: Secondary | ICD-10-CM | POA: Insufficient documentation

## 2021-01-06 NOTE — Assessment & Plan Note (Signed)
Had echo - no significant valve abnormality.  Currently asymptomatic.   

## 2021-01-06 NOTE — Assessment & Plan Note (Signed)
Form completed.

## 2021-01-06 NOTE — Assessment & Plan Note (Signed)
Blood pressure as outlined.  States under good control on outside checks.  Follow pressures.  Hold on medication.

## 2021-01-06 NOTE — Assessment & Plan Note (Signed)
Have discussed recommendation to start cholesterol medication.  She declines.  Low cholesterol diet and exercise.  Follow lipid panel.  

## 2021-01-06 NOTE — Assessment & Plan Note (Signed)
Denies any increased heart rate or palpitations.  Continues on eliquis.  No chest pain or sob.   

## 2021-01-06 NOTE — Assessment & Plan Note (Signed)
Noticed right carotid bruit previously on exam.  Has never had carotid ultrasound.  Schedule carotid ultrasound.

## 2021-01-06 NOTE — Assessment & Plan Note (Signed)
Some decreased hearing noted.  Refer to ENT for form hearing evaluation.

## 2021-01-06 NOTE — Assessment & Plan Note (Signed)
Low carb diet and exercise.  Follow met b and a1c.

## 2021-01-07 ENCOUNTER — Ambulatory Visit (INDEPENDENT_AMBULATORY_CARE_PROVIDER_SITE_OTHER): Payer: Medicare HMO

## 2021-01-07 VITALS — Ht 61.0 in | Wt 126.0 lb

## 2021-01-07 DIAGNOSIS — Z0000 Encounter for general adult medical examination without abnormal findings: Secondary | ICD-10-CM | POA: Diagnosis not present

## 2021-01-07 NOTE — Progress Notes (Addendum)
Subjective:   Kathleen Wallace is a 87 y.o. female who presents for Medicare Annual (Subsequent) preventive examination.  Review of Systems    No ROS.  Medicare Wellness Virtual Visit.  Visual/audio telehealth visit, UTA vital signs.   See social history for additional risk factors.   Cardiac Risk Factors include: advanced age (>104mn, >>64women)     Objective:    Today's Vitals   01/07/21 0950  Weight: 126 lb (57.2 kg)  Height: '5\' 1"'  (1.549 m)   Body mass index is 23.81 kg/m.  Advanced Directives 01/07/2021 01/17/2020 01/17/2020 01/05/2020 12/23/2019 12/19/2019 09/05/2019  Does Patient Have a Medical Advance Directive? Yes No No Yes No Yes No  Type of AParamed[place]of [place] will [place] - [place] - Living will -  Does patient want to make changes to medical advance directive? No - Patient declined No - Patient declined - No - Patient declined - No - Patient declined -  Copy of HRuddin Chart? Yes - validated most recent copy scanned in chart (See row information) Yes - validated most recent copy scanned in chart (See row information) - Yes - validated most recent copy scanned in chart (See row information) - - -  Would patient like information on creating a medical advance directive? - No - Patient declined - - - - No - Patient declined    Current Medications (verified) Outpatient Encounter Medications as of 01/07/2021  Medication Sig   apixaban (ELIQUIS) 2.5 MG TABS tablet TAKE ONE (1) TABLET BY MOUTH TWO TIMES PER DAY   Coenzyme Q10 (COQ10) 100 MG CAPS Take 100 mg by mouth daily.    dorzolamidel-timolol (COSOPT) 22.3-6.8 MG/ML SOLN ophthalmic solution Place 1 drop into both eyes 2 (two) times daily.   latanoprost (XALATAN) 0.005 % ophthalmic solution Place 1 drop into both eyes at bedtime.   Multiple Vitamins-Minerals (MULTIVITAMIN WITH MINERALS) tablet Take 1  tablet by mouth daily.    ROCKLATAN 0.02-0.005 % SOLN SMARTSIG:1 Drop(s) In Eye(s) Every Evening   vitamin C (ASCORBIC ACID) 500 MG tablet Take 500 mg by mouth daily.    No facility-administered encounter medications on file as of 01/07/2021.    Allergies (verified) Other, Codeine, and Amoxicillin   History: Past Medical History:  Diagnosis Date   Cancer ([place]    skin cancer nose   Complication of anesthesia    woke up during partial hysterectomy   Elevated blood pressure    GERD (gastroesophageal reflux disease)    occ-no meds   Headache    h/o    Heart murmur    asymptomatic   Past Surgical History:  Procedure Laterality Date   ABDOMINAL HYSTERECTOMY  1974   partial   BREAST SURGERY     IMPLANTS   CATARACT EXTRACTION W/PHACO Right 02/16/2018   Procedure: CATARACT EXTRACTION PHACO AND INTRAOCULAR LENS PLACEMENT ([place];  Surgeon: HMarchia Meiers MD;  Location: [facility] ORS;  Service: Ophthalmology;  Laterality: Right;  [place] 01:10 CDE 11.02 Fluid pack lot # [id number]   CATARACT EXTRACTION W/PHACO Left 03/18/2018   Procedure: CATARACT EXTRACTION PHACO AND INTRAOCULAR LENS PLACEMENT ([place] LEFT;  Surgeon: HMarchia Meiers MD;  Location: [facility] ORS;  Service: Ophthalmology;  Laterality: Left;  [place]01:06.5 CDE 10.80 FLUID PACK LOT # [id number]   LUMBAR LAMINECTOMY/ DECOMPRESSION WITH MET-RX Bilateral 09/20/2018   Procedure: LUMBAR LAMINECTOMY/ DECOMPRESSION WITH MET-RX;  Surgeon: CDeetta Perla MD;  Location:  [facility] ORS;  Service: Neurosurgery;  Laterality: Bilateral;   TONSILLECTOMY AND ADENOIDECTOMY  1944   Family History  Problem Relation Age of Onset   Stroke Mother    Hypertension Mother    Stroke Father    Hypertension Father    Diabetes Other    Social History   Socioeconomic History   Marital status: Married    Spouse name: Not on file   Number of children: Not on file   Years of education: Not on file   Highest education level: Not on file  Occupational History   Not  on file  Tobacco Use   Smoking status: Never   Smokeless tobacco: Never  Vaping Use   Vaping Use: Never used  Substance and Sexual Activity   Alcohol use: No    Alcohol/week: 0.0 standard drinks   Drug use: No   Sexual activity: Not on file  Other Topics Concern   Not on file  Social History Narrative   Not on file   Social Determinants of Health   Financial Resource Strain: Low Risk    Difficulty of Paying Living Expenses: Not hard at all  Food Insecurity: No Food Insecurity   Worried About Charity fundraiser in the Last Year: Never true   [place] in the Last Year: Never true  Transportation Needs: No Transportation Needs   Lack of Transportation (Medical): No   Lack of Transportation (Non-Medical): No  Physical Activity: Not on file  Stress: No Stress Concern Present   Feeling of Stress : Not at all  Social Connections: Unknown   Frequency of Communication with Friends and Family: Not on file   Frequency of Social Gatherings with Friends and Family: Not on file   Attends Religious Services: Not on file   Active Member of Clubs or Organizations: Not on file   Attends Archivist Meetings: Not on file   Marital Status: Married    Tobacco Counseling Counseling given: Not Answered   Clinical Intake:  Pre-visit preparation completed: Yes        Diabetes: No  How often do you need to have someone help you when you read instructions, pamphlets, or other written materials from your doctor or pharmacy?: 1 - Never   Interpreter Needed?: No      Activities of Daily Living In your present state of health, do you have any difficulty performing the following activities: 01/07/2021 01/17/2020  Hearing? N Y  Vision? N N  Difficulty concentrating or making decisions? N N  Walking or climbing stairs? N N  Dressing or bathing? N N  Doing errands, shopping? N N  Preparing Food and eating ? N -  Using the Toilet? N -  In the past six months, have  you accidently leaked urine? N -  Do you have problems with loss of bowel control? N -  Managing your Medications? N -  Managing your Finances? N -  Housekeeping or managing your Housekeeping? N -  Some recent data might be hidden   Patient Care Team: Einar Pheasant, MD as PCP - General (Internal Medicine)  Indicate any recent Medical Services you may have received from other than [facility] providers in the past year (date may be approximate).     Assessment:   This is a routine wellness examination for [facility].  Virtual Visit via Telephone Note  I connected with  Kathleen Wallace on 01/07/21 at  9:45 AM EST by telephone and verified that  I am speaking with the correct person using two identifiers.  Persons participating in the virtual visit: patient/Nurse Health Advisor   I discussed the limitations, risks, security and privacy concerns of performing an evaluation and management service by telephone and the availability of in person appointments. The patient expressed understanding and agreed to proceed.  Interactive audio and video telecommunications were attempted between this nurse and patient, however failed, due to patient having technical difficulties OR patient did not have access to video capability.  We continued and completed visit with audio only.  Some vital signs may be absent or patient reported.   Hearing/Vision screen Hearing Screening - Comments:: Patient is able to hear conversational tones without difficulty. Notes she plans to have baseline testing with audiology last OV with pcp.  Vision Screening - Comments:: Wears corrective lenses They have seen their ophthalmologist in the last 6 months. Glaucoma; drops in use.   Dietary issues and exercise activities discussed:   Regular diet Good fluid intake   Goals Addressed               This Visit's Progress     Patient Stated     DIET - INCREASE WATER INTAKE (pt-stated)        Stay hydrated       Depression  Screen PHQ 2/9 Scores 01/07/2021 01/02/2021 01/05/2020 10/04/2019 01/04/2019 12/31/2017 02/06/2017  PHQ - 2 Score 0 0 0 0 0 0 0    Fall Risk Fall Risk  01/07/2021 01/02/2021 01/05/2020 10/04/2019 01/04/2019  Falls in the past year? 0 0 0 0 0  Number falls in past yr: 0 0 0 0 -  Injury with Fall? - 0 0 0 -  Risk for fall due to : - No Fall Risks - - -  Follow up Falls evaluation completed Falls evaluation completed Falls evaluation completed Falls evaluation completed Falls prevention discussed    FALL RISK PREVENTION PERTAINING TO THE HOME: Home free of loose throw rugs in walkways, pet beds, electrical cords, etc? Yes  Adequate lighting in your home to reduce risk of falls? Yes   ASSISTIVE DEVICES UTILIZED TO PREVENT FALLS: Life alert? No  Use of a cane, walker or w/c? No   TIMED UP AND GO: Was the test performed? No .   Cognitive Function:  Patient is alert and oriented x3.  Manages her own finances and medications.    6CIT Screen 01/05/2020 01/04/2019 12/31/2017  What Year? 0 points 0 points 0 points  What month? 0 points 0 points 0 points  What time? 0 points 0 points 0 points  Count back from 20 0 points 0 points 0 points  Months in reverse 0 points 0 points 0 points  Repeat phrase 0 points 0 points 0 points  Total Score 0 0 0   Immunizations Immunization History  Administered Date(s) Administered   Moderna Sars-Covid-2 Vaccination 03/12/2019, 04/13/2019   Screening Tests Health Maintenance  Topic Date Due   Zoster Vaccines- Shingrix (1 of 2) 04/02/2021 (Originally 08/18/1952)   INFLUENZA VACCINE  04/19/2021 (Originally 08/20/2020)   Pneumonia Vaccine 89+ Years old (1 - PCV) 01/02/2022 (Originally 08/17/1939)   HPV VACCINES  Aged Out   DEXA SCAN  Discontinued   TETANUS/TDAP  Discontinued   COVID-19 Vaccine  Discontinued   Health Maintenance There are no preventive care reminders to display for this patient.  Mammogram status: No longer required due to declined,  aged out.  Lung Cancer Screening: (Low Dose CT Chest recommended if  Age 38-80 years, 30 pack-year currently smoking OR have quit w/in 15years.) does not qualify.   Hepatitis C Screening: does not qualify  Vision Screening: Recommended annual ophthalmology exams for early detection of glaucoma and other disorders of the eye.  Dental Screening: Recommended annual dental exams for proper oral hygiene  Community Resource Referral / Chronic Care Management: CRR required this visit?  No   CCM required this visit?  No      Plan:   Keep all routine maintenance appointments.   I have personally reviewed and noted the following in the patients chart:   Medical and social history Use of alcohol, tobacco or illicit drugs  Current medications and supplements including opioid prescriptions. Not taking opioid.  Functional ability and status Nutritional status Physical activity Advanced directives List of other physicians Hospitalizations, surgeries, and ER visits in previous 12 months Vitals Screenings to include cognitive, depression, and falls Referrals and appointments  In addition, I have reviewed and discussed with patient certain preventive protocols, quality metrics, and best practice recommendations. A written personalized care plan for preventive services as well as general preventive health recommendations were provided to patient.     Varney Biles, LPN   17/[id number]/[id number]    Reviewed information. Agree with plan.   Dr Nicki Reaper

## 2021-01-07 NOTE — Patient Instructions (Addendum)
Kathleen Wallace , Thank you for taking time to come for your Medicare Wellness Visit. I appreciate your ongoing commitment to your health goals. Please review the following plan we discussed and let me know if I can assist you in the future.   These are the goals we discussed:  Goals       Patient Stated     DIET - INCREASE WATER INTAKE (pt-stated)      Stay hydrated      Weight (lb) < 124 lb (56.2 kg) (pt-stated)      Maintain weight at 120lb Monitor sugar intake        This is a list of the screening recommended for you and due dates:  Health Maintenance  Topic Date Due   Zoster (Shingles) Vaccine (1 of 2) 04/02/2021*   Flu Shot  04/19/2021*   Pneumonia Vaccine (1 - PCV) 01/02/2022*   HPV Vaccine  Aged Out   DEXA scan (bone density measurement)  Discontinued   Tetanus Vaccine  Discontinued   COVID-19 Vaccine  Discontinued  *Topic was postponed. The date shown is not the original due date.    Advanced directives: on file  Conditions/risks identified: none new  Follow up in one year for your annual wellness visit    Preventive Care 65 Years and Older, Female Preventive care refers to lifestyle choices and visits with your health care provider that can promote health and wellness. What does preventive care include? A yearly physical exam. This is also called an annual well check. Dental exams once or twice a year. Routine eye exams. Ask your health care provider how often you should have your eyes checked. Personal lifestyle choices, including: Daily care of your teeth and gums. Regular physical activity. Eating a healthy diet. Avoiding tobacco and drug use. Limiting alcohol use. Practicing safe sex. Taking low-dose aspirin every day. Taking vitamin and mineral supplements as recommended by your health care provider. What happens during an annual well check? The services and screenings done by your health care provider during your annual well check will depend on your  age, overall health, lifestyle risk factors, and family history of disease. Counseling  Your health care provider may ask you questions about your: Alcohol use. Tobacco use. Drug use. Emotional well-being. Home and relationship well-being. Sexual activity. Eating habits. History of falls. Memory and ability to understand (cognition). Work and work Statistician. Reproductive health. Screening  You may have the following tests or measurements: Height, weight, and BMI. Blood pressure. Lipid and cholesterol levels. These may be checked every 5 years, or more frequently if you are over 44 years old. Skin check. Lung cancer screening. You may have this screening every year starting at age 56 if you have a 30-pack-year history of smoking and currently smoke or have quit within the past 15 years. Fecal occult blood test (FOBT) of the stool. You may have this test every year starting at age 25. Flexible sigmoidoscopy or colonoscopy. You may have a sigmoidoscopy every 5 years or a colonoscopy every 10 years starting at age 87. Hepatitis C blood test. Hepatitis B blood test. Sexually transmitted disease (STD) testing. Diabetes screening. This is done by checking your blood sugar (glucose) after you have not eaten for a while (fasting). You may have this done every 1-3 years. Bone density scan. This is done to screen for osteoporosis. You may have this done starting at age 74. Mammogram. This may be done every 1-2 years. Talk to your health care provider  about how often you should have regular mammograms. Talk with your health care provider about your test results, treatment options, and if necessary, the need for more tests. Vaccines  Your health care provider may recommend certain vaccines, such as: Influenza vaccine. This is recommended every year. Tetanus, diphtheria, and acellular pertussis (Tdap, Td) vaccine. You may need a Td booster every 10 years. Zoster vaccine. You may need this after  age 62. Pneumococcal 13-valent conjugate (PCV13) vaccine. One dose is recommended after age 37. Pneumococcal polysaccharide (PPSV23) vaccine. One dose is recommended after age 2. Talk to your health care provider about which screenings and vaccines you need and how often you need them. This information is not intended to replace advice given to you by your health care provider. Make sure you discuss any questions you have with your health care provider. Document Released: 02/02/2015 Document Revised: 09/26/2015 Document Reviewed: 11/07/2014 Elsevier Interactive Patient Education  2017 [place] Prevention in the Home Falls can cause injuries. They can happen to people of all ages. There are many things you can do to make your home safe and to help prevent falls. What can I do on the outside of my home? Regularly fix the edges of walkways and driveways and fix any cracks. Remove anything that might make you trip as you walk through a door, such as a raised step or threshold. Trim any bushes or trees on the path to your home. Use bright outdoor lighting. Clear any walking paths of anything that might make someone trip, such as rocks or tools. Regularly check to see if handrails are loose or broken. Make sure that both sides of any steps have handrails. Any raised decks and porches should have guardrails on the edges. Have any leaves, snow, or ice cleared regularly. Use sand or salt on walking paths during winter. Clean up any spills in your garage right away. This includes oil or grease spills. What can I do in the bathroom? Use night lights. Install grab bars by the toilet and in the tub and shower. Do not use towel bars as grab bars. Use non-skid mats or decals in the tub or shower. If you need to sit down in the shower, use a plastic, non-slip stool. Keep the floor dry. Clean up any water that spills on the floor as soon as it happens. Remove soap buildup in the tub or shower  regularly. Attach bath mats securely with double-sided non-slip rug tape. Do not have throw rugs and other things on the floor that can make you trip. What can I do in the bedroom? Use night lights. Make sure that you have a light by your bed that is easy to reach. Do not use any sheets or blankets that are too big for your bed. They should not hang down onto the floor. Have a firm chair that has side arms. You can use this for support while you get dressed. Do not have throw rugs and other things on the floor that can make you trip. What can I do in the kitchen? Clean up any spills right away. Avoid walking on wet floors. Keep items that you use a lot in easy-to-reach places. If you need to reach something above you, use a strong step stool that has a grab bar. Keep electrical cords out of the way. Do not use floor polish or wax that makes floors slippery. If you must use wax, use non-skid floor wax. Do not have throw rugs and  other things on the floor that can make you trip. What can I do with my stairs? Do not leave any items on the stairs. Make sure that there are handrails on both sides of the stairs and use them. Fix handrails that are broken or loose. Make sure that handrails are as long as the stairways. Check any carpeting to make sure that it is firmly attached to the stairs. Fix any carpet that is loose or worn. Avoid having throw rugs at the top or bottom of the stairs. If you do have throw rugs, attach them to the floor with carpet tape. Make sure that you have a light switch at the top of the stairs and the bottom of the stairs. If you do not have them, ask someone to add them for you. What else can I do to help prevent falls? Wear shoes that: Do not have high heels. Have rubber bottoms. Are comfortable and fit you well. Are closed at the toe. Do not wear sandals. If you use a stepladder: Make sure that it is fully opened. Do not climb a closed stepladder. Make sure that  both sides of the stepladder are locked into place. Ask someone to hold it for you, if possible. Clearly mark and make sure that you can see: Any grab bars or handrails. First and last steps. Where the edge of each step is. Use tools that help you move around (mobility aids) if they are needed. These include: Canes. Walkers. Scooters. Crutches. Turn on the lights when you go into a dark area. Replace any light bulbs as soon as they burn out. Set up your furniture so you have a clear path. Avoid moving your furniture around. If any of your floors are uneven, fix them. If there are any pets around you, be aware of where they are. Review your medicines with your doctor. Some medicines can make you feel dizzy. This can increase your chance of falling. Ask your doctor what other things that you can do to help prevent falls. This information is not intended to replace advice given to you by your health care provider. Make sure you discuss any questions you have with your health care provider. Document Released: 11/02/2008 Document Revised: 06/14/2015 Document Reviewed: 02/10/2014 Elsevier Interactive Patient Education  2017 [employer].

## 2021-02-25 DIAGNOSIS — H35372 Puckering of macula, left eye: Secondary | ICD-10-CM | POA: Diagnosis not present

## 2021-02-25 DIAGNOSIS — Z961 Presence of intraocular lens: Secondary | ICD-10-CM | POA: Diagnosis not present

## 2021-02-25 DIAGNOSIS — H401133 Primary open-angle glaucoma, bilateral, severe stage: Secondary | ICD-10-CM | POA: Diagnosis not present

## 2021-03-26 ENCOUNTER — Telehealth: Payer: Self-pay | Admitting: Internal Medicine

## 2021-03-26 NOTE — Telephone Encounter (Signed)
Rejection Reason - Patient did not respond" ?Kathleen Wallace said on Mar 26, 2021 10:09 AM ? ?Msg from [place] [facility]  ?

## 2021-04-15 DIAGNOSIS — H16141 Punctate keratitis, right eye: Secondary | ICD-10-CM | POA: Diagnosis not present

## 2021-04-19 DIAGNOSIS — Z03818 Encounter for observation for suspected exposure to other biological agents ruled out: Secondary | ICD-10-CM | POA: Diagnosis not present

## 2021-04-19 DIAGNOSIS — B9789 Other viral agents as the cause of diseases classified elsewhere: Secondary | ICD-10-CM | POA: Diagnosis not present

## 2021-04-19 DIAGNOSIS — R509 Fever, unspecified: Secondary | ICD-10-CM | POA: Diagnosis not present

## 2021-04-19 DIAGNOSIS — R058 Other specified cough: Secondary | ICD-10-CM | POA: Diagnosis not present

## 2021-04-19 DIAGNOSIS — J310 Chronic rhinitis: Secondary | ICD-10-CM | POA: Diagnosis not present

## 2021-04-19 DIAGNOSIS — J028 Acute pharyngitis due to other specified organisms: Secondary | ICD-10-CM | POA: Diagnosis not present

## 2021-04-19 DIAGNOSIS — R059 Cough, unspecified: Secondary | ICD-10-CM | POA: Diagnosis not present

## 2021-06-24 ENCOUNTER — Telehealth: Payer: Self-pay | Admitting: Internal Medicine

## 2021-06-24 NOTE — Telephone Encounter (Signed)
Unable to contact patient. Per Dr Nicki Reaper patient is over due for a follow up appt. If patient calls please schedule her for a follow up, not urgent per Dr Nicki Reaper.

## 2021-07-05 DIAGNOSIS — H35372 Puckering of macula, left eye: Secondary | ICD-10-CM | POA: Diagnosis not present

## 2021-07-05 DIAGNOSIS — Z961 Presence of intraocular lens: Secondary | ICD-10-CM | POA: Diagnosis not present

## 2021-07-05 DIAGNOSIS — H401133 Primary open-angle glaucoma, bilateral, severe stage: Secondary | ICD-10-CM | POA: Diagnosis not present

## 2021-07-05 DIAGNOSIS — H3581 Retinal edema: Secondary | ICD-10-CM | POA: Diagnosis not present

## 2021-07-19 ENCOUNTER — Telehealth: Payer: Self-pay

## 2021-07-19 ENCOUNTER — Other Ambulatory Visit: Payer: Self-pay | Admitting: Internal Medicine

## 2021-07-19 NOTE — Telephone Encounter (Signed)
LMTCB patient needs appointment scheduled. She was given 30 day fill of Eliquis.

## 2021-08-14 ENCOUNTER — Other Ambulatory Visit: Payer: Self-pay

## 2021-08-14 NOTE — Telephone Encounter (Signed)
Noted  

## 2021-08-14 NOTE — Telephone Encounter (Signed)
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

## 2021-09-30 ENCOUNTER — Other Ambulatory Visit: Payer: Self-pay | Admitting: Internal Medicine

## 2021-10-02 ENCOUNTER — Telehealth: Payer: Self-pay | Admitting: *Deleted

## 2021-10-02 NOTE — Patient Outreach (Signed)
  Care Coordination   10/02/2021 Name: Kathleen Wallace MRN: [id number] DOB: 1933-07-05   Care Coordination Outreach Attempts:  An unsuccessful telephone outreach was attempted today to offer the patient information about available care coordination services as a benefit of their health plan.   Follow Up Plan:  Additional outreach attempts will be made to offer the patient care coordination information and services.   Encounter Outcome:  No Answer  Care Coordination Interventions Activated:  Yes   Care Coordination Interventions:  No, not indicated    [facility] [phone]

## 2021-10-08 DIAGNOSIS — H401133 Primary open-angle glaucoma, bilateral, severe stage: Secondary | ICD-10-CM | POA: Diagnosis not present

## 2021-10-08 DIAGNOSIS — H3581 Retinal edema: Secondary | ICD-10-CM | POA: Diagnosis not present

## 2021-10-08 DIAGNOSIS — H35372 Puckering of macula, left eye: Secondary | ICD-10-CM | POA: Diagnosis not present

## 2021-10-08 DIAGNOSIS — Z961 Presence of intraocular lens: Secondary | ICD-10-CM | POA: Diagnosis not present

## 2021-10-15 DIAGNOSIS — J302 Other seasonal allergic rhinitis: Secondary | ICD-10-CM | POA: Diagnosis not present

## 2021-10-15 DIAGNOSIS — J4520 Mild intermittent asthma, uncomplicated: Secondary | ICD-10-CM | POA: Diagnosis not present

## 2021-10-22 ENCOUNTER — Other Ambulatory Visit: Payer: Medicare HMO

## 2021-10-22 ENCOUNTER — Ambulatory Visit (INDEPENDENT_AMBULATORY_CARE_PROVIDER_SITE_OTHER): Payer: Medicare HMO | Admitting: Internal Medicine

## 2021-10-22 VITALS — BP 178/92 | Ht 61.0 in | Wt 127.6 lb

## 2021-10-22 DIAGNOSIS — I10 Essential (primary) hypertension: Secondary | ICD-10-CM

## 2021-10-22 DIAGNOSIS — I4891 Unspecified atrial fibrillation: Secondary | ICD-10-CM

## 2021-10-22 DIAGNOSIS — E7800 Pure hypercholesterolemia, unspecified: Secondary | ICD-10-CM | POA: Diagnosis not present

## 2021-10-22 DIAGNOSIS — R739 Hyperglycemia, unspecified: Secondary | ICD-10-CM

## 2021-10-22 DIAGNOSIS — H9190 Unspecified hearing loss, unspecified ear: Secondary | ICD-10-CM | POA: Diagnosis not present

## 2021-10-22 DIAGNOSIS — R69 Illness, unspecified: Secondary | ICD-10-CM | POA: Diagnosis not present

## 2021-10-22 DIAGNOSIS — F439 Reaction to severe stress, unspecified: Secondary | ICD-10-CM

## 2021-10-22 LAB — CBC WITH DIFFERENTIAL/PLATELET
Absolute Monocytes: 542 cells/uL (ref 200–950)
Basophils Absolute: 38 cells/uL (ref 0–200)
Basophils Relative: 0.6 %
Eosinophils Absolute: 340 cells/uL (ref 15–500)
Eosinophils Relative: 5.4 %
HCT: 38.8 % (ref 35.0–45.0)
Hemoglobin: 13.1 g/dL (ref 11.7–15.5)
Lymphs Abs: 1846 cells/uL (ref 850–3900)
MCH: 31.6 pg (ref 27.0–33.0)
MCHC: 33.8 g/dL (ref 32.0–36.0)
MCV: 93.5 fL (ref 80.0–100.0)
MPV: 10.3 fL (ref 7.5–12.5)
Monocytes Relative: 8.6 %
Neutro Abs: 3534 cells/uL (ref 1500–7800)
Neutrophils Relative %: 56.1 %
Platelets: 279 10*3/uL (ref 140–400)
RBC: 4.15 10*6/uL (ref 3.80–5.10)
RDW: 13.1 % (ref 11.0–15.0)
Total Lymphocyte: 29.3 %
WBC: 6.3 10*3/uL (ref 3.8–10.8)

## 2021-10-22 LAB — HEPATIC FUNCTION PANEL
ALT: 12 U/L (ref 0–35)
AST: 14 U/L (ref 0–37)
Albumin: 4.4 g/dL (ref 3.5–5.2)
Alkaline Phosphatase: 60 U/L (ref 39–117)
Bilirubin, Direct: 0.1 mg/dL (ref 0.0–0.3)
Total Bilirubin: 0.7 mg/dL (ref 0.2–1.2)
Total Protein: 6.3 g/dL (ref 6.0–8.3)

## 2021-10-22 LAB — BASIC METABOLIC PANEL WITH GFR
BUN: 19 mg/dL (ref 6–23)
CO2: 23 meq/L (ref 19–32)
Calcium: 9 mg/dL (ref 8.4–10.5)
Chloride: 107 meq/L (ref 96–112)
Creatinine, Ser: 0.79 mg/dL (ref 0.40–1.20)
GFR: 67.85 mL/min
Glucose, Bld: 101 mg/dL — ABNORMAL HIGH (ref 70–99)
Potassium: 4.3 meq/L (ref 3.5–5.1)
Sodium: 139 meq/L (ref 135–145)

## 2021-10-22 LAB — LIPID PANEL
Cholesterol: 298 mg/dL — ABNORMAL HIGH (ref 0–200)
HDL: 62.3 mg/dL (ref >39.00)
LDL Cholesterol: 205 mg/dL — ABNORMAL HIGH (ref 0–99)
NonHDL: 236.17
Total CHOL/HDL Ratio: 5
Triglycerides: 155 mg/dL — ABNORMAL HIGH (ref 0.0–149.0)
VLDL: 31 mg/dL (ref 0.0–40.0)

## 2021-10-22 LAB — HEMOGLOBIN A1C: Hgb A1c MFr Bld: 6 % (ref 4.6–6.5)

## 2021-10-22 MED ORDER — AMLODIPINE BESYLATE 5 MG PO TABS: 5.0000 mg | ORAL_TABLET | Freq: Every day | ORAL | 2 refills | Status: DC

## 2021-10-22 NOTE — Progress Notes (Signed)
[name], female   DOB: 1933/09/02, 88 y.o.   MRN: [id number]   Subjective:    [name], female    DOB: 1933/11/01, 88 y.o.   MRN: [id number]   Patient here for  Chief Complaint  Patient presents with   Follow-up    Follow up   .   HPI Here to follow up regarding blood pressure and cholesterol.  History of asthmatic bronchitis.  Seeing pulmonary - Breo and albuterol.  Evaluated 10/15/21 - stable.  She feels her breathing is stable.  Stays active.  No chest pain with increased activity or exertion.  No increased cough or congestion.  Did have an episode approximately two weeks ago - upper back pain. Brief pain.  Unclear etiology.  Does a lot of physical activity.  Has not had the pain again.  Discussed checking EKG today and further w/up.  Declines.  No increased acid reflux reported.  No abdominal pain.  Bowels moving.     Past Medical History:  Diagnosis Date   Cancer ([place])    skin cancer nose   Complication of anesthesia    woke up during partial hysterectomy   Elevated blood pressure    GERD (gastroesophageal reflux disease)    occ-no meds   Headache    h/o    Heart murmur    asymptomatic   Past Surgical History:  Procedure Laterality Date   ABDOMINAL HYSTERECTOMY  1974   partial   BREAST SURGERY     IMPLANTS   CATARACT EXTRACTION W/PHACO Right 02/16/2018   Procedure: CATARACT EXTRACTION PHACO AND INTRAOCULAR LENS PLACEMENT ([place]) RIGHT;  Surgeon: Marchia Meiers, MD;  Location: [facility] ORS;  Service: Ophthalmology;  Laterality: Right;  [place]  01:10 CDE 11.02 Fluid pack lot # [id number]   CATARACT EXTRACTION W/PHACO Left 03/18/2018   Procedure: CATARACT EXTRACTION PHACO AND INTRAOCULAR LENS PLACEMENT ([place]) LEFT;  Surgeon: Marchia Meiers, MD;  Location: [facility] OR[facility];  Service: Ophthalmology;  Laterality: Left;  [place] 01:06.5 CDE 10.80 FLUID PACK LOT # [id number]   LUMBAR LAMINECTOMY/ DECOMPRESSION WITH MET-RX Bilateral 09/20/2018   Procedure: LUMBAR LAMINECTOMY/  DECOMPRESSION WITH MET-RX;  Surgeon: Deetta Perla, MD;  Location: [facility] ORS;  Service: Neurosurgery;  Laterality: Bilateral;   TONSILLECTOMY AND ADENOIDECTOMY  58   Family History  Problem Relation Age of Onset   Stroke Mother    Hypertension Mother    Stroke Father    Hypertension Father    Diabetes Other    Social History   Socioeconomic History   Marital status: Married    Spouse name: Not on file   Number of children: Not on file   Years of education: Not on file   Highest education level: Not on file  Occupational History   Not on file  Tobacco Use   Smoking status: Never   Smokeless tobacco: Never  Vaping Use   Vaping Use: Never used  Substance and Sexual Activity   Alcohol use: No    Alcohol/week: 0.0 standard drinks of alcohol   Drug use: No   Sexual activity: Not on file  Other Topics Concern   Not on file  Social History Narrative   Not on file   Social Determinants of Health   Financial Resource Strain: Low Risk  (01/07/2021)   Overall Financial Resource Strain (CARDIA)    Difficulty of Paying Living Expenses: Not hard at all  Food Insecurity: No Food Insecurity (01/07/2021)   Hunger Vital Sign  Worried About Charity fundraiser in the Last Year: Never true    [place] in the Last Year: Never true  Transportation Needs: No Transportation Needs (01/07/2021)   PRAPARE - Hydrologist (Medical): No    Lack of Transportation (Non-Medical): No  Physical Activity: Not on file  Stress: No Stress Concern Present (01/07/2021)   [place]    Feeling of Stress : Not at all  Social Connections: Unknown (01/07/2021)   Social Connection and Isolation Panel [NHANES]    Frequency of Communication with Friends and Family: Not on file    Frequency of Social Gatherings with Friends and Family: Not on file    Attends Religious Services: Not on file    Active Member  of Clubs or Organizations: Not on file    Attends Archivist Meetings: Not on file    Marital Status: Married     Review of Systems  Constitutional:  Negative for appetite change and unexpected weight change.  HENT:  Negative for congestion and sinus pressure.   Respiratory:  Negative for cough, chest tightness and shortness of breath.   Cardiovascular:  Negative for chest pain, palpitations and leg swelling.  Gastrointestinal:  Negative for abdominal pain, diarrhea, nausea and vomiting.  Genitourinary:  Negative for difficulty urinating and dysuria.  Musculoskeletal:  Negative for joint swelling and myalgias.  Skin:  Negative for color change and rash.  Neurological:  Negative for dizziness, light-headedness and headaches.  Psychiatric/Behavioral:  Negative for agitation and dysphoric mood.        Objective:     BP (!) 178/92  Wt Readings from Last 3 Encounters:  01/07/21 126 lb (57.2 kg)  01/02/21 126 lb (57.2 kg)  05/17/20 118 lb 12.8 oz (53.9 kg)    Physical Exam Vitals reviewed.  Constitutional:      General: She is not in acute distress.    Appearance: Normal appearance.  HENT:     Head: Normocephalic and atraumatic.     Right Ear: External ear normal.     Left Ear: External ear normal.     Mouth/Throat:     Pharynx: Oropharynx is clear. No oropharyngeal exudate or posterior oropharyngeal erythema.  Eyes:     General: No scleral icterus.       Right eye: No discharge.        Left eye: No discharge.     Conjunctiva/sclera: Conjunctivae normal.  Neck:     Thyroid: No thyromegaly.  Cardiovascular:     Rate and Rhythm: Normal rate and regular rhythm.  Pulmonary:     Effort: No respiratory distress.     Breath sounds: Normal breath sounds. No wheezing.  Abdominal:     General: Bowel sounds are normal.     Palpations: Abdomen is soft.     Tenderness: There is no abdominal tenderness.  Musculoskeletal:        General: No swelling or tenderness.      Cervical back: Neck supple. No tenderness.  Lymphadenopathy:     Cervical: No cervical adenopathy.  Skin:    Findings: No erythema or rash.  Neurological:     Mental Status: She is alert.  Psychiatric:        Mood and Affect: Mood normal.        Behavior: Behavior normal.      Outpatient Encounter Medications as of 10/22/2021  Medication Sig   amLODipine (NORVASC)  5 MG tablet Take 1 tablet (5 mg total) by mouth daily.   Coenzyme Q10 (COQ10) 100 MG CAPS Take 100 mg by mouth daily.    dorzolamidel-timolol (COSOPT) 22.3-6.8 MG/ML SOLN ophthalmic solution Place 1 drop into both eyes 2 (two) times daily.   ELIQUIS 2.5 MG TABS tablet TAKE ONE TABLET TWICE DAILY   latanoprost (XALATAN) 0.005 % ophthalmic solution Place 1 drop into both eyes at bedtime.   Multiple Vitamins-Minerals (MULTIVITAMIN WITH MINERALS) tablet Take 1 tablet by mouth daily.    ROCKLATAN 0.02-0.005 % SOLN SMARTSIG:1 Drop(s) In Eye(s) Every Evening   vitamin C (ASCORBIC ACID) 500 MG tablet Take 500 mg by mouth daily.    No facility-administered encounter medications on file as of 10/22/2021.     Lab Results  Component Value Date   WBC 6.3 10/22/2021   HGB 13.1 10/22/2021   HCT 38.8 10/22/2021   PLT 279 10/22/2021   GLUCOSE 101 (H) 10/22/2021   CHOL 298 (H) 10/22/2021   TRIG 155.0 (H) 10/22/2021   HDL 62.30 10/22/2021   LDLDIRECT 192.0 02/13/2020   LDLCALC 205 (H) 10/22/2021   ALT 12 10/22/2021   AST 14 10/22/2021   NA 139 10/22/2021   K 4.3 10/22/2021   CL 107 10/22/2021   CREATININE 0.79 10/22/2021   BUN 19 10/22/2021   CO2 23 10/22/2021   TSH 2.63 01/02/2021   INR 1.1 01/17/2020   HGBA1C 6.0 10/22/2021    DG Chest Port 1 View  Result Date: 01/19/2020 CLINICAL DATA:  Dyspnea EXAM: PORTABLE CHEST 1 VIEW COMPARISON:  01/17/2020 FINDINGS: Lung volumes are small, but are symmetric and pulmonary insufflation remain stable. Superimposed multifocal pulmonary infiltrate is again identified and has  progressed, particularly within the right upper lobe. No pneumothorax or pleural effusion. Cardiac size within normal limits. Bilateral breast implants noted. No acute bone abnormality. IMPRESSION: Progressive multifocal pulmonary infiltrates, more focal within the right upper lobe, in keeping with changes of atypical infection in the acute setting. Electronically Signed   By: Fidela Salisbury MD   On: 01/19/2020 05:58   ECHOCARDIOGRAM COMPLETE  Result Date: 01/18/2020    ECHOCARDIOGRAM REPORT   Patient Name:   SHAKILA MAK Date of Exam: 01/18/2020 Medical Rec #:  [id number]    Height:       61.0 in Accession #:    [id number]   Weight:       120.0 lb Date of Birth:  [birth year]    BSA:          1.520 m Patient Age:    27 years     BP:           126/60 mmHg Patient Gender: F            HR:           80 bpm. Exam Location:  [facility] Procedure: 2D Echo, Color Doppler, Cardiac Doppler and Strain Analysis Indications:     I48.91 Atrial fibrillation  History:         Patient has no prior history of Echocardiogram examinations.  Sonographer:     Charmayne Sheer RDCS (AE) Referring Phys:  [id number] Arvella Merles [place] Diagnosing Phys: Bartholome Bill MD  Sonographer Comments: Suboptimal parasternal window. Image acquisition challenging due to breast implants. Global longitudinal strain was attempted. IMPRESSIONS  1. Left ventricular ejection fraction, by estimation, is 55 to 60%. The left ventricle has normal function. The left ventricle has no regional wall motion abnormalities. There is moderate left ventricular  hypertrophy. Left ventricular diastolic parameters are consistent with Grade I diastolic dysfunction (impaired relaxation).  2. Right ventricular systolic function is normal. The right ventricular size is normal.  3. The mitral valve is grossly normal. Trivial mitral valve regurgitation.  4. The aortic valve is grossly normal. Aortic valve regurgitation is trivial. FINDINGS  Left Ventricle: Left ventricular ejection fraction, by  estimation, is 55 to 60%. The left ventricle has normal function. The left ventricle has no regional wall motion abnormalities. The left ventricular internal cavity size was normal in size. There is  moderate left ventricular hypertrophy. Left ventricular diastolic parameters are consistent with Grade I diastolic dysfunction (impaired relaxation). Right Ventricle: The right ventricular size is normal. No increase in right ventricular wall thickness. Right ventricular systolic function is normal. Left Atrium: Left atrial size was normal in size. Right Atrium: Right atrial size was normal in size. Pericardium: There is no evidence of pericardial effusion. Mitral Valve: The mitral valve is grossly normal. Trivial mitral valve regurgitation. MV peak gradient, 6.2 mmHg. The mean mitral valve gradient is 3.0 mmHg. Tricuspid Valve: The tricuspid valve is grossly normal. Tricuspid valve regurgitation is trivial. Aortic Valve: The aortic valve is grossly normal. Aortic valve regurgitation is trivial. Aortic valve mean gradient measures 9.0 mmHg. Aortic valve peak gradient measures 14.7 mmHg. Aortic valve area, by VTI measures 1.18 cm. Pulmonic Valve: The pulmonic valve was not assessed. Pulmonic valve regurgitation is not visualized. Aorta: The aortic root is normal in size and structure. IAS/Shunts: The interatrial septum was not assessed.  LEFT VENTRICLE PLAX 2D LVIDd:         4.07 cm  Diastology LVIDs:         2.85 cm  LV e' medial:    5.33 cm/s LV PW:         1.05 cm  LV E/e' medial:  17.3 LV IVS:        0.88 cm  LV e' lateral:   6.53 cm/s LVOT diam:     1.90 cm  LV E/e' lateral: 14.1 LV SV:         46 LV SV Index:   31 LVOT Area:     2.84 cm  RIGHT VENTRICLE RV Basal diam:  2.52 cm LEFT ATRIUM             Index       RIGHT ATRIUM           Index LA Vol (A2C):   24.7 ml 16.25 ml/m RA Area:     10.90 cm LA Vol (A4C):   25.8 ml 16.97 ml/m RA Volume:   26.80 ml  17.63 ml/m LA Biplane Vol: 27.1 ml 17.83 ml/m  AORTIC  VALVE                    PULMONIC VALVE AV Area (Vmax):    1.31 cm     PV Vmax:       0.85 m/s AV Area (Vmean):   1.32 cm     PV Vmean:      60.100 cm/s AV Area (VTI):     1.18 cm     PV VTI:        0.150 m AV Vmax:           192.00 cm/s  PV Peak grad:  2.9 mmHg AV Vmean:          137.000 cm/s PV Mean grad:  2.0 mmHg AV VTI:  0.395 m AV Peak Grad:      14.7 mmHg AV Mean Grad:      9.0 mmHg LVOT Vmax:         88.80 cm/s LVOT Vmean:        63.800 cm/s LVOT VTI:          0.164 m LVOT/AV VTI ratio: 0.42  AORTA Ao Root diam: 2.60 cm MITRAL VALVE MV Area (PHT): 4.19 cm     SHUNTS MV Peak grad:  6.2 mmHg     Systemic VTI:  0.16 m MV Mean grad:  3.0 mmHg     Systemic Diam: 1.90 cm MV Vmax:       1.25 m/s MV Vmean:      77.4 cm/s MV Decel Time: 181 msec MV E velocity: 92.10 cm/s MV A velocity: 131.00 cm/s MV E/A ratio:  0.70 Bartholome Bill MD Electronically signed by Bartholome Bill MD Signature Date/Time: 01/18/2020/4:55:43 PM    Final        Assessment & Plan:   Problem List Items Addressed This Visit     A-fib [facility])    Denies any increased heart rate or palpitations.  Continues on eliquis.  No chest pain or sob.        Relevant Medications   amLODipine (NORVASC) 5 MG tablet   Decreased hearing    Discussed further hearing evaluation.  Declines at this time. f ollow.       Essential hypertension - Primary    Blood pressure elevated.  Discussed.  She has declined blood pressure medication previously.  Agreeable to day.  Start amlodipine daily.  Follow pressures.  Have her spot check her pressure.  Get her back in soon to reassess.  Follow metabolic panel.       Relevant Medications   amLODipine (NORVASC) 5 MG tablet   Other Relevant Orders   Basic Metabolic Panel (BMET) (Completed)   Hepatic function panel (Completed)   Hypercholesterolemia    Have discussed recommendation to start cholesterol medication.  She declines.  Low cholesterol diet and exercise.  Follow lipid panel.        Relevant Medications   amLODipine (NORVASC) 5 MG tablet   Other Relevant Orders   CBC w/Diff   Lipid panel (Completed)   Hyperglycemia    Low carb diet and exercise.  Follow met b and a1c.       Relevant Orders   Hemoglobin A1C (Completed)   Stress    Overall appears to be handling things relatively well.  Follow.         Einar Pheasant, MD

## 2021-10-23 ENCOUNTER — Telehealth: Payer: Self-pay

## 2021-10-23 NOTE — Telephone Encounter (Signed)
LMTCB for lab results.  

## 2021-10-24 NOTE — Telephone Encounter (Signed)
Patient returned our call. Cheri Rous, CMA, is not available at time of call.  I let patient know that I will send a note back and we will call her back.

## 2021-10-24 NOTE — Telephone Encounter (Signed)
See result note.  

## 2021-10-27 ENCOUNTER — Encounter: Payer: Self-pay | Admitting: Internal Medicine

## 2021-10-27 NOTE — Assessment & Plan Note (Signed)
Overall appears to be handling things relatively well.  Follow.   

## 2021-10-27 NOTE — Assessment & Plan Note (Signed)
Discussed further hearing evaluation.  Declines at this time. f ollow.

## 2021-10-27 NOTE — Assessment & Plan Note (Signed)
Have discussed recommendation to start cholesterol medication.  She declines.  Low cholesterol diet and exercise.  Follow lipid panel.  

## 2021-10-27 NOTE — Assessment & Plan Note (Signed)
Blood pressure elevated.  Discussed.  She has declined blood pressure medication previously.  Agreeable to day.  Start amlodipine daily.  Follow pressures.  Have her spot check her pressure.  Get her back in soon to reassess.  Follow metabolic panel.

## 2021-10-27 NOTE — Assessment & Plan Note (Signed)
Denies any increased heart rate or palpitations.  Continues on eliquis.  No chest pain or sob.   

## 2021-10-27 NOTE — Assessment & Plan Note (Signed)
Low carb diet and exercise.  Follow met b and a1c.  

## 2021-11-18 ENCOUNTER — Encounter (INDEPENDENT_AMBULATORY_CARE_PROVIDER_SITE_OTHER): Payer: Self-pay

## 2021-11-19 ENCOUNTER — Ambulatory Visit (INDEPENDENT_AMBULATORY_CARE_PROVIDER_SITE_OTHER): Payer: Medicare HMO | Admitting: Internal Medicine

## 2021-11-19 ENCOUNTER — Encounter: Payer: Self-pay | Admitting: Internal Medicine

## 2021-11-19 VITALS — BP 160/80 | HR 76 | Temp 98.1°F | Resp 17 | Ht 61.0 in | Wt 127.8 lb

## 2021-11-19 DIAGNOSIS — I4891 Unspecified atrial fibrillation: Secondary | ICD-10-CM | POA: Diagnosis not present

## 2021-11-19 DIAGNOSIS — R739 Hyperglycemia, unspecified: Secondary | ICD-10-CM

## 2021-11-19 DIAGNOSIS — I10 Essential (primary) hypertension: Secondary | ICD-10-CM

## 2021-11-19 DIAGNOSIS — E7800 Pure hypercholesterolemia, unspecified: Secondary | ICD-10-CM | POA: Diagnosis not present

## 2021-11-19 MED ORDER — AMLODIPINE BESYLATE 5 MG PO TABS: 5.0000 mg | ORAL_TABLET | Freq: Every day | ORAL | 3 refills | Status: DC

## 2021-11-19 NOTE — Progress Notes (Signed)
[name], female   DOB: 06-Dec-1933, 88 y.o.   MRN: [id number]   Subjective:    [name], female    DOB: 07/08/1933, 88 y.o.   MRN: [id number]   Patient here for  Chief Complaint  Patient presents with   Follow-up   Hypertension   .   HPI Here to follow up regarding her blood pressure.  Started on amlodipine last visit.  Brings in recording outside blood pressures - most averaging 120-130s/70-80s.  Stays active.  No chest pain or sob reported.  No abdominal pain or bowel change reported.  Tolerating medication.      Past Medical History:  Diagnosis Date   Cancer ([place])    skin cancer nose   Complication of anesthesia    woke up during partial hysterectomy   Elevated blood pressure    GERD (gastroesophageal reflux disease)    occ-no meds   Headache    h/o    Heart murmur    asymptomatic   Past Surgical History:  Procedure Laterality Date   ABDOMINAL HYSTERECTOMY  1974   partial   BREAST SURGERY     IMPLANTS   CATARACT EXTRACTION W/PHACO Right 02/16/2018   Procedure: CATARACT EXTRACTION PHACO AND INTRAOCULAR LENS PLACEMENT ([place]) RIGHT;  Surgeon: Marchia Meiers, MD;  Location: [facility] ORS;  Service: Ophthalmology;  Laterality: Right;  [place]  01:10 CDE 11.02 Fluid pack lot # [id number]   CATARACT EXTRACTION W/PHACO Left 03/18/2018   Procedure: CATARACT EXTRACTION PHACO AND INTRAOCULAR LENS PLACEMENT ([place]) LEFT;  Surgeon: Marchia Meiers, MD;  Location: [facility] OR[facility];  Service: Ophthalmology;  Laterality: Left;  [place] 01:06.5 CDE 10.80 FLUID PACK LOT # [id number]   LUMBAR LAMINECTOMY/ DECOMPRESSION WITH MET-RX Bilateral 09/20/2018   Procedure: LUMBAR LAMINECTOMY/ DECOMPRESSION WITH MET-RX;  Surgeon: Deetta Perla, MD;  Location: [facility] OR[facility];  Service: Neurosurgery;  Laterality: Bilateral;   TONSILLECTOMY AND ADENOIDECTOMY  30   Family History  Problem Relation Age of Onset   Stroke Mother    Hypertension Mother    Stroke Father    Hypertension Father    Diabetes  Other    Social History   Socioeconomic History   Marital status: Married    Spouse name: Not on file   Number of children: Not on file   Years of education: Not on file   Highest education level: Not on file  Occupational History   Not on file  Tobacco Use   Smoking status: Never   Smokeless tobacco: Never  Vaping Use   Vaping Use: Never used  Substance and Sexual Activity   Alcohol use: No    Alcohol/week: 0.0 standard drinks of alcohol   Drug use: No   Sexual activity: Not on file  Other Topics Concern   Not on file  Social History Narrative   Not on file   Social Determinants of Health   Financial Resource Strain: Low Risk  (01/07/2021)   Overall Financial Resource Strain (CARDIA)    Difficulty of Paying Living Expenses: Not hard at all  Food Insecurity: No Food Insecurity (01/07/2021)   Hunger Vital Sign    Worried About Running Out of Food in the Last Year: Never true    [place] in the Last Year: Never true  Transportation Needs: No Transportation Needs (01/07/2021)   PRAPARE - Hydrologist (Medical): No    Lack of Transportation (Non-Medical): No  Physical Activity: Not on file  Stress: No Stress Concern Present (01/07/2021)   [place]    Feeling of Stress : Not at all  Social Connections: Unknown (01/07/2021)   Social Connection and Isolation Panel [NHANES]    Frequency of Communication with Friends and Family: Not on file    Frequency of Social Gatherings with Friends and Family: Not on file    Attends Religious Services: Not on file    Active Member of Clubs or Organizations: Not on file    Attends Archivist Meetings: Not on file    Marital Status: Married     Review of Systems  Constitutional:  Negative for appetite change and unexpected weight change.  HENT:  Negative for congestion and sinus pressure.   Respiratory:  Negative for  cough, chest tightness and shortness of breath.   Cardiovascular:  Negative for chest pain, palpitations and leg swelling.  Gastrointestinal:  Negative for abdominal pain, diarrhea, nausea and vomiting.  Genitourinary:  Negative for difficulty urinating and dysuria.  Musculoskeletal:  Negative for joint swelling and myalgias.  Skin:  Negative for color change and rash.  Neurological:  Negative for dizziness, light-headedness and headaches.  Psychiatric/Behavioral:  Negative for agitation and dysphoric mood.        Objective:     BP (!) 160/80 (BP Location: Left Arm, Patient Position: Sitting, Cuff Size: Small)   Pulse 76   Temp 98.1 F (36.7 C) (Temporal)   Resp 17   Ht _0  (1.549 m)   Wt 127 lb 12.8 oz (58 kg)   SpO2 98%   BMI 24.15 kg/m  Wt Readings from Last 3 Encounters:  11/19/21 127 lb 12.8 oz (58 kg)  10/22/21 127 lb 9.6 oz (57.9 kg)  01/07/21 126 lb (57.2 kg)    Physical Exam Vitals reviewed.  Constitutional:      General: She is not in acute distress.    Appearance: Normal appearance.  HENT:     Head: Normocephalic and atraumatic.     Right Ear: External ear normal.     Left Ear: External ear normal.  Eyes:     General: No scleral icterus.       Right eye: No discharge.        Left eye: No discharge.     Conjunctiva/sclera: Conjunctivae normal.  Neck:     Thyroid: No thyromegaly.  Cardiovascular:     Rate and Rhythm: Normal rate and regular rhythm.  Pulmonary:     Effort: No respiratory distress.     Breath sounds: Normal breath sounds. No wheezing.  Abdominal:     General: Bowel sounds are normal.     Palpations: Abdomen is soft.     Tenderness: There is no abdominal tenderness.  Musculoskeletal:        General: No swelling or tenderness.     Cervical back: Neck supple. No tenderness.  Lymphadenopathy:     Cervical: No cervical adenopathy.  Skin:    Findings: No erythema or rash.  Neurological:     Mental Status: She is alert.  Psychiatric:         Mood and Affect: Mood normal.        Behavior: Behavior normal.      Outpatient Encounter Medications as of 11/19/2021  Medication Sig   amLODipine (NORVASC) 5 MG tablet Take 1 tablet (5 mg total) by mouth daily.   Coenzyme Q10 (COQ10) 100 MG CAPS Take 100 mg by mouth daily.  dorzolamidel-timolol (COSOPT) 22.3-6.8 MG/ML SOLN ophthalmic solution Place 1 drop into both eyes 2 (two) times daily.   ELIQUIS 2.5 MG TABS tablet TAKE ONE TABLET TWICE DAILY   latanoprost (XALATAN) 0.005 % ophthalmic solution Place 1 drop into both eyes at bedtime.   Multiple Vitamins-Minerals (MULTIVITAMIN WITH MINERALS) tablet Take 1 tablet by mouth daily.    ROCKLATAN 0.02-0.005 % SOLN SMARTSIG:1 Drop(s) In Eye(s) Every Evening   vitamin C (ASCORBIC ACID) 500 MG tablet Take 500 mg by mouth daily.    [DISCONTINUED] amLODipine (NORVASC) 5 MG tablet Take 1 tablet (5 mg total) by mouth daily.   No facility-administered encounter medications on file as of 11/19/2021.     Lab Results  Component Value Date   WBC 6.3 10/22/2021   HGB 13.1 10/22/2021   HCT 38.8 10/22/2021   PLT 279 10/22/2021   GLUCOSE 101 (H) 10/22/2021   CHOL 298 (H) 10/22/2021   TRIG 155.0 (H) 10/22/2021   HDL 62.30 10/22/2021   LDLDIRECT 192.0 02/13/2020   LDLCALC 205 (H) 10/22/2021   ALT 12 10/22/2021   AST 14 10/22/2021   NA 139 10/22/2021   K 4.3 10/22/2021   CL 107 10/22/2021   CREATININE 0.79 10/22/2021   BUN 19 10/22/2021   CO2 23 10/22/2021   TSH 2.63 01/02/2021   INR 1.1 01/17/2020   HGBA1C 6.0 10/22/2021    DG Chest Port 1 View  Result Date: 01/19/2020 CLINICAL DATA:  Dyspnea EXAM: PORTABLE CHEST 1 VIEW COMPARISON:  01/17/2020 FINDINGS: Lung volumes are small, but are symmetric and pulmonary insufflation remain stable. Superimposed multifocal pulmonary infiltrate is again identified and has progressed, particularly within the right upper lobe. No pneumothorax or pleural effusion. Cardiac size within normal  limits. Bilateral breast implants noted. No acute bone abnormality. IMPRESSION: Progressive multifocal pulmonary infiltrates, more focal within the right upper lobe, in keeping with changes of atypical infection in the acute setting. Electronically Signed   By: Fidela Salisbury MD   On: 01/19/2020 05:58   ECHOCARDIOGRAM COMPLETE  Result Date: 01/18/2020    ECHOCARDIOGRAM REPORT   Patient Name:   CHAI VERDEJO Date of Exam: 01/18/2020 Medical Rec #:  [id number]    Height:       61.0 in Accession #:    [id number]   Weight:       120.0 lb Date of Birth:  June 27, 1933    BSA:          1.520 m Patient Age:    76 years     BP:           126/60 mmHg Patient Gender: F            HR:           80 bpm. Exam Location:  [facility] Procedure: 2D Echo, Color Doppler, Cardiac Doppler and Strain Analysis Indications:     I48.91 Atrial fibrillation  History:         Patient has no prior history of Echocardiogram examinations.  Sonographer:     Charmayne Sheer RDCS (AE) Referring Phys:  [id number] Arvella Merles [place] Diagnosing Phys: Bartholome Bill MD  Sonographer Comments: Suboptimal parasternal window. Image acquisition challenging due to breast implants. Global longitudinal strain was attempted. IMPRESSIONS  1. Left ventricular ejection fraction, by estimation, is 55 to 60%. The left ventricle has normal function. The left ventricle has no regional wall motion abnormalities. There is moderate left ventricular hypertrophy. Left ventricular diastolic parameters are consistent with Grade I diastolic dysfunction (  impaired relaxation).  2. Right ventricular systolic function is normal. The right ventricular size is normal.  3. The mitral valve is grossly normal. Trivial mitral valve regurgitation.  4. The aortic valve is grossly normal. Aortic valve regurgitation is trivial. FINDINGS  Left Ventricle: Left ventricular ejection fraction, by estimation, is 55 to 60%. The left ventricle has normal function. The left ventricle has no regional wall motion  abnormalities. The left ventricular internal cavity size was normal in size. There is  moderate left ventricular hypertrophy. Left ventricular diastolic parameters are consistent with Grade I diastolic dysfunction (impaired relaxation). Right Ventricle: The right ventricular size is normal. No increase in right ventricular wall thickness. Right ventricular systolic function is normal. Left Atrium: Left atrial size was normal in size. Right Atrium: Right atrial size was normal in size. Pericardium: There is no evidence of pericardial effusion. Mitral Valve: The mitral valve is grossly normal. Trivial mitral valve regurgitation. MV peak gradient, 6.2 mmHg. The mean mitral valve gradient is 3.0 mmHg. Tricuspid Valve: The tricuspid valve is grossly normal. Tricuspid valve regurgitation is trivial. Aortic Valve: The aortic valve is grossly normal. Aortic valve regurgitation is trivial. Aortic valve mean gradient measures 9.0 mmHg. Aortic valve peak gradient measures 14.7 mmHg. Aortic valve area, by VTI measures 1.18 cm. Pulmonic Valve: The pulmonic valve was not assessed. Pulmonic valve regurgitation is not visualized. Aorta: The aortic root is normal in size and structure. IAS/Shunts: The interatrial septum was not assessed.  LEFT VENTRICLE PLAX 2D LVIDd:         4.07 cm  Diastology LVIDs:         2.85 cm  LV e' medial:    5.33 cm/s LV PW:         1.05 cm  LV E/e' medial:  17.3 LV IVS:        0.88 cm  LV e' lateral:   6.53 cm/s LVOT diam:     1.90 cm  LV E/e' lateral: 14.1 LV SV:         46 LV SV Index:   31 LVOT Area:     2.84 cm  RIGHT VENTRICLE RV Basal diam:  2.52 cm LEFT ATRIUM             Index       RIGHT ATRIUM           Index LA Vol (A2C):   24.7 ml 16.25 ml/m RA Area:     10.90 cm LA Vol (A4C):   25.8 ml 16.97 ml/m RA Volume:   26.80 ml  17.63 ml/m LA Biplane Vol: 27.1 ml 17.83 ml/m  AORTIC VALVE                    PULMONIC VALVE AV Area (Vmax):    1.31 cm     PV Vmax:       0.85 m/s AV Area (Vmean):    1.32 cm     PV Vmean:      60.100 cm/s AV Area (VTI):     1.18 cm     PV VTI:        0.150 m AV Vmax:           192.00 cm/s  PV Peak grad:  2.9 mmHg AV Vmean:          137.000 cm/s PV Mean grad:  2.0 mmHg AV VTI:            0.395 m AV Peak Grad:  14.7 mmHg AV Mean Grad:      9.0 mmHg LVOT Vmax:         88.80 cm/s LVOT Vmean:        63.800 cm/s LVOT VTI:          0.164 m LVOT/AV VTI ratio: 0.42  AORTA Ao Root diam: 2.60 cm MITRAL VALVE MV Area (PHT): 4.19 cm     SHUNTS MV Peak grad:  6.2 mmHg     Systemic VTI:  0.16 m MV Mean grad:  3.0 mmHg     Systemic Diam: 1.90 cm MV Vmax:       1.25 m/s MV Vmean:      77.4 cm/s MV Decel Time: 181 msec MV E velocity: 92.10 cm/s MV A velocity: 131.00 cm/s MV E/A ratio:  0.70 Bartholome Bill MD Electronically signed by Bartholome Bill MD Signature Date/Time: 01/18/2020/4:55:43 PM    Final        Assessment & Plan:   Problem List Items Addressed This Visit     A-fib ([place]) - Primary    Denies any increased heart rate or palpitations.  Continues on eliquis.  No chest pain or sob.        Relevant Medications   amLODipine (NORVASC) 5 MG tablet   Essential hypertension    On amlodipine 9m q day.  Taking regularly.  Tolerating.  Discussed increasing amlodipine or adding additional medication to reach goal blood pressure.  She declines. Will notify me if changes her mind.       Relevant Medications   amLODipine (NORVASC) 5 MG tablet   Hypercholesterolemia    Have discussed recommendation to start cholesterol medication.  She declines.  Low cholesterol diet and exercise.  Follow lipid panel.       Relevant Medications   amLODipine (NORVASC) 5 MG tablet   Hyperglycemia    Low carb diet and exercise.  Follow met b and a1c.         CEinar Pheasant MD

## 2021-11-24 ENCOUNTER — Encounter: Payer: Self-pay | Admitting: Internal Medicine

## 2021-11-24 NOTE — Assessment & Plan Note (Signed)
Have discussed recommendation to start cholesterol medication.  She declines.  Low cholesterol diet and exercise.  Follow lipid panel.

## 2021-11-24 NOTE — Assessment & Plan Note (Signed)
Denies any increased heart rate or palpitations.  Continues on eliquis.  No chest pain or sob.

## 2021-11-24 NOTE — Assessment & Plan Note (Signed)
Low carb diet and exercise.  Follow met b and a1c.  

## 2021-11-24 NOTE — Assessment & Plan Note (Signed)
On amlodipine '5mg'$  q day.  Taking regularly.  Tolerating.  Discussed increasing amlodipine or adding additional medication to reach goal blood pressure.  She declines. Will notify me if changes her mind.

## 2021-12-02 ENCOUNTER — Other Ambulatory Visit: Payer: Self-pay | Admitting: Internal Medicine

## 2022-01-03 ENCOUNTER — Telehealth: Payer: Self-pay | Admitting: Internal Medicine

## 2022-01-03 NOTE — Telephone Encounter (Signed)
Copied from [place] [id number]. Topic: Medicare AWV >> Jan 03, 2022  3:09 PM Devoria Glassing wrote: Reason for CRM: Left message for patient to schedule Annual Wellness Visit.  Please schedule with Nurse Health Advisor Madelyn Brunner, LPN at [facility]. This appt can be telephone or office visit.  Please call [phone] ask for [facility] [address]

## 2022-01-03 NOTE — Telephone Encounter (Signed)
Spoke with patient she req CB at 3pm today

## 2022-01-07 ENCOUNTER — Telehealth: Payer: Self-pay | Admitting: Internal Medicine

## 2022-01-07 NOTE — Telephone Encounter (Signed)
Copied from [place] [phone]. Topic: Medicare AWV >> Jan 07, 2022 11:21 AM Devoria Glassing wrote: Reason for CRM: Left message for patient to schedule Annual Wellness Visit.  Please schedule with Nurse Health Advisor Madelyn Brunner, LPN at [facility]. This appt can be telephone or office visit.  Please call [phone] ask for [facility]

## 2022-01-23 ENCOUNTER — Ambulatory Visit (INDEPENDENT_AMBULATORY_CARE_PROVIDER_SITE_OTHER): Payer: Medicare HMO

## 2022-01-23 VITALS — Ht 61.0 in | Wt 127.0 lb

## 2022-01-23 DIAGNOSIS — Z0000 Encounter for general adult medical examination without abnormal findings: Secondary | ICD-10-CM

## 2022-01-23 NOTE — Progress Notes (Signed)
Subjective:   Kathleen Wallace is a 89 y.o. female who presents for Medicare Annual (Subsequent) preventive examination.  Review of Systems    No ROS.  Medicare Wellness Virtual Visit.  Visual/audio telehealth visit, UTA vital signs.   See social history for additional risk factors.   Cardiac Risk Factors include: advanced age (>59mn, >>98women)     Objective:    Today's Vitals   01/23/22 1440  Weight: 127 lb (57.6 kg)  Height: _0  (1.549 m)   Body mass index is 24 kg/m.     01/23/2022    2:40 PM 01/07/2021    9:53 AM 01/17/2020   11:09 PM 01/17/2020    6:27 PM 01/05/2020    9:59 AM 12/23/2019    9:11 AM 12/19/2019   11:07 AM  Advanced Directives  Does Patient Have a Medical Advance Directive? No Yes No No Yes No Yes  Type of ASocial research officer, governmentLiving will HHot Sulphur SpringsLiving will  HSt. FrancisLiving will  Living will  Does patient want to make changes to medical advance directive?  No - Patient declined No - Patient declined  No - Patient declined  No - Patient declined  Copy of [place]?  Yes - validated most recent copy scanned in chart (See row information) Yes - validated most recent copy scanned in chart (See row information)  Yes - validated most recent copy scanned in chart (See row information)    Would patient like information on creating a medical advance directive? No - Patient declined  No - Patient declined        Current Medications (verified) Outpatient Encounter Medications as of 01/23/2022  Medication Sig   amLODipine (NORVASC) 5 MG tablet Take 1 tablet (5 mg total) by mouth daily.   Coenzyme Q10 (COQ10) 100 MG CAPS Take 100 mg by mouth daily.    dorzolamidel-timolol (COSOPT) 22.3-6.8 MG/ML SOLN ophthalmic solution Place 1 drop into both eyes 2 (two) times daily.   ELIQUIS 2.5 MG TABS tablet TAKE ONE TABLET TWICE DAILY   latanoprost (XALATAN) 0.005 % ophthalmic  solution Place 1 drop into both eyes at bedtime.   Multiple Vitamins-Minerals (MULTIVITAMIN WITH MINERALS) tablet Take 1 tablet by mouth daily.    ROCKLATAN 0.02-0.005 % SOLN SMARTSIG:1 Drop(s) In Eye(s) Every Evening   vitamin C (ASCORBIC ACID) 500 MG tablet Take 500 mg by mouth daily.    No facility-administered encounter medications on file as of 01/23/2022.    Allergies (verified) Other, Codeine, and Amoxicillin   History: Past Medical History:  Diagnosis Date   [place]    skin cancer [place]   Complication of anesthesia    woke up during partial hysterectomy   Elevated blood pressure    GERD (gastroesophageal reflux disease)    occ-no meds   Headache    h/o    Heart murmur    asymptomatic   Past Surgical History:  Procedure Laterality Date   ABDOMINAL HYSTERECTOMY  1974   partial   BREAST SURGERY     IMPLANTS   CATARACT EXTRACTION W/PHACO Right 02/16/2018   Procedure: CATARACT EXTRACTION PHACO AND INTRAOCULAR LENS PLACEMENT ([place];  Surgeon: HMarchia Meiers MD;  Location: [facility] ORS;  Service: Ophthalmology;  Laterality: Right;  [place] 01:10 CDE 11.02 Fluid pack lot # [id number]   CATARACT EXTRACTION W/PHACO Left 03/18/2018   Procedure: CATARACT EXTRACTION PHACO AND INTRAOCULAR LENS PLACEMENT (IOC) LEFT;  Surgeon:  Marchia Meiers, MD;  Location: [facility] ORS;  Service: Ophthalmology;  Laterality: Left;  [place] 01:06.5 CDE 10.80 FLUID PACK LOT # [id number]   LUMBAR LAMINECTOMY/ DECOMPRESSION WITH MET-RX Bilateral 09/20/2018   Procedure: LUMBAR LAMINECTOMY/ DECOMPRESSION WITH MET-RX;  Surgeon: Deetta Perla, MD;  Location: [facility] ORS;  Service: Neurosurgery;  Laterality: Bilateral;   TONSILLECTOMY AND ADENOIDECTOMY  31   Family History  Problem Relation Age of Onset   Stroke Mother    Hypertension Mother    Stroke Father    Hypertension Father    Diabetes Other    Social History   Socioeconomic History   Marital status: Married    Spouse name: Not on file   Number of  children: Not on file   Years of education: Not on file   Highest education level: Not on file  Occupational History   Not on file  Tobacco Use   Smoking status: Never   Smokeless tobacco: Never  Vaping Use   Vaping Use: Never used  Substance and Sexual Activity   Alcohol use: No    Alcohol/week: 0.0 standard drinks of alcohol   Drug use: No   Sexual activity: Not on file  Other Topics Concern   Not on file  Social History Narrative   Not on file   Social Determinants of Health   Financial Resource Strain: Low Risk  (01/23/2022)   Overall Financial Resource Strain (CARDIA)    Difficulty of Paying Living Expenses: Not hard at all  Food Insecurity: No Food Insecurity (01/23/2022)   Hunger Vital Sign    Worried About Running Out of Food in the Last Year: Never true    [place] in the Last Year: Never true  Transportation Needs: No Transportation Needs (01/23/2022)   PRAPARE - Hydrologist (Medical): No    Lack of Transportation (Non-Medical): No  Physical Activity: Insufficiently Active (01/23/2022)   Exercise Vital Sign    Days of Exercise per Week: 4 days    Minutes of Exercise per Session: 20 min  Stress: No Stress Concern Present (01/23/2022)   [place]    Feeling of Stress : Not at all  Social Connections: Unknown (01/23/2022)   Social Connection and Isolation Panel [NHANES]    Frequency of Communication with Friends and Family: Not on file    Frequency of Social Gatherings with Friends and Family: Not on file    Attends Religious Services: Not on file    Active Member of Clubs or Organizations: Not on file    Attends Archivist Meetings: Not on file    Marital Status: Married    Tobacco Counseling Counseling given: Not Answered   Clinical Intake:  Pre-visit preparation completed: Yes        Diabetes: No  How often do you need to have someone help you  when you read instructions, pamphlets, or other written materials from your doctor or pharmacy?: 1 - Never    Interpreter Needed?: No      Activities of Daily Living    01/23/2022    4:08 PM  In your present state of health, do you have any difficulty performing the following activities:  Hearing? 0  Vision? 0  Difficulty concentrating or making decisions? 0  Walking or climbing stairs? 0  Dressing or bathing? 0  Doing errands, shopping? 0  Preparing Food and eating ? N  Using the Toilet? N  In  the past six months, have you accidently leaked urine? N  Do you have problems with loss of bowel control? N  Managing your Medications? N  Managing your Finances? N  Housekeeping or managing your Housekeeping? N    Patient Care Team: Einar Pheasant, MD as PCP - General (Internal Medicine)  Indicate any recent Medical Services you may have received from other than [facility] providers in the past year (date may be approximate).     Assessment:   This is a routine wellness examination for [facility].  I connected with  Cresenciano Lick Koestner on 01/23/22 by a audio enabled telemedicine application and verified that I am speaking with the correct person using two identifiers.  Patient Location: Home  Provider Location: Office/Clinic  I discussed the limitations of evaluation and management by telemedicine. The patient expressed understanding and agreed to proceed.   Hearing/Vision screen Hearing Screening - Comments:: Patient is able to hear conversational tones without difficulty.  No issues reported.   Vision Screening - Comments:: Followed by Dr. Ellin Mayhew Wears corrective lenses They have seen their ophthalmologist in the last 6 months. Glaucoma; drops in use.    Dietary issues and exercise activities discussed: Current Exercise Habits: Home exercise routine, Intensity: Mild   Goals Addressed             This Visit's Progress    Maintain healthy lifestyle       Stay active Stay  hydrated Healthy diet       Depression Screen    01/23/2022    2:45 PM 11/19/2021    8:07 AM 10/22/2021    7:16 AM 01/07/2021    9:52 AM 01/02/2021    7:06 AM 01/05/2020    9:47 AM 10/04/2019    3:57 PM  PHQ 2/9 Scores  PHQ - 2 Score 0 0 0 0 0 0 0    Fall Risk    01/23/2022    4:08 PM 11/19/2021    8:07 AM 10/22/2021    7:16 AM 01/07/2021    9:56 AM 01/02/2021    7:06 AM  Fall Risk   Falls in the past year? 0 0 0 0 0  Number falls in past yr: 0 0  0 0  Injury with Fall? 0 0   0  Risk for fall due to :  No Fall Risks No Fall Risks  No Fall Risks  Follow up Falls evaluation completed;Falls prevention discussed Falls evaluation completed Falls evaluation completed Falls evaluation completed Falls evaluation completed    FALL RISK PREVENTION PERTAINING TO THE HOME: Home free of loose throw rugs in walkways, pet beds, electrical cords, etc? Yes  Adequate lighting in your home to reduce risk of falls? Yes   ASSISTIVE DEVICES UTILIZED TO PREVENT FALLS: Life alert? No  Use of a cane, walker or w/c? No   TIMED UP AND GO: Was the test performed? No .    Cognitive Function:  Patient is alert and oriented x3.  Manages household finances and her own medications.  100% independent.       01/23/2022    4:09 PM 01/05/2020    9:55 AM 01/04/2019    9:57 AM 12/31/2017    8:54 AM  6CIT Screen  What Year? 0 points 0 points 0 points 0 points  What month? 0 points 0 points 0 points 0 points  What time? 0 points 0 points 0 points 0 points  Count back from 20  0 points 0 points 0 points  Months in reverse  0 points 0 points 0 points  Repeat phrase  0 points 0 points 0 points  Total Score  0 points 0 points 0 points    Immunizations Immunization History  Administered Date(s) Administered   Moderna Sars-Covid-2 Vaccination 03/12/2019, 04/13/2019   TDAP status: Due, Education has been provided regarding the importance of this vaccine. Advised may receive this vaccine at local  pharmacy or Health Dept. Aware to provide a copy of the vaccination record if obtained from local pharmacy or Health Dept. Verbalized acceptance and understanding.  Pneumococcal vaccine status: Due, Education has been provided regarding the importance of this vaccine. Advised may receive this vaccine at local pharmacy or Health Dept. Aware to provide a copy of the vaccination record if obtained from local pharmacy or Health Dept. Verbalized acceptance and understanding.  Screening Tests Health Maintenance  Topic Date Due   DTaP/Tdap/Td (1 - Tdap) Never done   Pneumonia Vaccine 30+ Years old (1 - PCV) Never done   INFLUENZA VACCINE  04/20/2022 (Originally 08/20/2021)   Zoster Vaccines- Shingrix (1 of 2) 04/24/2022 (Originally 08/18/1952)   Medicare Annual Wellness (AWV)  01/24/2023   HPV VACCINES  Aged Out   DEXA SCAN  Discontinued   COVID-19 Vaccine  Discontinued   Health Maintenance Health Maintenance Due  Topic Date Due   DTaP/Tdap/Td (1 - Tdap) Never done   Pneumonia Vaccine 38+ Years old (1 - PCV) Never done    Lung Cancer Screening: (Low Dose CT Chest recommended if Age 22-80 years, 30 pack-year currently smoking OR have quit w/in 15years.) does not qualify.   Hepatitis C Screening: does not qualify.  Vision Screening: Recommended annual ophthalmology exams for early detection of glaucoma and other disorders of the eye.  Dental Screening: Recommended annual dental exams for proper oral hygiene.  Community Resource Referral / Chronic Care Management: CRR required this visit?  No   CCM required this visit?  No      Plan:     I have personally reviewed and noted the following in the patient's chart:   Medical and social history Use of alcohol, tobacco or illicit drugs  Current medications and supplements including opioid prescriptions. Patient is not currently taking opioid prescriptions. Functional ability and status Nutritional status Physical activity Advanced  directives List of other physicians Hospitalizations, surgeries, and ER visits in previous 12 months Vitals Screenings to include cognitive, depression, and falls Referrals and appointments  In addition, I have reviewed and discussed with patient certain preventive protocols, quality metrics, and best practice recommendations. A written personalized care plan for preventive services as well as general preventive health recommendations were provided to patient.     Leta Jungling, LPN   2/0/2542

## 2022-01-23 NOTE — Patient Instructions (Addendum)
Kathleen Wallace , Thank you for taking time to come for your Medicare Wellness Visit. I appreciate your ongoing commitment to your health goals. Please review the following plan we discussed and let me know if I can assist you in the future.   These are the goals we discussed:  Goals Addressed             This Visit's Progress    Maintain healthy lifestyle       Stay active Stay hydrated Healthy diet         This is a list of the screening recommended for you and due dates:  Health Maintenance  Topic Date Due   DTaP/Tdap/Td vaccine (1 - Tdap) Never done   Pneumonia Vaccine (1 - PCV) Never done   Flu Shot  04/20/2022*   Zoster (Shingles) Vaccine (1 of 2) 04/24/2022*   Medicare Annual Wellness Visit  01/24/2023   HPV Vaccine  Aged Out   DEXA scan (bone density measurement)  Discontinued   COVID-19 Vaccine  Discontinued  *Topic was postponed. The date shown is not the original due date.   Conditions/risks identified: none new.  Next appointment: Follow up in one year for your annual wellness visit.   Preventive Care 66 Years and Older, Female Preventive care refers to lifestyle choices and visits with your health care provider that can promote health and wellness. What does preventive care include? A yearly physical exam. This is also called an annual well check. Dental exams once or twice a year. Routine eye exams. Ask your health care provider how often you should have your eyes checked. Personal lifestyle choices, including: Daily care of your teeth and gums. Regular physical activity. Eating a healthy diet. Avoiding tobacco and drug use. Limiting alcohol use. Practicing safe sex. Taking low-dose aspirin every day. Taking vitamin and mineral supplements as recommended by your health care provider. What happens during an annual well check? The services and screenings done by your health care provider during your annual well check will depend on your age, overall health,  lifestyle risk factors, and family history of disease. Counseling  Your health care provider may ask you questions about your: Alcohol use. Tobacco use. Drug use. Emotional well-being. Home and relationship well-being. Sexual activity. Eating habits. History of falls. Memory and ability to understand (cognition). Work and work Statistician. Reproductive health. Screening  You may have the following tests or measurements: Height, weight, and BMI. Blood pressure. Lipid and cholesterol levels. These may be checked every 5 years, or more frequently if you are over 70 years old. Skin check. Lung cancer screening. You may have this screening every year starting at age 14 if you have a 30-pack-year history of smoking and currently smoke or have quit within the past 15 years. Fecal occult blood test (FOBT) of the stool. You may have this test every year starting at age 67. Flexible sigmoidoscopy or colonoscopy. You may have a sigmoidoscopy every 5 years or a colonoscopy every 10 years starting at age 44. Hepatitis C blood test. Hepatitis B blood test. Sexually transmitted disease (STD) testing. Diabetes screening. This is done by checking your blood sugar (glucose) after you have not eaten for a while (fasting). You may have this done every 1-3 years. Bone density scan. This is done to screen for osteoporosis. You may have this done starting at age 38. Mammogram. This may be done every 1-2 years. Talk to your health care provider about how often you should have regular mammograms.  Talk with your health care provider about your test results, treatment options, and if necessary, the need for more tests. Vaccines  Your health care provider may recommend certain vaccines, such as: Influenza vaccine. This is recommended every year. Tetanus, diphtheria, and acellular pertussis (Tdap, Td) vaccine. You may need a Td booster every 10 years. Zoster vaccine. You may need this after age  33. Pneumococcal 13-valent conjugate (PCV13) vaccine. One dose is recommended after age 41. Pneumococcal polysaccharide (PPSV23) vaccine. One dose is recommended after age 65. Talk to your health care provider about which screenings and vaccines you need and how often you need them. This information is not intended to replace advice given to you by your health care provider. Make sure you discuss any questions you have with your health care provider. Document Released: 02/02/2015 Document Revised: 09/26/2015 Document Reviewed: 11/07/2014 Elsevier Interactive Patient Education  2017 [place] Prevention in the Home Falls can cause injuries. They can happen to people of all ages. There are many things you can do to make your home safe and to help prevent falls. What can I do on the outside of my home? Regularly fix the edges of walkways and driveways and fix any cracks. Remove anything that might make you trip as you walk through a door, such as a raised step or threshold. Trim any bushes or trees on the path to your home. Use bright outdoor lighting. Clear any walking paths of anything that might make someone trip, such as rocks or tools. Regularly check to see if handrails are loose or broken. Make sure that both sides of any steps have handrails. Any raised decks and porches should have guardrails on the edges. Have any leaves, snow, or ice cleared regularly. Use sand or salt on walking paths during winter. Clean up any spills in your garage right away. This includes oil or grease spills. What can I do in the bathroom? Use night lights. Install grab bars by the toilet and in the tub and shower. Do not use towel bars as grab bars. Use non-skid mats or decals in the tub or shower. If you need to sit down in the shower, use a plastic, non-slip stool. Keep the floor dry. Clean up any water that spills on the floor as soon as it happens. Remove soap buildup in the tub or shower  regularly. Attach bath mats securely with double-sided non-slip rug tape. Do not have throw rugs and other things on the floor that can make you trip. What can I do in the bedroom? Use night lights. Make sure that you have a light by your bed that is easy to reach. Do not use any sheets or blankets that are too big for your bed. They should not hang down onto the floor. Have a firm chair that has side arms. You can use this for support while you get dressed. Do not have throw rugs and other things on the floor that can make you trip. What can I do in the kitchen? Clean up any spills right away. Avoid walking on wet floors. Keep items that you use a lot in easy-to-reach places. If you need to reach something above you, use a strong step stool that has a grab bar. Keep electrical cords out of the way. Do not use floor polish or wax that makes floors slippery. If you must use wax, use non-skid floor wax. Do not have throw rugs and other things on the floor that can make  you trip. What can I do with my stairs? Do not leave any items on the stairs. Make sure that there are handrails on both sides of the stairs and use them. Fix handrails that are broken or loose. Make sure that handrails are as long as the stairways. Check any carpeting to make sure that it is firmly attached to the stairs. Fix any carpet that is loose or worn. Avoid having throw rugs at the top or bottom of the stairs. If you do have throw rugs, attach them to the floor with carpet tape. Make sure that you have a light switch at the top of the stairs and the bottom of the stairs. If you do not have them, ask someone to add them for you. What else can I do to help prevent falls? Wear shoes that: Do not have high heels. Have rubber bottoms. Are comfortable and fit you well. Are closed at the toe. Do not wear sandals. If you use a stepladder: Make sure that it is fully opened. Do not climb a closed stepladder. Make sure that  both sides of the stepladder are locked into place. Ask someone to hold it for you, if possible. Clearly mark and make sure that you can see: Any grab bars or handrails. First and last steps. Where the edge of each step is. Use tools that help you move around (mobility aids) if they are needed. These include: Canes. Walkers. Scooters. Crutches. Turn on the lights when you go into a dark area. Replace any light bulbs as soon as they burn out. Set up your furniture so you have a clear path. Avoid moving your furniture around. If any of your floors are uneven, fix them. If there are any pets around you, be aware of where they are. Review your medicines with your doctor. Some medicines can make you feel dizzy. This can increase your chance of falling. Ask your doctor what other things that you can do to help prevent falls. This information is not intended to replace advice given to you by your health care provider. Make sure you discuss any questions you have with your health care provider. Document Released: 11/02/2008 Document Revised: 06/14/2015 Document Reviewed: 02/10/2014 Elsevier Interactive Patient Education  2017 [employer].

## 2022-02-04 ENCOUNTER — Other Ambulatory Visit: Payer: Self-pay | Admitting: Internal Medicine

## 2022-02-04 DIAGNOSIS — H3581 Retinal edema: Secondary | ICD-10-CM | POA: Diagnosis not present

## 2022-02-04 DIAGNOSIS — H401133 Primary open-angle glaucoma, bilateral, severe stage: Secondary | ICD-10-CM | POA: Diagnosis not present

## 2022-02-04 DIAGNOSIS — Z961 Presence of intraocular lens: Secondary | ICD-10-CM | POA: Diagnosis not present

## 2022-02-04 DIAGNOSIS — H35372 Puckering of macula, left eye: Secondary | ICD-10-CM | POA: Diagnosis not present

## 2022-02-06 NOTE — Telephone Encounter (Signed)
Rx ok'd for eliquis #60 with 2 refills.

## 2022-02-12 ENCOUNTER — Other Ambulatory Visit: Payer: Self-pay | Admitting: Internal Medicine

## 2022-02-12 ENCOUNTER — Ambulatory Visit (INDEPENDENT_AMBULATORY_CARE_PROVIDER_SITE_OTHER): Payer: Medicare HMO | Admitting: Internal Medicine

## 2022-02-12 ENCOUNTER — Encounter: Payer: Self-pay | Admitting: Internal Medicine

## 2022-02-12 VITALS — BP 130/70 | HR 84 | Temp 97.9°F | Resp 16 | Ht 61.0 in | Wt 130.4 lb

## 2022-02-12 DIAGNOSIS — R011 Cardiac murmur, unspecified: Secondary | ICD-10-CM

## 2022-02-12 DIAGNOSIS — R69 Illness, unspecified: Secondary | ICD-10-CM | POA: Diagnosis not present

## 2022-02-12 DIAGNOSIS — E7800 Pure hypercholesterolemia, unspecified: Secondary | ICD-10-CM

## 2022-02-12 DIAGNOSIS — I10 Essential (primary) hypertension: Secondary | ICD-10-CM

## 2022-02-12 DIAGNOSIS — F439 Reaction to severe stress, unspecified: Secondary | ICD-10-CM | POA: Diagnosis not present

## 2022-02-12 DIAGNOSIS — R739 Hyperglycemia, unspecified: Secondary | ICD-10-CM | POA: Diagnosis not present

## 2022-02-12 DIAGNOSIS — I4891 Unspecified atrial fibrillation: Secondary | ICD-10-CM | POA: Diagnosis not present

## 2022-02-12 DIAGNOSIS — E079 Disorder of thyroid, unspecified: Secondary | ICD-10-CM | POA: Diagnosis not present

## 2022-02-12 LAB — BASIC METABOLIC PANEL
BUN: 16 mg/dL (ref 6–23)
CO2: 25 mEq/L (ref 19–32)
Calcium: 9 mg/dL (ref 8.4–10.5)
Chloride: 105 mEq/L (ref 96–112)
Creatinine, Ser: 0.73 mg/dL (ref 0.40–1.20)
GFR: 74.43 mL/min (ref >60.00)
Glucose, Bld: 93 mg/dL (ref 70–99)
Potassium: 4.7 mEq/L (ref 3.5–5.1)
Sodium: 139 mEq/L (ref 135–145)

## 2022-02-12 LAB — LIPID PANEL
Cholesterol: 313 mg/dL — ABNORMAL HIGH (ref 0–200)
HDL: 69.9 mg/dL (ref >39.00)
LDL Cholesterol: 220 mg/dL — ABNORMAL HIGH (ref 0–99)
NonHDL: 242.75
Total CHOL/HDL Ratio: 4
Triglycerides: 112 mg/dL (ref 0.0–149.0)
VLDL: 22.4 mg/dL (ref 0.0–40.0)

## 2022-02-12 LAB — TSH: TSH: 1.88 u[IU]/mL (ref 0.35–5.50)

## 2022-02-12 LAB — HEMOGLOBIN A1C: Hgb A1c MFr Bld: 5.8 % (ref 4.6–6.5)

## 2022-02-12 LAB — HEPATIC FUNCTION PANEL
ALT: 15 U/L (ref 0–35)
AST: 16 U/L (ref 0–37)
Albumin: 4.4 g/dL (ref 3.5–5.2)
Alkaline Phosphatase: 59 U/L (ref 39–117)
Bilirubin, Direct: 0.1 mg/dL (ref 0.0–0.3)
Total Bilirubin: 0.6 mg/dL (ref 0.2–1.2)
Total Protein: 6.5 g/dL (ref 6.0–8.3)

## 2022-02-12 NOTE — Progress Notes (Signed)
[name], female   DOB: Oct 01, 1933, 89 y.o.   MRN: [id number]   Subjective:    [name], female    DOB: 1933-08-06, 89 y.o.   MRN: [id number]   Patient here for  Chief Complaint  Patient presents with   Medical Management of Chronic Issues   .   HPI Here to follow up regarding her blood pressure.  On amlodipine.  Brings in recording outside blood pressures - most averaging 120-130s/70-80s.  Stays active.  No chest pain or sob reported.  No abdominal pain or bowel change reported.  Tolerating medication.  Continues on eliquis - afib.      Past Medical History:  Diagnosis Date   Cancer ([place])    skin cancer nose   Complication of anesthesia    woke up during partial hysterectomy   Elevated blood pressure    GERD (gastroesophageal reflux disease)    occ-no meds   Headache    h/o    Heart murmur    asymptomatic   Past Surgical History:  Procedure Laterality Date   ABDOMINAL HYSTERECTOMY  1974   partial   BREAST SURGERY     IMPLANTS   CATARACT EXTRACTION W/PHACO Right 02/16/2018   Procedure: CATARACT EXTRACTION PHACO AND INTRAOCULAR LENS PLACEMENT ([place]) RIGHT;  Surgeon: Marchia Meiers, MD;  Location: [facility] ORS;  Service: Ophthalmology;  Laterality: Right;  [place]  01:10 CDE 11.02 Fluid pack lot # [id number]   CATARACT EXTRACTION W/PHACO Left 03/18/2018   Procedure: CATARACT EXTRACTION PHACO AND INTRAOCULAR LENS PLACEMENT ([place]) LEFT;  Surgeon: Marchia Meiers, MD;  Location: [facility];  Service: Ophthalmology;  Laterality: Left;  [place] 01:06.5 CDE 10.80 FLUID PACK LOT # [id number]   LUMBAR LAMINECTOMY/ DECOMPRESSION WITH MET-RX Bilateral 09/20/2018   Procedure: LUMBAR LAMINECTOMY/ DECOMPRESSION WITH MET-RX;  Surgeon: Deetta Perla, MD;  Location: [facility];  Service: Neurosurgery;  Laterality: Bilateral;   TONSILLECTOMY AND ADENOIDECTOMY  75   Family History  Problem Relation Age of Onset   Stroke Mother    Hypertension Mother    Stroke Father    Hypertension  Father    Diabetes Other    Social History   Socioeconomic History   Marital status: Married    Spouse name: Not on file   Number of children: Not on file   Years of education: Not on file   Highest education level: Not on file  Occupational History   Not on file  Tobacco Use   Smoking status: Never   Smokeless tobacco: Never  Vaping Use   Vaping Use: Never used  Substance and Sexual Activity   Alcohol use: No    Alcohol/week: 0.0 standard drinks of alcohol   Drug use: No   Sexual activity: Not on file  Other Topics Concern   Not on file  Social History Narrative   Not on file   Social Determinants of Health   Financial Resource Strain: Low Risk  (01/23/2022)   Overall Financial Resource Strain (CARDIA)    Difficulty of Paying Living Expenses: Not hard at all  Food Insecurity: No Food Insecurity (01/23/2022)   Hunger Vital Sign    Worried About Running Out of Food in the Last Year: Never true    [place] in the Last Year: Never true  Transportation Needs: No Transportation Needs (01/23/2022)   PRAPARE - Hydrologist (Medical): No    Lack of Transportation (Non-Medical): No  Physical Activity:  Insufficiently Active (01/23/2022)   Exercise Vital Sign    Days of Exercise per Week: 4 days    Minutes of Exercise per Session: 20 min  Stress: No Stress Concern Present (01/23/2022)   [place]    Feeling of Stress : Not at all  Social Connections: Unknown (01/23/2022)   Social Connection and Isolation Panel [NHANES]    Frequency of Communication with Friends and Family: Not on file    Frequency of Social Gatherings with Friends and Family: Not on file    Attends Religious Services: Not on file    Active Member of Clubs or Organizations: Not on file    Attends Archivist Meetings: Not on file    Marital Status: Married     Review of Systems  Constitutional:  Negative  for appetite change and unexpected weight change.  HENT:  Negative for congestion and sinus pressure.   Respiratory:  Negative for cough, chest tightness and shortness of breath.   Cardiovascular:  Negative for chest pain, palpitations and leg swelling.  Gastrointestinal:  Negative for abdominal pain, diarrhea, nausea and vomiting.  Genitourinary:  Negative for difficulty urinating and dysuria.  Musculoskeletal:  Negative for joint swelling and myalgias.  Skin:  Negative for color change and rash.  Neurological:  Negative for dizziness, light-headedness and headaches.  Psychiatric/Behavioral:  Negative for agitation and dysphoric mood.        Objective:     BP 130/70   Pulse 84   Temp 97.9 F (36.6 C)   Resp 16   Ht '5\' 1"'$  (1.549 m)   Wt 130 lb 6.4 oz (59.1 kg)   SpO2 99%   BMI 24.64 kg/m  Wt Readings from Last 3 Encounters:  02/12/22 130 lb 6.4 oz (59.1 kg)  01/23/22 127 lb (57.6 kg)  11/19/21 127 lb 12.8 oz (58 kg)    Physical Exam Vitals reviewed.  Constitutional:      General: She is not in acute distress.    Appearance: Normal appearance.  HENT:     Head: Normocephalic and atraumatic.     Right Ear: External ear normal.     Left Ear: External ear normal.  Eyes:     General: No scleral icterus.       Right eye: No discharge.        Left eye: No discharge.     Conjunctiva/sclera: Conjunctivae normal.  Neck:     Thyroid: No thyromegaly.  Cardiovascular:     Rate and Rhythm: Normal rate and regular rhythm.  Pulmonary:     Effort: No respiratory distress.     Breath sounds: Normal breath sounds. No wheezing.  Abdominal:     General: Bowel sounds are normal.     Palpations: Abdomen is soft.     Tenderness: There is no abdominal tenderness.  Musculoskeletal:        General: No swelling or tenderness.     Cervical back: Neck supple. No tenderness.  Lymphadenopathy:     Cervical: No cervical adenopathy.  Skin:    Findings: No erythema or rash.  Neurological:      Mental Status: She is alert.  Psychiatric:        Mood and Affect: Mood normal.        Behavior: Behavior normal.      Outpatient Encounter Medications as of 02/12/2022  Medication Sig   apixaban (ELIQUIS) 2.5 MG TABS tablet TAKE ONE TABLET TWICE DAILY  Coenzyme Q10 (COQ10) 100 MG CAPS Take 100 mg by mouth daily.    dorzolamidel-timolol (COSOPT) 22.3-6.8 MG/ML SOLN ophthalmic solution Place 1 drop into both eyes 2 (two) times daily.   latanoprost (XALATAN) 0.005 % ophthalmic solution Place 1 drop into both eyes at bedtime.   Multiple Vitamins-Minerals (MULTIVITAMIN WITH MINERALS) tablet Take 1 tablet by mouth daily.    ROCKLATAN 0.02-0.005 % SOLN SMARTSIG:1 Drop(s) In Eye(s) Every Evening   vitamin C (ASCORBIC ACID) 500 MG tablet Take 500 mg by mouth daily.    [DISCONTINUED] amLODipine (NORVASC) 5 MG tablet Take 1 tablet (5 mg total) by mouth daily.   No facility-administered encounter medications on file as of 02/12/2022.     Lab Results  Component Value Date   WBC 6.3 10/22/2021   HGB 13.1 10/22/2021   HCT 38.8 10/22/2021   PLT 279 10/22/2021   GLUCOSE 93 02/12/2022   CHOL 313 (H) 02/12/2022   TRIG 112.0 02/12/2022   HDL 69.90 02/12/2022   LDLDIRECT 192.0 02/13/2020   LDLCALC 220 (H) 02/12/2022   ALT 15 02/12/2022   AST 16 02/12/2022   NA 139 02/12/2022   K 4.7 02/12/2022   CL 105 02/12/2022   CREATININE 0.73 02/12/2022   BUN 16 02/12/2022   CO2 25 02/12/2022   TSH 1.88 02/12/2022   INR 1.1 01/17/2020   HGBA1C 5.8 02/12/2022    DG Chest Port 1 View  Result Date: 01/19/2020 CLINICAL DATA:  Dyspnea EXAM: PORTABLE CHEST 1 VIEW COMPARISON:  01/17/2020 FINDINGS: Lung volumes are small, but are symmetric and pulmonary insufflation remain stable. Superimposed multifocal pulmonary infiltrate is again identified and has progressed, particularly within the right upper lobe. No pneumothorax or pleural effusion. Cardiac size within normal limits. Bilateral breast implants  noted. No acute bone abnormality. IMPRESSION: Progressive multifocal pulmonary infiltrates, more focal within the right upper lobe, in keeping with changes of atypical infection in the acute setting. Electronically Signed   By: Fidela Salisbury MD   On: 01/19/2020 05:58   ECHOCARDIOGRAM COMPLETE  Result Date: 01/18/2020    ECHOCARDIOGRAM REPORT   Patient Name:   Kathleen Wallace Date of Exam: 01/18/2020 Medical Rec #:  [id number]    Height:       61.0 in Accession #:    [id number]   Weight:       120.0 lb Date of Birth:  [birth year]    BSA:          1.520 m Patient Age:    34 years     BP:           126/60 mmHg Patient Gender: F            HR:           80 bpm. Exam Location:  [facility] Procedure: 2D Echo, Color Doppler, Cardiac Doppler and Strain Analysis Indications:     I48.91 Atrial fibrillation  History:         Patient has no prior history of Echocardiogram examinations.  Sonographer:     Charmayne Sheer RDCS (AE) Referring Phys:  [id number] Arvella Merles [place] Diagnosing Phys: Bartholome Bill MD  Sonographer Comments: Suboptimal parasternal window. Image acquisition challenging due to breast implants. Global longitudinal strain was attempted. IMPRESSIONS  1. Left ventricular ejection fraction, by estimation, is 55 to 60%. The left ventricle has normal function. The left ventricle has no regional wall motion abnormalities. There is moderate left ventricular hypertrophy. Left ventricular diastolic parameters are consistent with Grade I diastolic  dysfunction (impaired relaxation).  2. Right ventricular systolic function is normal. The right ventricular size is normal.  3. The mitral valve is grossly normal. Trivial mitral valve regurgitation.  4. The aortic valve is grossly normal. Aortic valve regurgitation is trivial. FINDINGS  Left Ventricle: Left ventricular ejection fraction, by estimation, is 55 to 60%. The left ventricle has normal function. The left ventricle has no regional wall motion abnormalities. The left ventricular internal  cavity size was normal in size. There is  moderate left ventricular hypertrophy. Left ventricular diastolic parameters are consistent with Grade I diastolic dysfunction (impaired relaxation). Right Ventricle: The right ventricular size is normal. No increase in right ventricular wall thickness. Right ventricular systolic function is normal. Left Atrium: Left atrial size was normal in size. Right Atrium: Right atrial size was normal in size. Pericardium: There is no evidence of pericardial effusion. Mitral Valve: The mitral valve is grossly normal. Trivial mitral valve regurgitation. MV peak gradient, 6.2 mmHg. The mean mitral valve gradient is 3.0 mmHg. Tricuspid Valve: The tricuspid valve is grossly normal. Tricuspid valve regurgitation is trivial. Aortic Valve: The aortic valve is grossly normal. Aortic valve regurgitation is trivial. Aortic valve mean gradient measures 9.0 mmHg. Aortic valve peak gradient measures 14.7 mmHg. Aortic valve area, by VTI measures 1.18 cm. Pulmonic Valve: The pulmonic valve was not assessed. Pulmonic valve regurgitation is not visualized. Aorta: The aortic root is normal in size and structure. IAS/Shunts: The interatrial septum was not assessed.  LEFT VENTRICLE PLAX 2D LVIDd:         4.07 cm  Diastology LVIDs:         2.85 cm  LV e' medial:    5.33 cm/s LV PW:         1.05 cm  LV E/e' medial:  17.3 LV IVS:        0.88 cm  LV e' lateral:   6.53 cm/s LVOT diam:     1.90 cm  LV E/e' lateral: 14.1 LV SV:         46 LV SV Index:   31 LVOT Area:     2.84 cm  RIGHT VENTRICLE RV Basal diam:  2.52 cm LEFT ATRIUM             Index       RIGHT ATRIUM           Index LA Vol (A2C):   24.7 ml 16.25 ml/m RA Area:     10.90 cm LA Vol (A4C):   25.8 ml 16.97 ml/m RA Volume:   26.80 ml  17.63 ml/m LA Biplane Vol: 27.1 ml 17.83 ml/m  AORTIC VALVE                    PULMONIC VALVE AV Area (Vmax):    1.31 cm     PV Vmax:       0.85 m/s AV Area (Vmean):   1.32 cm     PV Vmean:      60.100 cm/s AV  Area (VTI):     1.18 cm     PV VTI:        0.150 m AV Vmax:           192.00 cm/s  PV Peak grad:  2.9 mmHg AV Vmean:          137.000 cm/s PV Mean grad:  2.0 mmHg AV VTI:            0.395 m AV Peak Grad:  14.7 mmHg AV Mean Grad:      9.0 mmHg LVOT Vmax:         88.80 cm/s LVOT Vmean:        63.800 cm/s LVOT VTI:          0.164 m LVOT/AV VTI ratio: 0.42  AORTA Ao Root diam: 2.60 cm MITRAL VALVE MV Area (PHT): 4.19 cm     SHUNTS MV Peak grad:  6.2 mmHg     Systemic VTI:  0.16 m MV Mean grad:  3.0 mmHg     Systemic Diam: 1.90 cm MV Vmax:       1.25 m/s MV Vmean:      77.4 cm/s MV Decel Time: 181 msec MV E velocity: 92.10 cm/s MV A velocity: 131.00 cm/s MV E/A ratio:  0.70 Bartholome Bill MD Electronically signed by Bartholome Bill MD Signature Date/Time: 01/18/2020/4:55:43 PM    Final        Assessment & Plan:   Problem List Items Addressed This Visit     Thyroid mass    Thyroid mass noted on previous scan. Have discussed further w/up and follow up. She has declined.  Follow.       Stress    Overall appears to be handling things relatively well.  Follow.       Hyperglycemia    Low carb diet and exercise.  Follow met b and a1c.       Relevant Orders   Hemoglobin A1c (Completed)   Hypercholesterolemia    Have discussed recommendation to start cholesterol medication.  She declines.  Low cholesterol diet and exercise.  Follow lipid panel.       Relevant Orders   Hepatic function panel (Completed)   Lipid panel (Completed)   Heart murmur    Had echo - no significant valve abnormality.  Currently asymptomatic.        Essential hypertension - Primary    On amlodipine '5mg'$  q day.  Not taking regularly.  Discussed the need for her to take her medication regularly.  Blood pressure checks outside better as outlined.  Recorded outside reading average - 130/70.  Follow pressures.  Follow metabolic panel.  Send in readings.       Relevant Orders   Basic metabolic panel (Completed)   A-fib ([place])     Denies any increased heart rate or palpitations.  Continues on eliquis.  No chest pain or sob.        Relevant Orders   TSH (Completed)    Einar Pheasant, MD

## 2022-02-16 ENCOUNTER — Encounter: Payer: Self-pay | Admitting: Internal Medicine

## 2022-02-16 NOTE — Assessment & Plan Note (Signed)
Thyroid mass noted on previous scan. Have discussed further w/up and follow up. She has declined.  Follow.

## 2022-02-16 NOTE — Assessment & Plan Note (Signed)
Overall appears to be handling things relatively well.  Follow.   

## 2022-02-16 NOTE — Assessment & Plan Note (Signed)
Had echo - no significant valve abnormality.  Currently asymptomatic.

## 2022-02-16 NOTE — Assessment & Plan Note (Signed)
Low carb diet and exercise.  Follow met b and a1c.  

## 2022-02-16 NOTE — Assessment & Plan Note (Signed)
Denies any increased heart rate or palpitations.  Continues on eliquis.  No chest pain or sob.

## 2022-02-16 NOTE — Assessment & Plan Note (Signed)
Have discussed recommendation to start cholesterol medication.  She declines.  Low cholesterol diet and exercise.  Follow lipid panel.

## 2022-02-16 NOTE — Assessment & Plan Note (Signed)
On amlodipine '5mg'$  q day.  Not taking regularly.  Discussed the need for her to take her medication regularly.  Blood pressure checks outside better as outlined.  Recorded outside reading average - 130/70.  Follow pressures.  Follow metabolic panel.  Send in readings.

## 2022-05-19 DIAGNOSIS — R053 Chronic cough: Secondary | ICD-10-CM | POA: Diagnosis not present

## 2022-05-19 DIAGNOSIS — R059 Cough, unspecified: Secondary | ICD-10-CM | POA: Diagnosis not present

## 2022-05-19 DIAGNOSIS — J9801 Acute bronchospasm: Secondary | ICD-10-CM | POA: Diagnosis not present

## 2022-06-04 DIAGNOSIS — H3581 Retinal edema: Secondary | ICD-10-CM | POA: Diagnosis not present

## 2022-06-04 DIAGNOSIS — H35372 Puckering of macula, left eye: Secondary | ICD-10-CM | POA: Diagnosis not present

## 2022-06-04 DIAGNOSIS — H401133 Primary open-angle glaucoma, bilateral, severe stage: Secondary | ICD-10-CM | POA: Diagnosis not present

## 2022-06-04 DIAGNOSIS — Z961 Presence of intraocular lens: Secondary | ICD-10-CM | POA: Diagnosis not present

## 2022-06-12 ENCOUNTER — Other Ambulatory Visit: Payer: Self-pay | Admitting: Internal Medicine

## 2022-07-04 ENCOUNTER — Ambulatory Visit (INDEPENDENT_AMBULATORY_CARE_PROVIDER_SITE_OTHER): Payer: Medicare HMO | Admitting: Internal Medicine

## 2022-07-04 VITALS — BP 128/70 | HR 74 | Temp 98.0°F | Resp 16 | Ht 61.0 in | Wt 125.0 lb

## 2022-07-04 DIAGNOSIS — E079 Disorder of thyroid, unspecified: Secondary | ICD-10-CM | POA: Diagnosis not present

## 2022-07-04 DIAGNOSIS — E7800 Pure hypercholesterolemia, unspecified: Secondary | ICD-10-CM | POA: Diagnosis not present

## 2022-07-04 DIAGNOSIS — I4891 Unspecified atrial fibrillation: Secondary | ICD-10-CM | POA: Diagnosis not present

## 2022-07-04 DIAGNOSIS — R739 Hyperglycemia, unspecified: Secondary | ICD-10-CM

## 2022-07-04 DIAGNOSIS — R9389 Abnormal findings on diagnostic imaging of other specified body structures: Secondary | ICD-10-CM

## 2022-07-04 DIAGNOSIS — Z0000 Encounter for general adult medical examination without abnormal findings: Secondary | ICD-10-CM | POA: Diagnosis not present

## 2022-07-04 DIAGNOSIS — I10 Essential (primary) hypertension: Secondary | ICD-10-CM

## 2022-07-04 LAB — BASIC METABOLIC PANEL
BUN: 20 mg/dL (ref 6–23)
CO2: 25 mEq/L (ref 19–32)
Calcium: 9 mg/dL (ref 8.4–10.5)
Chloride: 107 mEq/L (ref 96–112)
Creatinine, Ser: 0.77 mg/dL (ref 0.40–1.20)
GFR: 69.62 mL/min (ref >60.00)
Glucose, Bld: 105 mg/dL — ABNORMAL HIGH (ref 70–99)
Potassium: 4.5 mEq/L (ref 3.5–5.1)
Sodium: 141 mEq/L (ref 135–145)

## 2022-07-04 LAB — LIPID PANEL
Cholesterol: 273 mg/dL — ABNORMAL HIGH (ref 0–200)
HDL: 62.3 mg/dL (ref >39.00)
LDL Cholesterol: 195 mg/dL — ABNORMAL HIGH (ref 0–99)
NonHDL: 210.46
Total CHOL/HDL Ratio: 4
Triglycerides: 79 mg/dL (ref 0.0–149.0)
VLDL: 15.8 mg/dL (ref 0.0–40.0)

## 2022-07-04 LAB — HEPATIC FUNCTION PANEL
ALT: 15 U/L (ref 0–35)
AST: 18 U/L (ref 0–37)
Albumin: 4.3 g/dL (ref 3.5–5.2)
Alkaline Phosphatase: 52 U/L (ref 39–117)
Bilirubin, Direct: 0.1 mg/dL (ref 0.0–0.3)
Total Bilirubin: 0.8 mg/dL (ref 0.2–1.2)
Total Protein: 6.7 g/dL (ref 6.0–8.3)

## 2022-07-04 LAB — CBC WITH DIFFERENTIAL/PLATELET
Basophils Absolute: 0 10*3/uL (ref 0.0–0.1)
Basophils Relative: 0.7 % (ref 0.0–3.0)
Eosinophils Absolute: 0.4 10*3/uL (ref 0.0–0.7)
Eosinophils Relative: 5.9 % — ABNORMAL HIGH (ref 0.0–5.0)
HCT: 35.4 % — ABNORMAL LOW (ref 36.0–46.0)
Hemoglobin: 12.5 g/dL (ref 12.0–15.0)
Lymphocytes Relative: 31.3 % (ref 12.0–46.0)
Lymphs Abs: 1.9 10*3/uL (ref 0.7–4.0)
MCHC: 35.2 g/dL (ref 30.0–36.0)
MCV: 101.5 fl — ABNORMAL HIGH (ref 78.0–100.0)
Monocytes Absolute: 0.5 10*3/uL (ref 0.1–1.0)
Monocytes Relative: 7.5 % (ref 3.0–12.0)
Neutro Abs: 3.4 10*3/uL (ref 1.4–7.7)
Neutrophils Relative %: 54.6 % (ref 43.0–77.0)
Platelets: 288 10*3/uL (ref 150.0–400.0)
RBC: 3.49 Mil/uL — ABNORMAL LOW (ref 3.87–5.11)
RDW: 14.3 % (ref 11.5–15.5)
WBC: 6.2 10*3/uL (ref 4.0–10.5)

## 2022-07-04 LAB — HEMOGLOBIN A1C: Hgb A1c MFr Bld: 5.5 % (ref 4.6–6.5)

## 2022-07-04 NOTE — Progress Notes (Unsigned)
Subjective:    [name], female    DOB: 31-Oct-1933, 89 y.o.   MRN: [id number]  Patient here for  Chief Complaint  Patient presents with   Annual Exam    HPI Here for physical exam. On amlodipine. Saw Dr Meredeth Ide 05/19/22 - persistent cough.  Treated with zpak, medrol dose pak and albuterol prn.  Had CXR.  Discussed results.  No pneumonia present.  Did report - minimal bibasilar heterogeneous opacities favored to represent atelectasis.  Also peripherally calcified breast prosthesis seen bilaterally with suspected rupture of the left sided prosthesis.  Discussed further w/up for the breast finding.  She declines.  Also breathing is stable and desires no further pulmonary w/up, evaluation or scanning.  No chest pain or sob reported.  Very active.  Working.  No acid reflux.  No abdominal pain or bowel change.     Past Medical History:  Diagnosis Date   Cancer (HCC)    skin cancer nose   Complication of anesthesia    woke up during partial hysterectomy   Elevated blood pressure    GERD (gastroesophageal reflux disease)    occ-no meds   Headache    h/o    Heart murmur    asymptomatic   Past Surgical History:  Procedure Laterality Date   ABDOMINAL HYSTERECTOMY  1974   partial   BREAST SURGERY     IMPLANTS   CATARACT EXTRACTION W/PHACO Right 02/16/2018   Procedure: CATARACT EXTRACTION PHACO AND INTRAOCULAR LENS PLACEMENT (IOC) RIGHT;  Surgeon: Elliot Cousin, MD;  Location: [facility];  Service: Ophthalmology;  Laterality: Right;  [place]  01:10 CDE 11.02 Fluid pack lot # [id number]   CATARACT EXTRACTION W/PHACO Left 03/18/2018   Procedure: CATARACT EXTRACTION PHACO AND INTRAOCULAR LENS PLACEMENT (IOC) LEFT;  Surgeon: Elliot Cousin, MD;  Location: [facility];  Service: Ophthalmology;  Laterality: Left;  [place] 01:06.5 CDE 10.80 FLUID PACK LOT # [id number]   LUMBAR LAMINECTOMY/ DECOMPRESSION WITH MET-RX Bilateral 09/20/2018   Procedure: LUMBAR LAMINECTOMY/ DECOMPRESSION WITH MET-RX;   Surgeon: Lucy Chris, MD;  Location: [facility];  Service: Neurosurgery;  Laterality: Bilateral;   TONSILLECTOMY AND ADENOIDECTOMY  1944   Family History  Problem Relation Age of Onset   Stroke Mother    Hypertension Mother    Stroke Father    Hypertension Father    Diabetes Other    Social History   Socioeconomic History   Marital status: Married    Spouse name: Not on file   Number of children: Not on file   Years of education: Not on file   Highest education level: Not on file  Occupational History   Not on file  Tobacco Use   Smoking status: Never   Smokeless tobacco: Never  Vaping Use   Vaping Use: Never used  Substance and Sexual Activity   Alcohol use: No    Alcohol/week: 0.0 standard drinks of alcohol   Drug use: No   Sexual activity: Not on file  Other Topics Concern   Not on file  Social History Narrative   Not on file   Social Determinants of Health   Financial Resource Strain: Low Risk  (01/23/2022)   Overall Financial Resource Strain (CARDIA)    Difficulty of Paying Living Expenses: Not hard at all  Food Insecurity: No Food Insecurity (01/23/2022)   Hunger Vital Sign    Worried About Running Out of Food in the Last Year: Never true    Ran Out of Food in  the Last Year: Never true  Transportation Needs: No Transportation Needs (01/23/2022)   PRAPARE - Administrator, Civil Service (Medical): No    Lack of Transportation (Non-Medical): No  Physical Activity: Insufficiently Active (01/23/2022)   Exercise Vital Sign    Days of Exercise per Week: 4 days    Minutes of Exercise per Session: 20 min  Stress: No Stress Concern Present (01/23/2022)   [employer] of Occupational Health - Occupational Stress Questionnaire    Feeling of Stress : Not at all  Social Connections: Unknown (01/23/2022)   Social Connection and Isolation Panel [NHANES]    Frequency of Communication with Friends and Family: Not on file    Frequency of Social Gatherings with  Friends and Family: Not on file    Attends Religious Services: Not on file    Active Member of Clubs or Organizations: Not on file    Attends Banker Meetings: Not on file    Marital Status: Married     Review of Systems  Constitutional:  Negative for appetite change and unexpected weight change.  HENT:  Negative for congestion, sinus pressure and sore throat.   Eyes:  Negative for pain and visual disturbance.  Respiratory:  Negative for cough, chest tightness and shortness of breath.   Cardiovascular:  Negative for chest pain, palpitations and leg swelling.  Gastrointestinal:  Negative for abdominal pain, diarrhea, nausea and vomiting.  Genitourinary:  Negative for difficulty urinating and dysuria.  Musculoskeletal:  Negative for joint swelling and myalgias.  Skin:  Negative for color change and rash.  Neurological:  Negative for dizziness and headaches.  Hematological:  Negative for adenopathy. Does not bruise/bleed easily.  Psychiatric/Behavioral:  Negative for agitation and dysphoric mood.        Objective:     BP 128/70   Pulse 74   Temp 98 F (36.7 C)   Resp 16   Ht 5\' 1"  (1.549 m)   Wt 125 lb (56.7 kg)   SpO2 98%   BMI 23.62 kg/m  Wt Readings from Last 3 Encounters:  07/04/22 125 lb (56.7 kg)  02/12/22 130 lb 6.4 oz (59.1 kg)  01/23/22 127 lb (57.6 kg)    Physical Exam Vitals reviewed.  Constitutional:      General: She is not in acute distress.    Appearance: Normal appearance.  HENT:     Head: Normocephalic and atraumatic.     Right Ear: External ear normal.     Left Ear: External ear normal.  Eyes:     General: No scleral icterus.       Right eye: No discharge.        Left eye: No discharge.     Conjunctiva/sclera: Conjunctivae normal.  Neck:     Thyroid: No thyromegaly.  Cardiovascular:     Rate and Rhythm: Normal rate and regular rhythm.  Pulmonary:     Effort: No respiratory distress.     Breath sounds: Normal breath sounds. No  wheezing.     Comments: Breasts:  no nipple discharge or nipple retraction present. Bilateral implants.  No axillary adenopathy present.  Abdominal:     General: Bowel sounds are normal.     Palpations: Abdomen is soft.     Tenderness: There is no abdominal tenderness.  Musculoskeletal:        General: No swelling or tenderness.     Cervical back: Neck supple. No tenderness.  Lymphadenopathy:     Cervical: No cervical adenopathy.  Skin:    Findings: No erythema or rash.  Neurological:     Mental Status: She is alert.  Psychiatric:        Mood and Affect: Mood normal.        Behavior: Behavior normal.      Outpatient Encounter Medications as of 07/04/2022  Medication Sig   amLODipine (NORVASC) 5 MG tablet TAKE ONE TABLET (5 MG) BY MOUTH EVERY DAY   Coenzyme Q10 (COQ10) 100 MG CAPS Take 100 mg by mouth daily.    dorzolamidel-timolol (COSOPT) 22.3-6.8 MG/ML SOLN ophthalmic solution Place 1 drop into both eyes 2 (two) times daily.   ELIQUIS 2.5 MG TABS tablet TAKE ONE TABLET TWICE DAILY   latanoprost (XALATAN) 0.005 % ophthalmic solution Place 1 drop into both eyes at bedtime.   Multiple Vitamins-Minerals (MULTIVITAMIN WITH MINERALS) tablet Take 1 tablet by mouth daily.    ROCKLATAN 0.02-0.005 % SOLN SMARTSIG:1 Drop(s) In Eye(s) Every Evening   vitamin C (ASCORBIC ACID) 500 MG tablet Take 500 mg by mouth daily.    No facility-administered encounter medications on file as of 07/04/2022.     Lab Results  Component Value Date   WBC 6.2 07/04/2022   HGB 12.5 07/04/2022   HCT 35.4 (L) 07/04/2022   PLT 288.0 07/04/2022   GLUCOSE 105 (H) 07/04/2022   CHOL 273 (H) 07/04/2022   TRIG 79.0 07/04/2022   HDL 62.30 07/04/2022   LDLDIRECT 192.0 02/13/2020   LDLCALC 195 (H) 07/04/2022   ALT 15 07/04/2022   AST 18 07/04/2022   NA 141 07/04/2022   K 4.5 07/04/2022   CL 107 07/04/2022   CREATININE 0.77 07/04/2022   BUN 20 07/04/2022   CO2 25 07/04/2022   TSH 1.88 02/12/2022   INR 1.1  01/17/2020   HGBA1C 5.5 07/04/2022    DG Chest Port 1 View  Result Date: 01/19/2020 CLINICAL DATA:  Dyspnea EXAM: PORTABLE CHEST 1 VIEW COMPARISON:  01/17/2020 FINDINGS: Lung volumes are small, but are symmetric and pulmonary insufflation remain stable. Superimposed multifocal pulmonary infiltrate is again identified and has progressed, particularly within the right upper lobe. No pneumothorax or pleural effusion. Cardiac size within normal limits. Bilateral breast implants noted. No acute bone abnormality. IMPRESSION: Progressive multifocal pulmonary infiltrates, more focal within the right upper lobe, in keeping with changes of atypical infection in the acute setting. Electronically Signed   By: Helyn Numbers MD   On: 01/19/2020 05:58   ECHOCARDIOGRAM COMPLETE  Result Date: 01/18/2020    ECHOCARDIOGRAM REPORT   Patient Name:   TURKESSA HENNEN Date of Exam: 01/18/2020 Medical Rec #:  [id number]    Height:       61.0 in Accession #:    [id number]   Weight:       120.0 lb Date of Birth:  [birth year]    BSA:          1.520 m Patient Age:    86 years     BP:           126/60 mmHg Patient Gender: F            HR:           80 bpm. Exam Location:  [facility] Procedure: 2D Echo, Color Doppler, Cardiac Doppler and Strain Analysis Indications:     I48.91 Atrial fibrillation  History:         Patient has no prior history of Echocardiogram examinations.  Sonographer:     Humphrey Rolls RDCS (AE) Referring  Phys:  [id number] Vernetta Honey MANSY Diagnosing Phys: Harold Hedge MD  Sonographer Comments: Suboptimal parasternal window. Image acquisition challenging due to breast implants. Global longitudinal strain was attempted. IMPRESSIONS  1. Left ventricular ejection fraction, by estimation, is 55 to 60%. The left ventricle has normal function. The left ventricle has no regional wall motion abnormalities. There is moderate left ventricular hypertrophy. Left ventricular diastolic parameters are consistent with Grade I diastolic dysfunction  (impaired relaxation).  2. Right ventricular systolic function is normal. The right ventricular size is normal.  3. The mitral valve is grossly normal. Trivial mitral valve regurgitation.  4. The aortic valve is grossly normal. Aortic valve regurgitation is trivial. FINDINGS  Left Ventricle: Left ventricular ejection fraction, by estimation, is 55 to 60%. The left ventricle has normal function. The left ventricle has no regional wall motion abnormalities. The left ventricular internal cavity size was normal in size. There is  moderate left ventricular hypertrophy. Left ventricular diastolic parameters are consistent with Grade I diastolic dysfunction (impaired relaxation). Right Ventricle: The right ventricular size is normal. No increase in right ventricular wall thickness. Right ventricular systolic function is normal. Left Atrium: Left atrial size was normal in size. Right Atrium: Right atrial size was normal in size. Pericardium: There is no evidence of pericardial effusion. Mitral Valve: The mitral valve is grossly normal. Trivial mitral valve regurgitation. MV peak gradient, 6.2 mmHg. The mean mitral valve gradient is 3.0 mmHg. Tricuspid Valve: The tricuspid valve is grossly normal. Tricuspid valve regurgitation is trivial. Aortic Valve: The aortic valve is grossly normal. Aortic valve regurgitation is trivial. Aortic valve mean gradient measures 9.0 mmHg. Aortic valve peak gradient measures 14.7 mmHg. Aortic valve area, by VTI measures 1.18 cm. Pulmonic Valve: The pulmonic valve was not assessed. Pulmonic valve regurgitation is not visualized. Aorta: The aortic root is normal in size and structure. IAS/Shunts: The interatrial septum was not assessed.  LEFT VENTRICLE PLAX 2D LVIDd:         4.07 cm  Diastology LVIDs:         2.85 cm  LV e' medial:    5.33 cm/s LV PW:         1.05 cm  LV E/e' medial:  17.3 LV IVS:        0.88 cm  LV e' lateral:   6.53 cm/s LVOT diam:     1.90 cm  LV E/e' lateral: 14.1 LV SV:          46 LV SV Index:   31 LVOT Area:     2.84 cm  RIGHT VENTRICLE RV Basal diam:  2.52 cm LEFT ATRIUM             Index       RIGHT ATRIUM           Index LA Vol (A2C):   24.7 ml 16.25 ml/m RA Area:     10.90 cm LA Vol (A4C):   25.8 ml 16.97 ml/m RA Volume:   26.80 ml  17.63 ml/m LA Biplane Vol: 27.1 ml 17.83 ml/m  AORTIC VALVE                    PULMONIC VALVE AV Area (Vmax):    1.31 cm     PV Vmax:       0.85 m/s AV Area (Vmean):   1.32 cm     PV Vmean:      60.100 cm/s AV Area (VTI):     1.18 cm  PV VTI:        0.150 m AV Vmax:           192.00 cm/s  PV Peak grad:  2.9 mmHg AV Vmean:          137.000 cm/s PV Mean grad:  2.0 mmHg AV VTI:            0.395 m AV Peak Grad:      14.7 mmHg AV Mean Grad:      9.0 mmHg LVOT Vmax:         88.80 cm/s LVOT Vmean:        63.800 cm/s LVOT VTI:          0.164 m LVOT/AV VTI ratio: 0.42  AORTA Ao Root diam: 2.60 cm MITRAL VALVE MV Area (PHT): 4.19 cm     SHUNTS MV Peak grad:  6.2 mmHg     Systemic VTI:  0.16 m MV Mean grad:  3.0 mmHg     Systemic Diam: 1.90 cm MV Vmax:       1.25 m/s MV Vmean:      77.4 cm/s MV Decel Time: 181 msec MV E velocity: 92.10 cm/s MV A velocity: 131.00 cm/s MV E/A ratio:  0.70 Harold Hedge MD Electronically signed by Harold Hedge MD Signature Date/Time: 01/18/2020/4:55:43 PM    Final        Assessment & Plan:  Health care maintenance Assessment & Plan: Physical today 07/04/22.  Declines mammogram and colonoscopy.    Essential hypertension Assessment & Plan: On amlodipine 5mg  q day.  Not taking regularly.  Discussed the need for her to take her medication regularly.  Blood pressure checks outside better. Follow pressures.  Follow metabolic panel.  Send in readings. Desires no change in medication.   Orders: -     Basic metabolic panel  Hypercholesterolemia Assessment & Plan: Have discussed recommendation to start cholesterol medication.  She declines.  Low cholesterol diet and exercise.  Follow lipid panel.   Orders: -      CBC with Differential/Platelet -     Hepatic function panel -     Lipid panel  Hyperglycemia Assessment & Plan: Low carb diet and exercise.  Follow met b and a1c.   Orders: -     Hemoglobin A1c  Atrial fibrillation, unspecified type [facility]) Assessment & Plan: Denies any increased heart rate or palpitations.  Continues on eliquis.  No chest pain or sob.     Thyroid mass Assessment & Plan: Thyroid mass noted on previous scan. Have discussed further w/up and follow up. Discussed again today. She declines.  Follow.    Abnormal CXR Assessment & Plan: Received copy of cxr from Dr Meredeth Ide.  Had CXR.  Discussed results.  No pneumonia present.  Did report - minimal bibasilar heterogeneous opacities favored to represent atelectasis.  Also peripherally calcified breast prosthesis seen bilaterally with suspected rupture of the left sided prosthesis.  Discussed further w/up for the breast finding.  She declines.  Also breathing is stable and desires no further pulmonary w/up      Dale La[place], MD

## 2022-07-04 NOTE — Assessment & Plan Note (Signed)
Physical today 07/04/22.  Declines mammogram and colonoscopy.

## 2022-07-06 ENCOUNTER — Encounter: Payer: Self-pay | Admitting: Internal Medicine

## 2022-07-06 DIAGNOSIS — R9389 Abnormal findings on diagnostic imaging of other specified body structures: Secondary | ICD-10-CM | POA: Insufficient documentation

## 2022-07-06 NOTE — Assessment & Plan Note (Signed)
Received copy of cxr from Dr Meredeth Ide.  Had CXR.  Discussed results.  No pneumonia present.  Did report - minimal bibasilar heterogeneous opacities favored to represent atelectasis.  Also peripherally calcified breast prosthesis seen bilaterally with suspected rupture of the left sided prosthesis.  Discussed further w/up for the breast finding.  She declines.  Also breathing is stable and desires no further pulmonary w/up

## 2022-07-06 NOTE — Assessment & Plan Note (Signed)
Low carb diet and exercise.  Follow met b and a1c.   

## 2022-07-06 NOTE — Assessment & Plan Note (Signed)
On amlodipine 5mg  q day.  Not taking regularly.  Discussed the need for her to take her medication regularly.  Blood pressure checks outside better. Follow pressures.  Follow metabolic panel.  Send in readings. Desires no change in medication.

## 2022-07-06 NOTE — Assessment & Plan Note (Signed)
Thyroid mass noted on previous scan. Have discussed further w/up and follow up. Discussed again today. She declines.  Follow.

## 2022-07-06 NOTE — Assessment & Plan Note (Signed)
Denies any increased heart rate or palpitations.  Continues on eliquis.  No chest pain or sob.   

## 2022-07-06 NOTE — Assessment & Plan Note (Signed)
Have discussed recommendation to start cholesterol medication.  She declines.  Low cholesterol diet and exercise.  Follow lipid panel.  

## 2022-07-07 ENCOUNTER — Telehealth: Payer: Self-pay | Admitting: Internal Medicine

## 2022-07-07 NOTE — Telephone Encounter (Signed)
See results note. 

## 2022-07-07 NOTE — Telephone Encounter (Signed)
Pt called in asking about her lab results. Note from Dr.Scott was read to her, pt its still a little confused, and still thinking starting Crestor. Pt would like to f/u with Trisha LPN.

## 2022-07-07 NOTE — Telephone Encounter (Signed)
-----   Message from Dale Bentonia, MD sent at 07/06/2022 12:16 PM EDT ----- Notify - kidney function is relatively stable.  Hgb is wnl.  Would like to check B12 level.  (Can use dx of history of anemia).  Cholesterol elevated.  Has improved some, but elevated.  Would like to start crestor 10mg  q day.  Will need liver panel checked in 6 weeks after starting.  Overall sugar control wnl. Liver function tests wnl.

## 2022-10-07 DIAGNOSIS — H401133 Primary open-angle glaucoma, bilateral, severe stage: Secondary | ICD-10-CM | POA: Diagnosis not present

## 2022-10-07 DIAGNOSIS — Z961 Presence of intraocular lens: Secondary | ICD-10-CM | POA: Diagnosis not present

## 2022-10-07 DIAGNOSIS — H3581 Retinal edema: Secondary | ICD-10-CM | POA: Diagnosis not present

## 2022-10-07 DIAGNOSIS — H35372 Puckering of macula, left eye: Secondary | ICD-10-CM | POA: Diagnosis not present

## 2023-01-05 ENCOUNTER — Other Ambulatory Visit: Payer: Medicare HMO

## 2023-01-05 ENCOUNTER — Ambulatory Visit: Payer: Medicare HMO | Admitting: Internal Medicine

## 2023-01-05 ENCOUNTER — Encounter: Payer: Self-pay | Admitting: Internal Medicine

## 2023-01-05 VITALS — BP 118/70 | HR 86 | Temp 98.2°F | Resp 16 | Ht 61.0 in | Wt 118.2 lb

## 2023-01-05 DIAGNOSIS — R739 Hyperglycemia, unspecified: Secondary | ICD-10-CM

## 2023-01-05 DIAGNOSIS — I4891 Unspecified atrial fibrillation: Secondary | ICD-10-CM | POA: Diagnosis not present

## 2023-01-05 DIAGNOSIS — R0989 Other specified symptoms and signs involving the circulatory and respiratory systems: Secondary | ICD-10-CM | POA: Diagnosis not present

## 2023-01-05 DIAGNOSIS — E079 Disorder of thyroid, unspecified: Secondary | ICD-10-CM

## 2023-01-05 DIAGNOSIS — R011 Cardiac murmur, unspecified: Secondary | ICD-10-CM

## 2023-01-05 DIAGNOSIS — R768 Other specified abnormal immunological findings in serum: Secondary | ICD-10-CM

## 2023-01-05 DIAGNOSIS — I10 Essential (primary) hypertension: Secondary | ICD-10-CM | POA: Diagnosis not present

## 2023-01-05 DIAGNOSIS — Z862 Personal history of diseases of the blood and blood-forming organs and certain disorders involving the immune mechanism: Secondary | ICD-10-CM | POA: Diagnosis not present

## 2023-01-05 DIAGNOSIS — E7800 Pure hypercholesterolemia, unspecified: Secondary | ICD-10-CM | POA: Diagnosis not present

## 2023-01-05 LAB — LIPID PANEL
Cholesterol: 328 mg/dL — ABNORMAL HIGH (ref 0–200)
HDL: 74.3 mg/dL (ref >39.00)
LDL Cholesterol: 232 mg/dL — ABNORMAL HIGH (ref 0–99)
NonHDL: 253.27
Total CHOL/HDL Ratio: 4
Triglycerides: 105 mg/dL (ref 0.0–149.0)
VLDL: 21 mg/dL (ref 0.0–40.0)

## 2023-01-05 LAB — HEPATIC FUNCTION PANEL
ALT: 13 U/L (ref 0–35)
AST: 16 U/L (ref 0–37)
Albumin: 4.6 g/dL (ref 3.5–5.2)
Alkaline Phosphatase: 54 U/L (ref 39–117)
Bilirubin, Direct: 0.1 mg/dL (ref 0.0–0.3)
Total Bilirubin: 0.7 mg/dL (ref 0.2–1.2)
Total Protein: 6.5 g/dL (ref 6.0–8.3)

## 2023-01-05 LAB — BASIC METABOLIC PANEL
BUN: 22 mg/dL (ref 6–23)
CO2: 27 meq/L (ref 19–32)
Calcium: 9.2 mg/dL (ref 8.4–10.5)
Chloride: 106 meq/L (ref 96–112)
Creatinine, Ser: 0.85 mg/dL (ref 0.40–1.20)
GFR: 61.62 mL/min (ref >60.00)
Glucose, Bld: 96 mg/dL (ref 70–99)
Potassium: 4.8 meq/L (ref 3.5–5.1)
Sodium: 141 meq/L (ref 135–145)

## 2023-01-05 LAB — CBC WITH DIFFERENTIAL/PLATELET
Absolute Lymphocytes: 1426 {cells}/uL (ref 850–3900)
Absolute Monocytes: 396 {cells}/uL (ref 200–950)
Basophils Absolute: 22 {cells}/uL (ref 0–200)
Basophils Relative: 0.5 %
Eosinophils Absolute: 207 {cells}/uL (ref 15–500)
Eosinophils Relative: 4.7 %
HCT: 36.8 % (ref 35.0–45.0)
Hemoglobin: 12.6 g/dL (ref 11.7–15.5)
MCH: 33 pg (ref 27.0–33.0)
MCHC: 34.2 g/dL (ref 32.0–36.0)
MCV: 96.3 fL (ref 80.0–100.0)
MPV: 11.3 fL (ref 7.5–12.5)
Monocytes Relative: 9 %
Neutro Abs: 2350 {cells}/uL (ref 1500–7800)
Neutrophils Relative %: 53.4 %
Platelets: 329 10*3/uL (ref 140–400)
RBC: 3.82 10*6/uL (ref 3.80–5.10)
RDW: 13.3 % (ref 11.0–15.0)
Total Lymphocyte: 32.4 %
WBC: 4.4 10*3/uL (ref 3.8–10.8)

## 2023-01-05 LAB — TSH: TSH: 1.62 u[IU]/mL (ref 0.35–5.50)

## 2023-01-05 LAB — HEMOGLOBIN A1C: Hgb A1c MFr Bld: 5.6 % (ref 4.6–6.5)

## 2023-01-05 LAB — VITAMIN B12: Vitamin B-12: 240 pg/mL (ref 211–911)

## 2023-01-05 MED ORDER — AMLODIPINE BESYLATE 5 MG PO TABS: 5.0000 mg | ORAL_TABLET | Freq: Every day | ORAL | 3 refills | Status: DC

## 2023-01-05 MED ORDER — APIXABAN 2.5 MG PO TABS: 2.5000 mg | ORAL_TABLET | Freq: Two times a day (BID) | ORAL | 3 refills | Status: DC

## 2023-01-05 NOTE — Progress Notes (Signed)
Subjective:    [name], female    DOB: Jul 17, 1933, 89 y.o.   MRN: [id number]  Patient here for  Chief Complaint  Patient presents with   Medical Management of Chronic Issues    HPI Here for a scheduled follow up - f/u regarding hypertension, hypercholesterolemia and afib. Saw Dr Meredeth Ide 05/19/22.  Had CXR.  Discussed results.  No pneumonia present.  Did report - minimal bibasilar heterogeneous opacities favored to represent atelectasis.  Discussed f/u cxr/scan. She declines. States she is doing well. Denies sob. Has f/u planned with Dr Meredeth Ide in 01/2023. Also peripherally calcified breast prosthesis seen bilaterally with suspected rupture of the left sided prosthesis.  Discussed further w/up for the breast finding.  She declines. Previous CT in 2015 revealed a mass arising from the left lobe of thyroid measuring 1.4 x 1.0 cm. Have discussed with her regarding f/u testing, ultrasound. She has declined. Discussed again today - declines.  No cough or congestion. No chest pain. No abdominal pain or bowel change.   Past Medical History:  Diagnosis Date   Cancer (HCC)    skin cancer nose   Complication of anesthesia    woke up during partial hysterectomy   Elevated blood pressure    GERD (gastroesophageal reflux disease)    occ-no meds   Headache    h/o    Heart murmur    asymptomatic   Past Surgical History:  Procedure Laterality Date   ABDOMINAL HYSTERECTOMY  1974   partial   BREAST SURGERY     IMPLANTS   CATARACT EXTRACTION W/PHACO Right 02/16/2018   Procedure: CATARACT EXTRACTION PHACO AND INTRAOCULAR LENS PLACEMENT (IOC) RIGHT;  Surgeon: Elliot Cousin, MD;  Location: [facility] ORS;  Service: Ophthalmology;  Laterality: Right;  [place]  01:10 CDE 11.02 Fluid pack lot # [id number]   CATARACT EXTRACTION W/PHACO Left 03/18/2018   Procedure: CATARACT EXTRACTION PHACO AND INTRAOCULAR LENS PLACEMENT (IOC) LEFT;  Surgeon: Elliot Cousin, MD;  Location: [facility] OR[facility];  Service:  Ophthalmology;  Laterality: Left;  [place] 01:06.5 CDE 10.80 FLUID PACK LOT # [id number]   LUMBAR LAMINECTOMY/ DECOMPRESSION WITH MET-RX Bilateral 09/20/2018   Procedure: LUMBAR LAMINECTOMY/ DECOMPRESSION WITH MET-RX;  Surgeon: Lucy Chris, MD;  Location: [facility] OR[facility];  Service: Neurosurgery;  Laterality: Bilateral;   TONSILLECTOMY AND ADENOIDECTOMY  1944   Family History  Problem Relation Age of Onset   Stroke Mother    Hypertension Mother    Stroke Father    Hypertension Father    Diabetes Other    Social History   Socioeconomic History   Marital status: Married    Spouse name: Not on file   Number of children: Not on file   Years of education: Not on file   Highest education level: Not on file  Occupational History   Not on file  Tobacco Use   Smoking status: Never   Smokeless tobacco: Never  Vaping Use   Vaping status: Never Used  Substance and Sexual Activity   Alcohol use: No    Alcohol/week: 0.0 standard drinks of alcohol   Drug use: No   Sexual activity: Not on file  Other Topics Concern   Not on file  Social History Narrative   Not on file   Social Drivers of Health   Financial Resource Strain: Low Risk  (01/23/2022)   Overall Financial Resource Strain (CARDIA)    Difficulty of Paying Living Expenses: Not hard at all  Food Insecurity: No Food Insecurity (01/23/2022)  Hunger Vital Sign    Worried About Running Out of Food in the Last Year: Never true    Ran Out of Food in the Last Year: Never true  Transportation Needs: No Transportation Needs (01/23/2022)   PRAPARE - Administrator, Civil Service (Medical): No    Lack of Transportation (Non-Medical): No  Physical Activity: Insufficiently Active (01/23/2022)   Exercise Vital Sign    Days of Exercise per Week: 4 days    Minutes of Exercise per Session: 20 min  Stress: No Stress Concern Present (01/23/2022)   [employer] of Occupational Health - Occupational Stress Questionnaire    Feeling of Stress : Not  at all  Social Connections: Unknown (01/23/2022)   Social Connection and Isolation Panel [NHANES]    Frequency of Communication with Friends and Family: Not on file    Frequency of Social Gatherings with Friends and Family: Not on file    Attends Religious Services: Not on file    Active Member of Clubs or Organizations: Not on file    Attends Banker Meetings: Not on file    Marital Status: Married     Review of Systems  Constitutional:  Negative for appetite change and unexpected weight change.  HENT:  Negative for congestion and sinus pressure.   Respiratory:  Negative for cough, chest tightness and shortness of breath.   Cardiovascular:  Negative for chest pain, palpitations and leg swelling.  Gastrointestinal:  Negative for abdominal pain, diarrhea, nausea and vomiting.  Genitourinary:  Negative for difficulty urinating and dysuria.  Musculoskeletal:  Negative for joint swelling and myalgias.  Skin:  Negative for color change and rash.  Neurological:  Negative for dizziness and headaches.  Psychiatric/Behavioral:  Negative for agitation and dysphoric mood.        Objective:     BP 118/70   Pulse 86   Temp 98.2 F (36.8 C)   Resp 16   Ht 5\' 1"  (1.549 m)   Wt 118 lb 3.2 oz (53.6 kg)   SpO2 98%   BMI 22.33 kg/m  Wt Readings from Last 3 Encounters:  01/05/23 118 lb 3.2 oz (53.6 kg)  07/04/22 125 lb (56.7 kg)  02/12/22 130 lb 6.4 oz (59.1 kg)    Physical Exam Vitals reviewed.  Constitutional:      General: She is not in acute distress.    Appearance: Normal appearance.  HENT:     Head: Normocephalic and atraumatic.     Right Ear: External ear normal.     Left Ear: External ear normal.     Mouth/Throat:     Pharynx: No oropharyngeal exudate or posterior oropharyngeal erythema.  Eyes:     General: No scleral icterus.       Right eye: No discharge.        Left eye: No discharge.     Conjunctiva/sclera: Conjunctivae normal.  Neck:     Thyroid: No  thyromegaly.  Cardiovascular:     Rate and Rhythm: Normal rate and regular rhythm.     Comments: 1-2/6 systolic murmur.  Right carotid bruit.  Pulmonary:     Effort: No respiratory distress.     Breath sounds: Normal breath sounds. No wheezing.  Abdominal:     General: Bowel sounds are normal.     Palpations: Abdomen is soft.     Tenderness: There is no abdominal tenderness.  Musculoskeletal:        General: No swelling or tenderness.  Cervical back: Neck supple. No tenderness.  Lymphadenopathy:     Cervical: No cervical adenopathy.  Skin:    Findings: No erythema or rash.  Neurological:     Mental Status: She is alert.  Psychiatric:        Mood and Affect: Mood normal.        Behavior: Behavior normal.      Outpatient Encounter Medications as of 01/05/2023  Medication Sig   Coenzyme Q10 (COQ10) 100 MG CAPS Take 100 mg by mouth daily.    dorzolamidel-timolol (COSOPT) 22.3-6.8 MG/ML SOLN ophthalmic solution Place 1 drop into both eyes 2 (two) times daily.   latanoprost (XALATAN) 0.005 % ophthalmic solution Place 1 drop into both eyes at bedtime.   Multiple Vitamins-Minerals (MULTIVITAMIN WITH MINERALS) tablet Take 1 tablet by mouth daily.    ROCKLATAN 0.02-0.005 % SOLN SMARTSIG:1 Drop(s) In Eye(s) Every Evening   vitamin C (ASCORBIC ACID) 500 MG tablet Take 500 mg by mouth daily.    [DISCONTINUED] amLODipine (NORVASC) 5 MG tablet TAKE ONE TABLET (5 MG) BY MOUTH EVERY DAY   [DISCONTINUED] ELIQUIS 2.5 MG TABS tablet TAKE ONE TABLET TWICE DAILY   amLODipine (NORVASC) 5 MG tablet Take 1 tablet (5 mg total) by mouth daily.   apixaban (ELIQUIS) 2.5 MG TABS tablet Take 1 tablet (2.5 mg total) by mouth 2 (two) times daily.   No facility-administered encounter medications on file as of 01/05/2023.     Lab Results  Component Value Date   WBC 6.2 07/04/2022   HGB 12.5 07/04/2022   HCT 35.4 (L) 07/04/2022   PLT 288.0 07/04/2022   GLUCOSE 105 (H) 07/04/2022   CHOL 273 (H)  07/04/2022   TRIG 79.0 07/04/2022   HDL 62.30 07/04/2022   LDLDIRECT 192.0 02/13/2020   LDLCALC 195 (H) 07/04/2022   ALT 15 07/04/2022   AST 18 07/04/2022   NA 141 07/04/2022   K 4.5 07/04/2022   CL 107 07/04/2022   CREATININE 0.77 07/04/2022   BUN 20 07/04/2022   CO2 25 07/04/2022   TSH 1.88 02/12/2022   INR 1.1 01/17/2020   HGBA1C 5.5 07/04/2022    DG Chest Port 1 View Result Date: 01/19/2020 CLINICAL DATA:  Dyspnea EXAM: PORTABLE CHEST 1 VIEW COMPARISON:  01/17/2020 FINDINGS: Lung volumes are small, but are symmetric and pulmonary insufflation remain stable. Superimposed multifocal pulmonary infiltrate is again identified and has progressed, particularly within the right upper lobe. No pneumothorax or pleural effusion. Cardiac size within normal limits. Bilateral breast implants noted. No acute bone abnormality. IMPRESSION: Progressive multifocal pulmonary infiltrates, more focal within the right upper lobe, in keeping with changes of atypical infection in the acute setting. Electronically Signed   By: Helyn Numbers MD   On: 01/19/2020 05:58   ECHOCARDIOGRAM COMPLETE Result Date: 01/18/2020    ECHOCARDIOGRAM REPORT   Patient Name:   SHANINE BAZEN Date of Exam: 01/18/2020 Medical Rec #:  [id number]    Height:       61.0 in Accession #:    [id number]   Weight:       120.0 lb Date of Birth:  [birth year]    BSA:          1.520 m Patient Age:    86 years     BP:           126/60 mmHg Patient Gender: F            HR:  80 bpm. Exam Location:  [facility] Procedure: 2D Echo, Color Doppler, Cardiac Doppler and Strain Analysis Indications:     I48.91 Atrial fibrillation  History:         Patient has no prior history of Echocardiogram examinations.  Sonographer:     Humphrey Rolls RDCS (AE) Referring Phys:  [id number] Vernetta Honey MANSY Diagnosing Phys: Harold Hedge MD  Sonographer Comments: Suboptimal parasternal window. Image acquisition challenging due to breast implants. Global longitudinal strain was  attempted. IMPRESSIONS  1. Left ventricular ejection fraction, by estimation, is 55 to 60%. The left ventricle has normal function. The left ventricle has no regional wall motion abnormalities. There is moderate left ventricular hypertrophy. Left ventricular diastolic parameters are consistent with Grade I diastolic dysfunction (impaired relaxation).  2. Right ventricular systolic function is normal. The right ventricular size is normal.  3. The mitral valve is grossly normal. Trivial mitral valve regurgitation.  4. The aortic valve is grossly normal. Aortic valve regurgitation is trivial. FINDINGS  Left Ventricle: Left ventricular ejection fraction, by estimation, is 55 to 60%. The left ventricle has normal function. The left ventricle has no regional wall motion abnormalities. The left ventricular internal cavity size was normal in size. There is  moderate left ventricular hypertrophy. Left ventricular diastolic parameters are consistent with Grade I diastolic dysfunction (impaired relaxation). Right Ventricle: The right ventricular size is normal. No increase in right ventricular wall thickness. Right ventricular systolic function is normal. Left Atrium: Left atrial size was normal in size. Right Atrium: Right atrial size was normal in size. Pericardium: There is no evidence of pericardial effusion. Mitral Valve: The mitral valve is grossly normal. Trivial mitral valve regurgitation. MV peak gradient, 6.2 mmHg. The mean mitral valve gradient is 3.0 mmHg. Tricuspid Valve: The tricuspid valve is grossly normal. Tricuspid valve regurgitation is trivial. Aortic Valve: The aortic valve is grossly normal. Aortic valve regurgitation is trivial. Aortic valve mean gradient measures 9.0 mmHg. Aortic valve peak gradient measures 14.7 mmHg. Aortic valve area, by VTI measures 1.18 cm. Pulmonic Valve: The pulmonic valve was not assessed. Pulmonic valve regurgitation is not visualized. Aorta: The aortic root is normal in size  and structure. IAS/Shunts: The interatrial septum was not assessed.  LEFT VENTRICLE PLAX 2D LVIDd:         4.07 cm  Diastology LVIDs:         2.85 cm  LV e' medial:    5.33 cm/s LV PW:         1.05 cm  LV E/e' medial:  17.3 LV IVS:        0.88 cm  LV e' lateral:   6.53 cm/s LVOT diam:     1.90 cm  LV E/e' lateral: 14.1 LV SV:         46 LV SV Index:   31 LVOT Area:     2.84 cm  RIGHT VENTRICLE RV Basal diam:  2.52 cm LEFT ATRIUM             Index       RIGHT ATRIUM           Index LA Vol (A2C):   24.7 ml 16.25 ml/m RA Area:     10.90 cm LA Vol (A4C):   25.8 ml 16.97 ml/m RA Volume:   26.80 ml  17.63 ml/m LA Biplane Vol: 27.1 ml 17.83 ml/m  AORTIC VALVE  PULMONIC VALVE AV Area (Vmax):    1.31 cm     PV Vmax:       0.85 m/s AV Area (Vmean):   1.32 cm     PV Vmean:      60.100 cm/s AV Area (VTI):     1.18 cm     PV VTI:        0.150 m AV Vmax:           192.00 cm/s  PV Peak grad:  2.9 mmHg AV Vmean:          137.000 cm/s PV Mean grad:  2.0 mmHg AV VTI:            0.395 m AV Peak Grad:      14.7 mmHg AV Mean Grad:      9.0 mmHg LVOT Vmax:         88.80 cm/s LVOT Vmean:        63.800 cm/s LVOT VTI:          0.164 m LVOT/AV VTI ratio: 0.42  AORTA Ao Root diam: 2.60 cm MITRAL VALVE MV Area (PHT): 4.19 cm     SHUNTS MV Peak grad:  6.2 mmHg     Systemic VTI:  0.16 m MV Mean grad:  3.0 mmHg     Systemic Diam: 1.90 cm MV Vmax:       1.25 m/s MV Vmean:      77.4 cm/s MV Decel Time: 181 msec MV E velocity: 92.10 cm/s MV A velocity: 131.00 cm/s MV E/A ratio:  0.70 Harold Hedge MD Electronically signed by Harold Hedge MD Signature Date/Time: 01/18/2020/4:55:43 PM    Final        Assessment & Plan:  Hypercholesterolemia Assessment & Plan: Have discussed recommendation to start cholesterol medication.  She declines.  Low cholesterol diet and exercise.  Follow lipid panel.   Orders: -     CBC with Differential/Platelet -     Hepatic function panel -     Lipid panel  Hyperglycemia Assessment  & Plan: Low carb diet and exercise.  Follow met b and a1c.   Orders: -     Hemoglobin A1c  Atrial fibrillation, unspecified type [facility]) Assessment & Plan: Denies any increased heart rate or palpitations.  Continues on eliquis.  No chest pain or sob.    Orders: -     TSH  Essential hypertension Assessment & Plan: Supposed to be on amlodipine 5mg  q day.  Blood pressure better today. Follow pressures.  Follow metabolic panel.    Orders: -     Basic metabolic panel  History of anemia Assessment & Plan: Check cbc and b12 today.   Orders: -     CBC with Differential/Platelet -     Vitamin B12  Heart murmur Assessment & Plan: Had echo - no significant valve abnormality.  Currently asymptomatic.  Discussed f/u echo. Wants to hold on further testing.    Right carotid bruit Assessment & Plan: Noticed right carotid bruit.  Has never had carotid ultrasound.  Desires no f/u scanning     Thyroid mass Assessment & Plan: Thyroid mass noted on previous scan. Have discussed further w/up and follow up. Discussed again today. She declines.  Follow.    Other orders -     amLODIPine Besylate; Take 1 tablet (5 mg total) by mouth daily.  Dispense: 90 tablet; Refill: 3 -     Apixaban; Take 1 tablet (2.5 mg total) by mouth 2 (two)  times daily.  Dispense: 180 tablet; Refill: 3     Dale [place], MD

## 2023-01-05 NOTE — Assessment & Plan Note (Signed)
Supposed to be on amlodipine 5mg  q day.  Blood pressure better today. Follow pressures.  Follow metabolic panel.

## 2023-01-05 NOTE — Assessment & Plan Note (Signed)
Had echo - no significant valve abnormality.  Currently asymptomatic.  Discussed f/u echo. Wants to hold on further testing.

## 2023-01-05 NOTE — Assessment & Plan Note (Signed)
Check cbc and b12 today.

## 2023-01-05 NOTE — Assessment & Plan Note (Signed)
Noticed right carotid bruit.  Has never had carotid ultrasound.  Desires no f/u scanning

## 2023-01-05 NOTE — Assessment & Plan Note (Signed)
Have discussed recommendation to start cholesterol medication.  She declines.  Low cholesterol diet and exercise.  Follow lipid panel.  

## 2023-01-05 NOTE — Assessment & Plan Note (Signed)
Low carb diet and exercise.  Follow met b and a1c.   

## 2023-01-05 NOTE — Assessment & Plan Note (Signed)
Thyroid mass noted on previous scan. Have discussed further w/up and follow up. Discussed again today. She declines.  Follow.

## 2023-01-05 NOTE — Assessment & Plan Note (Signed)
Denies any increased heart rate or palpitations.  Continues on eliquis.  No chest pain or sob.   

## 2023-01-06 ENCOUNTER — Telehealth: Payer: Self-pay

## 2023-01-06 ENCOUNTER — Telehealth: Payer: Self-pay | Admitting: Internal Medicine

## 2023-01-06 NOTE — Telephone Encounter (Signed)
-----   Message from [facility] sent at 01/06/2023  4:26 AM EST ----- Notify - cholesterol is significantly elevated.  Have discussed the need to be on a cholesterol medication. She has previously declined. If agreeable, start crestor 20mg  q day.  Will need liver panel checked 6 weeks after starting. Kidney function ok. Overall sugar control wnl. B12 level is in the normal range, but at the low end of normal. See if taking any B12.  If no, then have her start oral B12 q day. If already taking, then I would like to start B12 injections q week x 4 weeks and then q month. Hgb, thyroid test and liver function tests are wnl.

## 2023-01-06 NOTE — Telephone Encounter (Signed)
Patient called for lab results, she does not use her MyChart. Patient requests that office call her tomorrow afternoon, 01/07/2023, she is currently having phone issues.

## 2023-01-06 NOTE — Telephone Encounter (Signed)
See result note.  

## 2023-01-26 ENCOUNTER — Telehealth: Payer: Self-pay

## 2023-01-26 ENCOUNTER — Encounter: Payer: Self-pay | Admitting: Internal Medicine

## 2023-01-26 NOTE — Telephone Encounter (Signed)
 Noted.

## 2023-01-26 NOTE — Telephone Encounter (Signed)
 Copied from CRM [id number]. Topic: General - Other >> Jan 26, 2023  1:41 PM Melissa C wrote: Reason for CRM: patient missed her Medicare Wellness Visit as she was confused in the first place as to why she was having another visit since she just had a visit with a clean bill of health. I explained to her that the Medicare visits are completely separate from regular physicals and generally done just by telephone. She is now understanding and just needs the Medicare visit to be rescheduled please. Thank you.  I called patient and there was no answer and no voicemail.  Patient has not used MyChart in years, so I mailed a letter to her, asking her to please call us  to reschedule this visit.

## 2023-02-11 DIAGNOSIS — I10 Essential (primary) hypertension: Secondary | ICD-10-CM | POA: Diagnosis not present

## 2023-02-11 DIAGNOSIS — J301 Allergic rhinitis due to pollen: Secondary | ICD-10-CM | POA: Diagnosis not present

## 2023-02-11 DIAGNOSIS — J4521 Mild intermittent asthma with (acute) exacerbation: Secondary | ICD-10-CM | POA: Diagnosis not present

## 2023-03-03 ENCOUNTER — Telehealth: Payer: Self-pay | Admitting: Internal Medicine

## 2023-03-03 NOTE — Telephone Encounter (Signed)
Copied from CRM [id number]. Topic: Medicare AWV >> Mar 03, 2023  9:34 AM Payton Doughty wrote: Reason for CRM: Called LVM 03/03/2023 to schedule AWV. Please schedule office or virtual visits.  Verlee Rossetti; Care Guide Ambulatory Clinical Support [facility] Direct Dial: [phone]

## 2023-03-04 ENCOUNTER — Telehealth: Payer: Self-pay

## 2023-03-04 NOTE — Telephone Encounter (Signed)
Copied from CRM [id number]. Topic: Appointments - Appointment Info/Confirmation >> Mar 04, 2023  9:34 AM Alona Bene A wrote: Patient called in regarding vm left for her to schedule AWV. Patient stated that she has appointment 07/06/23 for physical and would like to complete AWV at that appointment. Patient stated she only wants to come in office twice a year if need be. Please contact patient with any additional questions.  I called patient's home number, no answer and no voicemail. I called patient's mobile number and spoke with patient.  I explained to patient that her AWV is a telephone visit.  I scheduled patient to have her AWV on 03/06/2023.

## 2023-03-06 ENCOUNTER — Ambulatory Visit: Payer: Self-pay | Admitting: Internal Medicine

## 2023-03-06 NOTE — Telephone Encounter (Signed)
Given that she is already on antibiotics (two) and having chest pain and persistent symptoms, she needs to be seen today.

## 2023-03-06 NOTE — Telephone Encounter (Signed)
This RN started to assess patient and then the call disconnected. Attempted to call patient back on both phone numbers on file and unable to reach patient. Left message for patient to call back.

## 2023-03-06 NOTE — Telephone Encounter (Signed)
Spoke with pt and she stated that she would try to make arrangements to get over to [facility] either this evening or sometime this weekend.

## 2023-03-06 NOTE — Telephone Encounter (Signed)
Copied from CRM [id number]. Topic: General - Other >> Mar 06, 2023  3:14 PM Whitney O wrote: Reason for CRM: patient calling cause for about 2 weeks or more I've been to Dr Meredeth Ide about bad cough and hurting in my chest real bad and dr Meredeth Ide gave me a z pack . It didn't get better it just kind of stayed the same called him back and he gave me something . I have to take this twice a day . The name of medication doxycycline, to be real honest . Its not getting any better and there is noise happening inside of my chest somewhere is real loud and when I lay down it gets louder . Always been a real good sleeper and for the last month I have not had any sleep because of the noise . Doesn't feel like anything is wrong with my heart . The noise thing I dont understand . But thinking that taking the medicine thinking it would be ok   Chief Complaint: Cough Symptoms: Cough, runny nose, chest tightness, wheezing Frequency: Frequent  Pertinent Negatives: Patient denies fever, shortness of breath Disposition: [x] ED /[] Urgent Care (no appt availability in office) / [] Appointment(In office/virtual)/ []  [facility]/ [] Home Care/ [] Refused Recommended Disposition /[] [place] [facility]/ []  Follow-up with PCP Additional Notes: Patient's husband called with the patient to report that the patient has been sick with a cough for the last 2-3 weeks. He states that she was given a Z-Pak which she took without improvement. He states that she was then prescribed Doxycyline which she has also been taking without improvement, and has 3 days left to take. The patient reports that she is also experiencing wheezing which is worse at night, and chest pressure that is present throughout the day and worsens at night. Patient advised that with her chest pressure and other symptoms, as well as being given two courses of antibiotics without any improvement, that she should go to the ED for further evaluation and treatment.  Patient states that she will not go to the ED and will call back on Monday to try and reach her PCP.    Reason for Disposition  Chest pain  (Exception: MILD central chest pain, present only when coughing.)  Answer Assessment - Initial Assessment Questions 1. ONSET: "When did the cough begin?"      2-3 weeks ago  2. SEVERITY: "How bad is the cough today?"      Mild now, worse when laying down  3. SPUTUM: "Describe the color of your sputum" (none, dry cough; clear, white, yellow, green)     Clear  4. HEMOPTYSIS: "Are you coughing up any blood?" If so ask: "How much?" (flecks, streaks, tablespoons, etc.)     No 5. DIFFICULTY BREATHING: "Are you having difficulty breathing?" If Yes, ask: "How bad is it?" (e.g., mild, moderate, severe)    - MILD: No SOB at rest, mild SOB with walking, speaks normally in sentences, can lie down, no retractions, pulse < 100.    - MODERATE: SOB at rest, SOB with minimal exertion and prefers to sit, cannot lie down flat, speaks in phrases, mild retractions, audible wheezing, pulse 100-120.    - SEVERE: Very SOB at rest, speaks in single words, struggling to breathe, sitting hunched forward, retractions, pulse > 120      No difficulty breathing, but raspy sound noticed  6. FEVER: "Do you have a fever?" If Yes, ask: "What is your temperature, how was it measured, and  when did it start?"     No 7. CARDIAC HISTORY: "Do you have any history of heart disease?" (e.g., heart attack, congestive heart failure)      Atrial fibrillation  8. LUNG HISTORY: "Do you have any history of lung disease?"  (e.g., pulmonary embolus, asthma, emphysema)     No 9. PE RISK FACTORS: "Do you have a history of blood clots?" (or: recent major surgery, recent prolonged travel, bedridden)     No 10. OTHER SYMPTOMS: "Do you have any other symptoms?" (e.g., runny nose, wheezing, chest pain)       Wheezing, runny nose, chest tightness  11. PREGNANCY: "Is there any chance you are pregnant?" "When  was your last menstrual period?"       No 12. TRAVEL: "Have you traveled out of the country in the last month?" (e.g., travel history, exposures)       No  Protocols used: Cough - Acute Productive-A-AH

## 2023-03-06 NOTE — Telephone Encounter (Signed)
Copied from CRM [id number]. Topic: General - Other >> Mar 06, 2023  3:14 PM Whitney O wrote: Reason for CRM: patient calling cause for about 2 weeks or more I've been to Dr Meredeth Ide about bad cough and hurting in my chest real bad and dr Meredeth Ide gave me a z pack . It didn't get better it just kind of stayed the same called him back and he gave me something . I have to take this twice a day . The name of medication doxycycline, to be real honest . Its not getting any better and there is noise happening inside of my chest somewhere is real loud and when I lay down it gets louder . Always been a real good sleeper and for the last month I have not had any sleep because of the noise . Doesn't feel like anything is wrong with my heart . The noise thing I dont understand . But thinking that taking the medicine thinking it would be ok

## 2023-03-06 NOTE — Telephone Encounter (Signed)
Pt was advised the ED refused and stated that they would call back on Monday.

## 2023-03-17 ENCOUNTER — Telehealth: Payer: Self-pay

## 2023-03-17 ENCOUNTER — Other Ambulatory Visit: Payer: Self-pay

## 2023-03-17 ENCOUNTER — Emergency Department
Admission: EM | Admit: 2023-03-17 | Discharge: 2023-03-17 | Disposition: A | Discharge status: Home / Self Care | Payer: HMO | Attending: Emergency Medicine | Admitting: Emergency Medicine

## 2023-03-17 ENCOUNTER — Encounter: Payer: Self-pay | Admitting: Emergency Medicine

## 2023-03-17 ENCOUNTER — Emergency Department: Payer: HMO

## 2023-03-17 DIAGNOSIS — J4521 Mild intermittent asthma with (acute) exacerbation: Secondary | ICD-10-CM | POA: Diagnosis not present

## 2023-03-17 DIAGNOSIS — R059 Cough, unspecified: Secondary | ICD-10-CM | POA: Diagnosis not present

## 2023-03-17 DIAGNOSIS — Z7901 Long term (current) use of anticoagulants: Secondary | ICD-10-CM | POA: Insufficient documentation

## 2023-03-17 DIAGNOSIS — R0602 Shortness of breath: Secondary | ICD-10-CM | POA: Diagnosis not present

## 2023-03-17 DIAGNOSIS — I10 Essential (primary) hypertension: Secondary | ICD-10-CM | POA: Diagnosis not present

## 2023-03-17 DIAGNOSIS — R062 Wheezing: Secondary | ICD-10-CM

## 2023-03-17 DIAGNOSIS — J449 Chronic obstructive pulmonary disease, unspecified: Secondary | ICD-10-CM | POA: Diagnosis not present

## 2023-03-17 LAB — CBC
HCT: 34.4 % — ABNORMAL LOW (ref 36.0–46.0)
Hemoglobin: 12.6 g/dL (ref 12.0–15.0)
MCH: 35.5 pg — ABNORMAL HIGH (ref 26.0–34.0)
MCHC: 36.6 g/dL — ABNORMAL HIGH (ref 30.0–36.0)
MCV: 96.9 fL (ref 80.0–100.0)
Platelets: 273 10*3/uL (ref 150–400)
RBC: 3.55 MIL/uL — ABNORMAL LOW (ref 3.87–5.11)
RDW: 13.7 % (ref 11.5–15.5)
WBC: 6.6 10*3/uL (ref 4.0–10.5)
nRBC: 0 % (ref 0.0–0.2)

## 2023-03-17 LAB — RESP PANEL BY RT-PCR (RSV, FLU A&B, COVID)  RVPGX2
Influenza A by PCR: NEGATIVE
Influenza B by PCR: NEGATIVE
Resp Syncytial Virus by PCR: NEGATIVE
SARS Coronavirus 2 by RT PCR: NEGATIVE

## 2023-03-17 LAB — TROPONIN I (HIGH SENSITIVITY): Troponin I (High Sensitivity): 6 ng/L (ref <18)

## 2023-03-17 LAB — BASIC METABOLIC PANEL
Anion gap: 9 (ref 5–15)
BUN: 29 mg/dL — ABNORMAL HIGH (ref 8–23)
CO2: 23 mmol/L (ref 22–32)
Calcium: 9 mg/dL (ref 8.9–10.3)
Chloride: 105 mmol/L (ref 98–111)
Creatinine, Ser: 0.8 mg/dL (ref 0.44–1.00)
GFR, Estimated: >60 mL/min (ref >60)
Glucose, Bld: 101 mg/dL — ABNORMAL HIGH (ref 70–99)
Potassium: 4 mmol/L (ref 3.5–5.1)
Sodium: 137 mmol/L (ref 135–145)

## 2023-03-17 LAB — BRAIN NATRIURETIC PEPTIDE: B Natriuretic Peptide: 46.9 pg/mL (ref 0.0–100.0)

## 2023-03-17 MED ORDER — IPRATROPIUM-ALBUTEROL 0.5-2.5 (3) MG/3ML IN SOLN
3.0000 mL | Freq: Once | RESPIRATORY_TRACT | Status: AC
  Administered 2023-03-17: 3 mL via RESPIRATORY_TRACT
  Filled 2023-03-17: qty 3

## 2023-03-17 MED ORDER — PREDNISONE 10 MG PO TABS: 40.0000 mg | ORAL_TABLET | Freq: Every day | ORAL | 0 refills | Status: AC

## 2023-03-17 MED ORDER — PREDNISONE 20 MG PO TABS
40.0000 mg | ORAL_TABLET | Freq: Once | ORAL | Status: AC
Start: 2023-03-17 — End: 2023-03-17
  Administered 2023-03-17: 40 mg via ORAL
  Filled 2023-03-17: qty 2

## 2023-03-17 MED ORDER — ALBUTEROL SULFATE HFA 108 (90 BASE) MCG/ACT IN AERS: 2.0000 | INHALATION_SPRAY | Freq: Four times a day (QID) | RESPIRATORY_TRACT | 2 refills | Status: AC | PRN

## 2023-03-17 NOTE — Discharge Instructions (Signed)
 Your symptoms appear most consistent with possible asthma exacerbation today.  I have sent an albuterol inhaler as well as 4 additional days of steroids to your pharmacy.  Please take these as prescribed.  Please follow-up with your primary care provider within a week for reassessment.

## 2023-03-17 NOTE — Telephone Encounter (Signed)
 Copied from CRM [id number]. Topic: Clinical - Medical Advice >> Mar 17, 2023  4:29 PM Alcus Dad wrote: Reason for CRM: Patient stated that Dr. Anner Crete stated to inform Dr. Rennis Petty what took place today. Wells did a breathing treatment and got the phlegm out of lungs. They ran a lot of different to test to see what's going on. Patient had Shortness of breath to no breath. She is feeling much better and just wanted Dr. Lorin Picket to know

## 2023-03-17 NOTE — ED Triage Notes (Signed)
 First Nurse Note: Patient to ED from [facility] for SOB and chest pressure. Has not been taking her medications as prescribed. Ongoing 6-8 weeks. Dyspnea with exertion and unable to lay flat. Hypertensive with KC.

## 2023-03-17 NOTE — ED Provider Notes (Signed)
 [facility] Provider Note    Event Date/Time   First MD Initiated Contact with Patient 03/17/23 1047     (approximate)   History   Shortness of Breath   HPI Kathleen Wallace is a 89 y.o. female with history of HTN, A-fib on Eliquis presenting today for shortness of breath.  Patient states for the past 2 months she has had intermittent shortness of breath, congestion, cough, and wheezing.  She saw her pulmonologist 1 month ago who diagnosed her with asthmatic bronchitis and discharged her on a Z-Pak.  She reported no symptomatic improvement but was not taking albuterol as prescribed.  She has had some mild chest pressure for 2 months without any acute worsening recently.  She has shortness of breath when she lays flat at night.  No swelling to her legs.  Otherwise denies fever, chills, nausea, vomiting, abdominal pain, leg pain, leg swelling.  Has not missed any dose of her Eliquis.  Chart review: Patient with outpatient echo in 2021.  This showed normal EF but signs of grade 1 diastolic dysfunction.     Physical Exam   Triage Vital Signs: ED Triage Vitals [03/17/23 1044]  Encounter Vitals Group     BP (!) 173/86     Systolic BP Percentile      Diastolic BP Percentile      Pulse Rate 91     Resp 18     Temp 98.2 F (36.8 C)     Temp Source Oral     SpO2 95 %     Weight 118 lb (53.5 kg)     Height 5\' 1"  (1.549 m)     Head Circumference      Peak Flow      Pain Score 0     Pain Loc      Pain Education      Exclude from Growth Chart     Most recent vital signs: Vitals:   03/17/23 1044  BP: (!) 173/86  Pulse: 91  Resp: 18  Temp: 98.2 F (36.8 C)  SpO2: 95%   Physical Exam: I have reviewed the vital signs and nursing notes. General: Awake, alert, no acute distress.  Nontoxic appearing. Head:  Atraumatic, normocephalic.   ENT:  EOM intact, PERRL. Oral mucosa is pink and moist with no lesions. Neck: Neck is supple with full range of motion, No  meningeal signs. Cardiovascular:  RRR, No murmurs. Peripheral pulses palpable and equal bilaterally. Respiratory:  Symmetrical chest wall expansion.  Mild scattered wheezing throughout bilateral lung fields.  Good air movement throughout.  No use of accessory muscles.   Musculoskeletal:  No cyanosis or edema. Moving extremities with full ROM Abdomen:  Soft, nontender, nondistended. Neuro:  GCS 15, moving all four extremities, interacting appropriately. Speech clear. Psych:  Calm, appropriate.   Skin:  Warm, dry, no rash.    ED Results / Procedures / Treatments   Labs (all labs ordered are listed, but only abnormal results are displayed) Labs Reviewed  BASIC METABOLIC PANEL - Abnormal; Notable for the following components:      Result Value   Glucose, Bld 101 (*)    BUN 29 (*)    All other components within normal limits  CBC - Abnormal; Notable for the following components:   RBC 3.55 (*)    HCT 34.4 (*)    MCH 35.5 (*)    MCHC 36.6 (*)    All other components within normal limits  RESP  PANEL BY RT-PCR (RSV, FLU A&B, COVID)  RVPGX2  BRAIN NATRIURETIC PEPTIDE  TROPONIN I (HIGH SENSITIVITY)     EKG My EKG interpretation: Rate of 85, normal sinus rhythm.  Normal axis, normal intervals.  Possible left bundle branch block.  No other acute ST elevation or depression.   RADIOLOGY Independently interpreted chest x-ray with no acute pathology   PROCEDURES:  Critical Care performed: No  Procedures   MEDICATIONS ORDERED IN ED: Medications  predniSONE (DELTASONE) tablet 40 mg (has no administration in time range)  ipratropium-albuterol (DUONEB) 0.5-2.5 (3) MG/3ML nebulizer solution 3 mL (3 mLs Nebulization Given 03/17/23 1152)     IMPRESSION / MDM / ASSESSMENT AND PLAN / ED COURSE  I reviewed the triage vital signs and the nursing notes.                              Differential diagnosis includes, but is not limited to, asthma exacerbation, bronchitis, pneumonia, flu,  RSV, COVID, pneumothorax, heart failure  Patient's presentation is most consistent with acute complicated illness / injury requiring diagnostic workup.  Patient is an 89 year old female presenting today for 2 months of cough, shortness of breath, and wheezing.  On exam she has scattered wheezing noted throughout all lung fields but no tachypnea, accessory muscle usage, or other acute lung pathology.  Vital signs otherwise stable outside of hypertension.  No hypoxia.  Patient had resolution of all wheezing symptoms following DuoNeb treatment.  Laboratory workup otherwise reassuring.  EKG and troponin negative for ischemic pathology.  CBC and BMP largely unremarkable.  BNP negative for signs of heart failure and no edema to lower extremities.  Negative for COVID, flu, RSV.  Suspect symptoms may be bronchitis versus asthma at this time.  She is otherwise stable with no tachypnea or hypoxia.  Will send home with albuterol inhaler and prednisone treatment.  Told to follow-up with PCP and given strict return precautions.  The patient is on the cardiac monitor to evaluate for evidence of arrhythmia and/or significant heart rate changes.     FINAL CLINICAL IMPRESSION(S) / ED DIAGNOSES   Final diagnoses:  Mild intermittent asthma with exacerbation  Wheezing     Rx / DC Orders   ED Discharge Orders          Ordered    predniSONE (DELTASONE) 10 MG tablet  Daily        03/17/23 1329    albuterol (VENTOLIN HFA) 108 (90 Base) MCG/ACT inhaler  Every 6 hours PRN        03/17/23 1329             Note:  This document was prepared using Dragon voice recognition software and may include unintentional dictation errors.   Janith Lima, MD 03/17/23 1330

## 2023-03-18 ENCOUNTER — Telehealth: Payer: Self-pay

## 2023-03-18 NOTE — Telephone Encounter (Signed)
 FYI for you.

## 2023-03-18 NOTE — Telephone Encounter (Signed)
 Noted.  Thank you.  Just let me know if I need to do anything or if she needs anything.

## 2023-03-18 NOTE — Telephone Encounter (Signed)
 FYI for you. I have LM to follow up with the patient on how she is doing.

## 2023-03-18 NOTE — Telephone Encounter (Signed)
 Copied from CRM [id number]. Topic: General - Call Back - No Documentation >> Mar 18, 2023 11:14 AM Adele Barthel wrote: Reason for CRM:   Patient is returning call from provider's nurse concerning her visit at urgent care yesterday. Advised her they were calling to check in on her after her visit. She is feeling much better and is able to breath much better. She was prescribed prednisone and albuterol at urgent care, and said they are helping.

## 2023-03-18 NOTE — Transitions of Care (Post Inpatient/ED Visit) (Signed)
   03/18/2023  Name: YENNIFER SEGOVIA MRN: [id number] DOB: [birth year]  Today's TOC FU Call Status: Today's TOC FU Call Status:: Unsuccessful Call (1st Attempt) Unsuccessful Call (1st Attempt) Date: 03/18/23  Attempted to reach the patient regarding the most recent Inpatient/ED visit.  Follow Up Plan: Additional outreach attempts will be made to reach the patient to complete the Transitions of Care (Post Inpatient/ED visit) call.   Signature Agnes Lawrence, CMA (AAMA)  [facility] [phone]

## 2023-03-18 NOTE — Telephone Encounter (Signed)
 Noted. See other message. Let me know if I need to do anything or if she needs anything.

## 2023-03-20 NOTE — Telephone Encounter (Signed)
 Pt called back to return the nurses call. She mentioned that the urgent care was asking her about COPD but she is unsure why and would like a nurse to give her a call back for further explanation and what she was tested for. Please call back and advise.

## 2023-03-23 NOTE — Telephone Encounter (Signed)
 Spoke with patient to let her know that I did not see COPD listed in her chart. ED listed dx as asthma as well as pulmonary MD. Patient confirmed doing ok and feeling better.

## 2023-03-23 NOTE — Telephone Encounter (Signed)
 LMTCB

## 2023-03-25 DIAGNOSIS — H3581 Retinal edema: Secondary | ICD-10-CM | POA: Diagnosis not present

## 2023-03-25 DIAGNOSIS — H26491 Other secondary cataract, right eye: Secondary | ICD-10-CM | POA: Diagnosis not present

## 2023-03-25 DIAGNOSIS — Z961 Presence of intraocular lens: Secondary | ICD-10-CM | POA: Diagnosis not present

## 2023-03-25 DIAGNOSIS — H401133 Primary open-angle glaucoma, bilateral, severe stage: Secondary | ICD-10-CM | POA: Diagnosis not present

## 2023-03-25 DIAGNOSIS — H35372 Puckering of macula, left eye: Secondary | ICD-10-CM | POA: Diagnosis not present

## 2023-04-21 ENCOUNTER — Emergency Department
Admission: EM | Admit: 2023-04-21 | Discharge: 2023-04-21 | Disposition: A | Discharge status: Home / Self Care | Attending: Emergency Medicine | Admitting: Emergency Medicine

## 2023-04-21 ENCOUNTER — Other Ambulatory Visit: Payer: Self-pay

## 2023-04-21 ENCOUNTER — Emergency Department

## 2023-04-21 DIAGNOSIS — R0602 Shortness of breath: Secondary | ICD-10-CM | POA: Diagnosis not present

## 2023-04-21 DIAGNOSIS — R079 Chest pain, unspecified: Secondary | ICD-10-CM | POA: Diagnosis not present

## 2023-04-21 DIAGNOSIS — J441 Chronic obstructive pulmonary disease with (acute) exacerbation: Secondary | ICD-10-CM | POA: Diagnosis not present

## 2023-04-21 DIAGNOSIS — I10 Essential (primary) hypertension: Secondary | ICD-10-CM | POA: Diagnosis not present

## 2023-04-21 DIAGNOSIS — R9389 Abnormal findings on diagnostic imaging of other specified body structures: Secondary | ICD-10-CM | POA: Diagnosis not present

## 2023-04-21 LAB — BASIC METABOLIC PANEL WITH GFR
Anion gap: 10 (ref 5–15)
BUN: 25 mg/dL — ABNORMAL HIGH (ref 8–23)
CO2: 22 mmol/L (ref 22–32)
Calcium: 9.1 mg/dL (ref 8.9–10.3)
Chloride: 105 mmol/L (ref 98–111)
Creatinine, Ser: 0.79 mg/dL (ref 0.44–1.00)
GFR, Estimated: >60 mL/min (ref >60)
Glucose, Bld: 106 mg/dL — ABNORMAL HIGH (ref 70–99)
Potassium: 4.8 mmol/L (ref 3.5–5.1)
Sodium: 137 mmol/L (ref 135–145)

## 2023-04-21 LAB — CBC
HCT: 44.5 % (ref 36.0–46.0)
Hemoglobin: 16.6 g/dL — ABNORMAL HIGH (ref 12.0–15.0)
MCH: 35.8 pg — ABNORMAL HIGH (ref 26.0–34.0)
MCHC: 37.3 g/dL — ABNORMAL HIGH (ref 30.0–36.0)
MCV: 95.9 fL (ref 80.0–100.0)
Platelets: 216 10*3/uL (ref 150–400)
RBC: 4.64 MIL/uL (ref 3.87–5.11)
RDW: 13.8 % (ref 11.5–15.5)
WBC: 6.7 10*3/uL (ref 4.0–10.5)
nRBC: 0 % (ref 0.0–0.2)

## 2023-04-21 LAB — RESP PANEL BY RT-PCR (RSV, FLU A&B, COVID)  RVPGX2
Influenza A by PCR: NEGATIVE
Influenza B by PCR: NEGATIVE
Resp Syncytial Virus by PCR: NEGATIVE
SARS Coronavirus 2 by RT PCR: NEGATIVE

## 2023-04-21 LAB — TROPONIN I (HIGH SENSITIVITY)
Troponin I (High Sensitivity): 9 ng/L (ref <18)
Troponin I (High Sensitivity): 10 ng/L (ref <18)

## 2023-04-21 LAB — APTT: aPTT: 29 s (ref 24–36)

## 2023-04-21 MED ORDER — IPRATROPIUM-ALBUTEROL 0.5-2.5 (3) MG/3ML IN SOLN
3.0000 mL | Freq: Once | RESPIRATORY_TRACT | Status: AC
  Administered 2023-04-21: 3 mL via RESPIRATORY_TRACT
  Filled 2023-04-21: qty 3

## 2023-04-21 MED ORDER — METHYLPREDNISOLONE SODIUM SUCC 125 MG IJ SOLR
125.0000 mg | Freq: Once | INTRAMUSCULAR | Status: AC
  Administered 2023-04-21: 125 mg via INTRAVENOUS
  Filled 2023-04-21: qty 2

## 2023-04-21 MED ORDER — PREDNISONE 20 MG PO TABS: ORAL_TABLET | ORAL | 0 refills | Status: AC

## 2023-04-21 NOTE — ED Triage Notes (Signed)
 Pt here from [facility] with CP and SOB since Jan but getting worse. Pt wheezing on arrival to ED. Pt states CP is left sided but she feels it all over her chest. Pt states pain is squeezing in nature and has been constant. Pt denies NVD.

## 2023-04-21 NOTE — Discharge Instructions (Signed)
 Take the prednisone as prescribed for the full 9-day tapering course.  Use albuterol via inhaler or nebulizer every 4-6 hours for the next several days.  Follow-up with your primary care doctor and pulmonologist.  Return to the ER for new, worsening, or persistent severe shortness of breath, wheezing, or any other new or worsening symptoms that concern you.

## 2023-04-21 NOTE — ED Provider Notes (Signed)
 [facility] Provider Note    Event Date/Time   First MD Initiated Contact with Patient 04/21/23 1514     (approximate)   History   Chest Pain and Shortness of Breath   HPI  Kathleen Wallace is a 89 y.o. female with a history of hypertension, hypercholesterolemia, atrial fibrillation, COPD, and GERD who presents with on and off waxing waning shortness of breath for the last 3 months, worsened in the last few days, and associated with cough and wheezing.  The patient states that symptoms are worse at night.  She states that she has seen her pulmonologist multiple times.  She was also seen in the ED about a month ago and the treatment she received at that time was helpful, but this only lasted temporarily.  She denies any fever.  She has no chest pain.  The patient denies any leg swelling.  I reviewed past medical records.  The patient's most recent outpatient counter was with internal medicine on 2/25 for symptoms of COPD.   Physical Exam   Triage Vital Signs: ED Triage Vitals  Encounter Vitals Group     BP 04/21/23 1025 (!) 195/104     Systolic BP Percentile --      Diastolic BP Percentile --      Pulse Rate 04/21/23 1024 100     Resp 04/21/23 1024 18     Temp 04/21/23 1024 98.5 F (36.9 C)     Temp Source 04/21/23 1024 Oral     SpO2 04/21/23 1024 93 %     Weight 04/21/23 1024 117 lb 15.1 oz (53.5 kg)     Height 04/21/23 1024 5\' 1"  (1.549 m)     Head Circumference --      Peak Flow --      Pain Score 04/21/23 1024 9     Pain Loc --      Pain Education --      Exclude from Growth Chart --     Most recent vital signs: Vitals:   04/21/23 1705 04/21/23 1740  BP:  119/79  Pulse: 77 87  Resp: 16 18  Temp:    SpO2: 95% 95%     General: Alert, comfortable appearing, no distress.  CV:  Good peripheral perfusion.  Resp:  Increased effort.  Diffuse wheezing and coarse breath sounds bilaterally. Abd:  No distention.  Other:  No peripheral  edema.   ED Results / Procedures / Treatments   Labs (all labs ordered are listed, but only abnormal results are displayed) Labs Reviewed  BASIC METABOLIC PANEL WITH GFR - Abnormal; Notable for the following components:      Result Value   Glucose, Bld 106 (*)    BUN 25 (*)    All other components within normal limits  CBC - Abnormal; Notable for the following components:   Hemoglobin 16.6 (*)    MCH 35.8 (*)    MCHC 37.3 (*)    All other components within normal limits  RESP PANEL BY RT-PCR (RSV, FLU A&B, COVID)  RVPGX2  APTT  TROPONIN I (HIGH SENSITIVITY)  TROPONIN I (HIGH SENSITIVITY)     EKG  ED ECG REPORT I, Dionne Bucy, the attending physician, personally viewed and interpreted this ECG.  Date: 04/21/2023 EKG Time: 1027 Rate: 94 Rhythm: normal sinus rhythm QRS Axis: Left axis Intervals: LBBB ST/T Wave abnormalities: normal Narrative Interpretation: no evidence of acute ischemia    RADIOLOGY  Chest x-ray: I independently viewed and  interpreted the images; there is no focal consolidation or edema   PROCEDURES:  Critical Care performed: No  Procedures   MEDICATIONS ORDERED IN ED: Medications  ipratropium-albuterol (DUONEB) 0.5-2.5 (3) MG/3ML nebulizer solution 3 mL (3 mLs Nebulization Given 04/21/23 1545)  ipratropium-albuterol (DUONEB) 0.5-2.5 (3) MG/3ML nebulizer solution 3 mL (3 mLs Nebulization Given 04/21/23 1545)  methylPREDNISolone sodium succinate (SOLU-MEDROL) 125 mg/2 mL injection 125 mg (125 mg Intravenous Given 04/21/23 1545)     IMPRESSION / MDM / ASSESSMENT AND PLAN / ED COURSE  I reviewed the triage vital signs and the nursing notes.  89 year old female with PMH as noted above presents with worsening shortness of breath, with symptoms ongoing for the last 3 months but waxing and waning in intensity.  On exam her O2 saturation is in the high 90s on room air.  Her work of breathing slightly increased but she has no acute respiratory  distress.  There is diffuse wheezing and coarse breath sounds bilaterally.  There is no edema.  Differential diagnosis includes, but is not limited to, COPD exacerbation, acute bronchitis, influenza or other viral syndrome, bacterial pneumonia, less likely cardiac etiology.  BMP and CBC show no concerning acute findings.  Troponin is negative.  Chest x-ray is clear.  I have added on a respiratory panel.  Will give bronchodilators, steroid, and reassess.  Patient's presentation is most consistent with acute presentation with potential threat to life or bodily function.  The patient is on the cardiac monitor to evaluate for evidence of arrhythmia and/or significant heart rate changes.   ----------------------------------------- 5:43 PM on 04/21/2023 -----------------------------------------  Respiratory panel is negative.  On reassessment, the patient states she is feeling significantly better.  She is stable for discharge home at this time.  O2 saturation is 97% on room air.  I we will prescribe a slightly longer steroid course with a taper given that she responded well to this before but felt that her symptoms resumed as soon as she was done with the steroid.  I gave strict return precautions and the patient expressed understanding.  FINAL CLINICAL IMPRESSION(S) / ED DIAGNOSES   Final diagnoses:  COPD exacerbation (HCC)     Rx / DC Orders   ED Discharge Orders          Ordered    predniSONE (DELTASONE) 20 MG tablet  Q breakfast        04/21/23 1742             Note:  This document was prepared using Dragon voice recognition software and may include unintentional dictation errors.    Dionne Bucy, MD 04/21/23 1744

## 2023-04-21 NOTE — ED Notes (Signed)
Pt verbalizes understanding of discharge instructions. Opportunity for questioning and answers were provided. Pt discharged from ED to home with husband.    

## 2023-04-21 NOTE — ED Triage Notes (Signed)
 Brought over from [facility]. C/o sob and cp X3 months but worsened 2 days ago. Unable to lay down because it makes cp worse.   History COPD and asthma   KC vitals: 94% RA

## 2023-04-22 ENCOUNTER — Telehealth: Payer: Self-pay

## 2023-04-22 NOTE — Transitions of Care (Post Inpatient/ED Visit) (Unsigned)
   04/22/2023  Name: Kathleen Wallace MRN: [id number] DOB: Sep 10, 1933  Today's TOC FU Call Status: Today's TOC FU Call Status:: Unsuccessful Call (1st Attempt) Unsuccessful Call (1st Attempt) Date: 04/22/23  Attempted to reach the patient regarding the most recent Inpatient/ED visit.  Follow Up Plan: Additional outreach attempts will be made to reach the patient to complete the Transitions of Care (Post Inpatient/ED visit) call.   Signature Karena Addison, LPN [facility] Nurse Health Advisor Direct Dial [phone]

## 2023-04-23 NOTE — Transitions of Care (Post Inpatient/ED Visit) (Signed)
   04/23/2023  Name: Kathleen Wallace MRN: [id number] DOB: [birth year]  Today's TOC FU Call Status: Today's TOC FU Call Status:: Unsuccessful Call (2nd Attempt) Unsuccessful Call (1st Attempt) Date: 04/22/23 Unsuccessful Call (2nd Attempt) Date: 04/23/23  Attempted to reach the patient regarding the most recent Inpatient/ED visit.  Follow Up Plan: Additional outreach attempts will be made to reach the patient to complete the Transitions of Care (Post Inpatient/ED visit) call.   Signature Karena Addison, LPN [facility] Nurse Health Advisor Direct Dial [phone]

## 2023-04-23 NOTE — Transitions of Care (Post Inpatient/ED Visit) (Signed)
 04/23/2023  Name: Kathleen Wallace MRN: [id number] DOB: 1933/07/31  Today's TOC FU Call Status: Today's TOC FU Call Status:: Successful TOC FU Call Completed Unsuccessful Call (1st Attempt) Date: 04/22/23 Unsuccessful Call (2nd Attempt) Date: 04/23/23 [facility] FU Call Complete Date: 04/23/23 Patient's Name and Date of Birth confirmed.  Transition Care Management Follow-up Telephone Call Date of Discharge: 04/21/23 Discharge Facility: [facility]) Type of Discharge: Emergency Department Reason for ED Visit: Respiratory (COPD) Respiratory Diagnosis: COPD Exacerbation How have you been since you were released from the hospital?: Better Any questions or concerns?: No  Items Reviewed: Did you receive and understand the discharge instructions provided?: Yes Medications obtained,verified, and reconciled?: Yes (Medications Reviewed) Any new allergies since your discharge?: No Dietary orders reviewed?: Yes Do you have support at home?: Yes People in Home: spouse  Medications Reviewed Today: Medications Reviewed Today     Reviewed by Karena Addison, LPN (Licensed Practical Nurse) on 04/23/23 at 1550  Med List Status: <None>   Medication Order Taking? Sig Documenting Provider Last Dose Status Informant  albuterol (VENTOLIN HFA) 108 (90 Base) MCG/ACT inhaler [id number]  Inhale 2 puffs into the lungs every 6 (six) hours as needed for wheezing or shortness of breath. Janith Lima, MD  Active   amLODipine (NORVASC) 5 MG tablet [id number]  Take 1 tablet (5 mg total) by mouth daily. Dale Hinds, MD  Active   apixaban (ELIQUIS) 2.5 MG TABS tablet [id number]  Take 1 tablet (2.5 mg total) by mouth 2 (two) times daily. Dale Mauldin, MD  Active   Coenzyme Q10 (COQ10) 100 MG CAPS [id number] No Take 100 mg by mouth daily.  [provider] Taking Active Self  dorzolamidel-timolol (COSOPT) 22.3-6.8 MG/ML SOLN ophthalmic solution [id number] No Place 1 drop into both eyes  2 (two) times daily. [provider] Taking Active Self  latanoprost (XALATAN) 0.005 % ophthalmic solution [id number] No Place 1 drop into both eyes at bedtime. [provider] Taking Active Self  Multiple Vitamins-Minerals (MULTIVITAMIN WITH MINERALS) tablet [id number] No Take 1 tablet by mouth daily.  [provider] Taking Active Self  predniSONE (DELTASONE) 20 MG tablet [id number]  Take 3 tablets (60 mg total) by mouth daily with breakfast for 3 days, THEN 2 tablets (40 mg total) daily with breakfast for 3 days, THEN 1 tablet (20 mg total) daily with breakfast for 3 days. Start the day after the ER visit. Dionne Bucy, MD  Active   ROCKLATAN 0.02-0.005 % Criss Rosales [id number] No SMARTSIG:1 Drop(s) In Eye(s) Every Evening [provider] Taking Active   vitamin C (ASCORBIC ACID) 500 MG tablet [id number] No Take 500 mg by mouth daily.  [provider] Taking Active Self            Home Care and Equipment/Supplies: Were Home Health Services Ordered?: NA Any new equipment or medical supplies ordered?: NA  Functional Questionnaire: Do you need assistance with bathing/showering or dressing?: No Do you need assistance with meal preparation?: No Do you need assistance with eating?: No Do you have difficulty maintaining continence: No Do you need assistance with getting out of bed/getting out of a chair/moving?: No Do you have difficulty managing or taking your medications?: No  Follow up appointments reviewed: PCP Follow-up appointment confirmed?: No (declined) MD Provider Line Number:[phone] Given: No Specialist Hospital Follow-up appointment confirmed?: No Reason Specialist Follow-Up Not Confirmed: Patient has Specialist Provider Number and will Call for Appointment Do you need transportation to your follow-up appointment?: No Do  you understand care options if your condition(s) worsen?: Yes-patient verbalized understanding    SIGNATURE  Karena Addison, LPN [facility] Nurse Health Advisor Direct Dial [phone]

## 2023-05-15 DIAGNOSIS — J4521 Mild intermittent asthma with (acute) exacerbation: Secondary | ICD-10-CM | POA: Diagnosis not present

## 2023-06-29 ENCOUNTER — Ambulatory Visit

## 2023-07-06 ENCOUNTER — Ambulatory Visit: Payer: Self-pay | Admitting: Internal Medicine

## 2023-07-06 ENCOUNTER — Ambulatory Visit (INDEPENDENT_AMBULATORY_CARE_PROVIDER_SITE_OTHER): Payer: Medicare HMO | Admitting: Internal Medicine

## 2023-07-06 ENCOUNTER — Encounter: Payer: Self-pay | Admitting: Internal Medicine

## 2023-07-06 ENCOUNTER — Ambulatory Visit (INDEPENDENT_AMBULATORY_CARE_PROVIDER_SITE_OTHER)

## 2023-07-06 VITALS — BP 182/92 | HR 88 | Temp 98.0°F | Resp 16 | Ht 61.0 in | Wt 113.4 lb

## 2023-07-06 DIAGNOSIS — L989 Disorder of the skin and subcutaneous tissue, unspecified: Secondary | ICD-10-CM | POA: Diagnosis not present

## 2023-07-06 DIAGNOSIS — M25511 Pain in right shoulder: Secondary | ICD-10-CM | POA: Diagnosis not present

## 2023-07-06 DIAGNOSIS — E7800 Pure hypercholesterolemia, unspecified: Secondary | ICD-10-CM

## 2023-07-06 DIAGNOSIS — I10 Essential (primary) hypertension: Secondary | ICD-10-CM | POA: Diagnosis not present

## 2023-07-06 DIAGNOSIS — Z0000 Encounter for general adult medical examination without abnormal findings: Secondary | ICD-10-CM

## 2023-07-06 DIAGNOSIS — E079 Disorder of thyroid, unspecified: Secondary | ICD-10-CM | POA: Diagnosis not present

## 2023-07-06 DIAGNOSIS — R739 Hyperglycemia, unspecified: Secondary | ICD-10-CM

## 2023-07-06 DIAGNOSIS — Z1231 Encounter for screening mammogram for malignant neoplasm of breast: Secondary | ICD-10-CM | POA: Diagnosis not present

## 2023-07-06 DIAGNOSIS — M85811 Other specified disorders of bone density and structure, right shoulder: Secondary | ICD-10-CM | POA: Diagnosis not present

## 2023-07-06 DIAGNOSIS — I4891 Unspecified atrial fibrillation: Secondary | ICD-10-CM | POA: Diagnosis not present

## 2023-07-06 DIAGNOSIS — R9389 Abnormal findings on diagnostic imaging of other specified body structures: Secondary | ICD-10-CM | POA: Diagnosis not present

## 2023-07-06 NOTE — Assessment & Plan Note (Signed)
 Physical today 07/06/23. Has previously declined mammogram and colonoscopy. Is agreeable to mammogram today.

## 2023-07-06 NOTE — Progress Notes (Unsigned)
 Subjective:    [name], female    DOB: 1933-05-18, 89 y.o.   MRN: [id number]  Patient here for  Chief Complaint  Patient presents with   Annual Exam    HPI Here for a physical exam. Had f/u with Dr Jamal Mays 05/15/23 - f/u asthma. Recommended albuterol  prn (aero chamber) and flonase  for rhinitis. Previous xray revealed - peripherally calcified breast prosthesis seen bilaterally with suspected rupture of the left sided prosthesis.  Discussed further w/up for the breast finding.  She declines. Previous CT in 2015 revealed a mass arising from the left lobe of thyroid  measuring 1.4 x 1.0 cm. Have discussed with her regarding f/u testing, ultrasound. She has declined.    Past Medical History:  Diagnosis Date   Cancer (HCC)    skin cancer nose   Complication of anesthesia    woke up during partial hysterectomy   Elevated blood pressure    GERD (gastroesophageal reflux disease)    occ-no meds   Headache    h/o    Heart murmur    asymptomatic   Past Surgical History:  Procedure Laterality Date   ABDOMINAL HYSTERECTOMY  1974   partial   BREAST SURGERY     IMPLANTS   CATARACT EXTRACTION W/PHACO Right 02/16/2018   Procedure: CATARACT EXTRACTION PHACO AND INTRAOCULAR LENS PLACEMENT (IOC) RIGHT;  Surgeon: Ola Berger, MD;  Location: [facility];  Service: Ophthalmology;  Laterality: Right;  US   01:10 CDE 11.02 Fluid pack lot # [id number]   CATARACT EXTRACTION W/PHACO Left 03/18/2018   Procedure: CATARACT EXTRACTION PHACO AND INTRAOCULAR LENS PLACEMENT (IOC) LEFT;  Surgeon: Ola Berger, MD;  Location: [facility];  Service: Ophthalmology;  Laterality: Left;  US  01:06.5 CDE 10.80 FLUID PACK LOT # [id number]   LUMBAR LAMINECTOMY/ DECOMPRESSION WITH MET-RX Bilateral 09/20/2018   Procedure: LUMBAR LAMINECTOMY/ DECOMPRESSION WITH MET-RX;  Surgeon: Berta Brittle, MD;  Location: [facility];  Service: Neurosurgery;  Laterality: Bilateral;   TONSILLECTOMY AND ADENOIDECTOMY  1944   Family  History  Problem Relation Age of Onset   Stroke Mother    Hypertension Mother    Stroke Father    Hypertension Father    Diabetes Other    Social History   Socioeconomic History   Marital status: Married    Spouse name: Not on file   Number of children: Not on file   Years of education: Not on file   Highest education level: Not on file  Occupational History   Not on file  Tobacco Use   Smoking status: Never   Smokeless tobacco: Never  Vaping Use   Vaping status: Never Used  Substance and Sexual Activity   Alcohol use: No    Alcohol/week: 0.0 standard drinks of alcohol   Drug use: No   Sexual activity: Not on file  Other Topics Concern   Not on file  Social History Narrative   Not on file   Social Drivers of Health   Financial Resource Strain: Low Risk  (05/15/2023)   Received from [facility]   Overall Financial Resource Strain (CARDIA)    Difficulty of Paying Living Expenses: Not hard at all  Food Insecurity: No Food Insecurity (05/15/2023)   Received from [facility]   Hunger Vital Sign    Within the past 12 months, you worried that your food would run out before you got the money to buy more.: Never true    Within the past 12 months, the  food you bought just didn't last and you didn't have money to get more.: Never true  Transportation Needs: No Transportation Needs (05/15/2023)   Received from [facility] - Transportation    In the past 12 months, has lack of transportation kept you from medical appointments or from getting medications?: No    Lack of Transportation (Non-Medical): No  Physical Activity: Insufficiently Active (01/23/2022)   Exercise Vital Sign    Days of Exercise per Week: 4 days    Minutes of Exercise per Session: 20 min  Stress: No Stress Concern Present (01/23/2022)   [employer] [facility] [employer] - Occupational Stress Questionnaire    Feeling of Stress : Not at all   Social Connections: Unknown (01/23/2022)   Social Connection and Isolation Panel    Frequency of Communication with Friends and Family: Not on file    Frequency of Social Gatherings with Friends and Family: Not on file    Attends Religious Services: Not on file    Active Member of Clubs or Organizations: Not on file    Attends Engineer, structural: Not on file    Marital Status: Married     Review of Systems     Objective:     BP (!) 182/92   Pulse 88   Temp 98 F (36.7 C)   Resp 16   Ht 5' 1 (1.549 m)   Wt 113 lb 6.4 oz (51.4 kg)   SpO2 98%   BMI 21.43 kg/m  Wt Readings from Last 3 Encounters:  07/06/23 113 lb 6.4 oz (51.4 kg)  04/21/23 117 lb 15.1 oz (53.5 kg)  03/17/23 118 lb (53.5 kg)    Physical Exam  {Perform Simple Foot Exam  Perform Detailed exam:1} {Insert foot Exam (Optional):30965}   Outpatient Encounter Medications as of 07/06/2023  Medication Sig   albuterol  (VENTOLIN  HFA) 108 (90 Base) MCG/ACT inhaler Inhale 2 puffs into the lungs every 6 (six) hours as needed for wheezing or shortness of breath.   amLODipine  (NORVASC ) 5 MG tablet Take 1 tablet (5 mg total) by mouth daily.   apixaban  (ELIQUIS ) 2.5 MG TABS tablet Take 1 tablet (2.5 mg total) by mouth 2 (two) times daily.   Coenzyme Q10 (COQ10) 100 MG CAPS Take 100 mg by mouth daily.    dorzolamidel-timolol  (COSOPT ) 22.3-6.8 MG/ML SOLN ophthalmic solution Place 1 drop into both eyes 2 (two) times daily.   latanoprost  (XALATAN ) 0.005 % ophthalmic solution Place 1 drop into both eyes at bedtime.   Multiple Vitamins-Minerals (MULTIVITAMIN WITH MINERALS) tablet Take 1 tablet by mouth daily.    ROCKLATAN 0.02-0.005 % SOLN SMARTSIG:1 Drop(s) In Eye(s) Every Evening   vitamin C (ASCORBIC ACID ) 500 MG tablet Take 500 mg by mouth daily.    No facility-administered encounter medications on file as of 07/06/2023.     Lab Results  Component Value Date   WBC 6.7 04/21/2023   HGB 16.6 (H) 04/21/2023    HCT 44.5 04/21/2023   PLT 216 04/21/2023   GLUCOSE 106 (H) 04/21/2023   CHOL 328 (H) 01/05/2023   TRIG 105.0 01/05/2023   HDL 74.30 01/05/2023   LDLDIRECT 192.0 02/13/2020   LDLCALC 232 (H) 01/05/2023   ALT 13 01/05/2023   AST 16 01/05/2023   NA 137 04/21/2023   K 4.8 04/21/2023   CL 105 04/21/2023   CREATININE 0.79 04/21/2023   BUN 25 (H) 04/21/2023   CO2 22 04/21/2023   TSH 1.62 01/05/2023  INR 1.1 01/17/2020   HGBA1C 5.6 01/05/2023    DG Chest 2 View Result Date: 04/21/2023 CLINICAL DATA:  Chest pain.  Shortness of breath. EXAM: CHEST - 2 VIEW COMPARISON:  03/17/2023. FINDINGS: Bilateral lung fields are clear. Bilateral costophrenic angles are clear. Note is made of elevated right hemidiaphragm. Normal cardio-mediastinal silhouette. No acute osseous abnormalities. Redemonstration of bilateral calcified breast implants. The soft tissues are otherwise within normal limits. IMPRESSION: *No active cardiopulmonary disease. Electronically Signed   By: Beula Brunswick M.D.   On: 04/21/2023 11:57       Assessment & Plan:  Health care maintenance Assessment & Plan: Physical today 07/06/23.  Declines mammogram and colonoscopy.    Hypercholesterolemia  Hyperglycemia  Essential hypertension     Dellar Fenton, MD

## 2023-07-06 NOTE — Patient Instructions (Signed)
 Amlodipine  5mg  = take one per day.

## 2023-07-07 LAB — HEPATIC FUNCTION PANEL
ALT: 10 IU/L (ref 0–32)
AST: 13 IU/L (ref 0–40)
Albumin: 4.5 g/dL (ref 3.7–4.7)
Alkaline Phosphatase: 64 IU/L (ref 44–121)
Bilirubin Total: 0.5 mg/dL (ref 0.0–1.2)
Bilirubin, Direct: 0.17 mg/dL (ref 0.00–0.40)
Total Protein: 6.5 g/dL (ref 6.0–8.5)

## 2023-07-07 LAB — CBC WITH DIFFERENTIAL/PLATELET
Basophils Absolute: 0 10*3/uL (ref 0.0–0.2)
Basos: 1 %
EOS (ABSOLUTE): 0.3 10*3/uL (ref 0.0–0.4)
Eos: 4 %
Hematocrit: 41.6 % (ref 34.0–46.6)
Hemoglobin: 13.1 g/dL (ref 11.1–15.9)
Immature Grans (Abs): 0 10*3/uL (ref 0.0–0.1)
Immature Granulocytes: 0 %
Lymphocytes Absolute: 1.6 10*3/uL (ref 0.7–3.1)
Lymphs: 24 %
MCH: 31.2 pg (ref 26.6–33.0)
MCHC: 31.5 g/dL (ref 31.5–35.7)
MCV: 99 fL — ABNORMAL HIGH (ref 79–97)
Monocytes Absolute: 0.5 10*3/uL (ref 0.1–0.9)
Monocytes: 7 %
Neutrophils Absolute: 4.1 10*3/uL (ref 1.4–7.0)
Neutrophils: 64 %
Platelets: 313 10*3/uL (ref 150–450)
RBC: 4.2 x10E6/uL (ref 3.77–5.28)
RDW: 13.2 % (ref 11.7–15.4)
WBC: 6.5 10*3/uL (ref 3.4–10.8)

## 2023-07-07 LAB — HEMOGLOBIN A1C
Est. average glucose Bld gHb Est-mCnc: 108 mg/dL
Hgb A1c MFr Bld: 5.4 % (ref 4.8–5.6)

## 2023-07-07 LAB — LIPID PANEL
Chol/HDL Ratio: 4 ratio (ref 0.0–4.4)
Cholesterol, Total: 282 mg/dL — ABNORMAL HIGH (ref 100–199)
HDL: 70 mg/dL (ref >39)
LDL Chol Calc (NIH): 193 mg/dL — ABNORMAL HIGH (ref 0–99)
Triglycerides: 112 mg/dL (ref 0–149)
VLDL Cholesterol Cal: 19 mg/dL (ref 5–40)

## 2023-07-07 LAB — BASIC METABOLIC PANEL WITH GFR
BUN/Creatinine Ratio: 25 (ref 12–28)
BUN: 21 mg/dL (ref 8–27)
CO2: 20 mmol/L (ref 20–29)
Calcium: 9.6 mg/dL (ref 8.7–10.3)
Chloride: 105 mmol/L (ref 96–106)
Creatinine, Ser: 0.84 mg/dL (ref 0.57–1.00)
Glucose: 97 mg/dL (ref 70–99)
Potassium: 4.9 mmol/L (ref 3.5–5.2)
Sodium: 139 mmol/L (ref 134–144)
eGFR: 67 mL/min/{1.73_m2} (ref >59)

## 2023-07-07 LAB — TSH: TSH: 1.52 u[IU]/mL (ref 0.450–4.500)

## 2023-07-09 ENCOUNTER — Encounter: Payer: Self-pay | Admitting: Internal Medicine

## 2023-07-09 DIAGNOSIS — L989 Disorder of the skin and subcutaneous tissue, unspecified: Secondary | ICD-10-CM | POA: Insufficient documentation

## 2023-07-09 NOTE — Assessment & Plan Note (Signed)
 Have discussed recommendation to start cholesterol medication.  She declines.  Low cholesterol diet and exercise.  Check lipid panel today.

## 2023-07-09 NOTE — Assessment & Plan Note (Signed)
 Skin lesion- below left breast. Request referral to dermatology.

## 2023-07-09 NOTE — Assessment & Plan Note (Signed)
 Supposed to be on amlodipine  5mg  q day.  She appears to not be taking her medication. Start amlodipine  5mg  q day. Follow pressures.  Follow metabolic panel.  Get hre back in soon to reassess.

## 2023-07-09 NOTE — Assessment & Plan Note (Signed)
 Denies any increased heart rate or palpitations.  Continues on eliquis .  Staying active. No chest pain or sob. Follow.

## 2023-07-09 NOTE — Assessment & Plan Note (Signed)
 Persistent right shoulder pain s/p injury. Pain down arm as well. Discussed further evaluation, including xray, referral to ortho. She is agreeable for xray.

## 2023-07-09 NOTE — Assessment & Plan Note (Signed)
 Thyroid  mass noted on previous scan. Have discussed further w/up and follow up. Discussed again today. She is agreeable for thyroid  ultrasound.

## 2023-07-09 NOTE — Assessment & Plan Note (Signed)
 Low-carb diet and exercise.  Follow met b and A1c.

## 2023-07-09 NOTE — Assessment & Plan Note (Signed)
 Received copy of cxr from Dr Jamal Mays.  Had CXR.  Discussed results.  No pneumonia present.  Did report - minimal bibasilar heterogeneous opacities favored to represent atelectasis.  Also peripherally calcified breast prosthesis seen bilaterally with suspected rupture of the left sided prosthesis.  Discussed further w/up for the breast finding.  She has previously declined. Agreeable today. Saw Dr Jamal Mays 4//25/25 - f/u asthma. Recommended albuterol  inhaler with spacer. Breathing stable. Continue f/u with pulmonary.

## 2023-07-10 ENCOUNTER — Telehealth: Payer: Self-pay

## 2023-07-10 NOTE — Telephone Encounter (Signed)
 Copied from CRM [id number]. Topic: Clinical - Lab/Test Results >> Jul 10, 2023 11:28 AM Bambi Bonine D wrote: Reason for CRM: Pt was made aware of recent lab results. Pt stated that she will think about starting the cholesterol medication.

## 2023-07-15 ENCOUNTER — Ambulatory Visit
Admission: RE | Admit: 2023-07-15 | Discharge: 2023-07-15 | Disposition: A | Discharge status: Home / Self Care | Source: Ambulatory Visit | Attending: Internal Medicine | Admitting: Internal Medicine

## 2023-07-15 ENCOUNTER — Other Ambulatory Visit: Payer: Self-pay | Admitting: Internal Medicine

## 2023-07-15 DIAGNOSIS — I161 Hypertensive emergency: Secondary | ICD-10-CM | POA: Diagnosis not present

## 2023-07-15 DIAGNOSIS — E079 Disorder of thyroid, unspecified: Secondary | ICD-10-CM

## 2023-07-15 DIAGNOSIS — E041 Nontoxic single thyroid nodule: Secondary | ICD-10-CM | POA: Diagnosis not present

## 2023-07-15 DIAGNOSIS — J9601 Acute respiratory failure with hypoxia: Secondary | ICD-10-CM | POA: Diagnosis not present

## 2023-07-15 DIAGNOSIS — R9389 Abnormal findings on diagnostic imaging of other specified body structures: Secondary | ICD-10-CM

## 2023-07-16 ENCOUNTER — Inpatient Hospital Stay
Admission: EM | Admit: 2023-07-16 | Discharge: 2023-07-17 | DRG: 304 | Disposition: A | Discharge status: Home / Self Care | Attending: Student in an Organized Health Care Education/Training Program | Admitting: Student in an Organized Health Care Education/Training Program

## 2023-07-16 ENCOUNTER — Emergency Department

## 2023-07-16 ENCOUNTER — Inpatient Hospital Stay

## 2023-07-16 ENCOUNTER — Encounter: Payer: Self-pay | Admitting: Internal Medicine

## 2023-07-16 DIAGNOSIS — I10 Essential (primary) hypertension: Secondary | ICD-10-CM | POA: Diagnosis not present

## 2023-07-16 DIAGNOSIS — Z885 Allergy status to narcotic agent status: Secondary | ICD-10-CM | POA: Diagnosis not present

## 2023-07-16 DIAGNOSIS — I482 Chronic atrial fibrillation, unspecified: Secondary | ICD-10-CM | POA: Diagnosis not present

## 2023-07-16 DIAGNOSIS — I5021 Acute systolic (congestive) heart failure: Secondary | ICD-10-CM | POA: Diagnosis not present

## 2023-07-16 DIAGNOSIS — I5043 Acute on chronic combined systolic (congestive) and diastolic (congestive) heart failure: Secondary | ICD-10-CM | POA: Diagnosis not present

## 2023-07-16 DIAGNOSIS — I2489 Other forms of acute ischemic heart disease: Secondary | ICD-10-CM | POA: Diagnosis present

## 2023-07-16 DIAGNOSIS — R112 Nausea with vomiting, unspecified: Secondary | ICD-10-CM | POA: Diagnosis present

## 2023-07-16 DIAGNOSIS — Z85828 Personal history of other malignant neoplasm of skin: Secondary | ICD-10-CM | POA: Diagnosis not present

## 2023-07-16 DIAGNOSIS — Z8249 Family history of ischemic heart disease and other diseases of the circulatory system: Secondary | ICD-10-CM

## 2023-07-16 DIAGNOSIS — I110 Hypertensive heart disease with heart failure: Secondary | ICD-10-CM | POA: Diagnosis present

## 2023-07-16 DIAGNOSIS — R680 Hypothermia, not associated with low environmental temperature: Secondary | ICD-10-CM | POA: Diagnosis present

## 2023-07-16 DIAGNOSIS — I161 Hypertensive emergency: Principal | ICD-10-CM | POA: Diagnosis present

## 2023-07-16 DIAGNOSIS — Z7901 Long term (current) use of anticoagulants: Secondary | ICD-10-CM

## 2023-07-16 DIAGNOSIS — Z880 Allergy status to penicillin: Secondary | ICD-10-CM | POA: Diagnosis not present

## 2023-07-16 DIAGNOSIS — Z90711 Acquired absence of uterus with remaining cervical stump: Secondary | ICD-10-CM

## 2023-07-16 DIAGNOSIS — R4189 Other symptoms and signs involving cognitive functions and awareness: Secondary | ICD-10-CM | POA: Diagnosis not present

## 2023-07-16 DIAGNOSIS — R0902 Hypoxemia: Secondary | ICD-10-CM | POA: Diagnosis present

## 2023-07-16 DIAGNOSIS — I5A Non-ischemic myocardial injury (non-traumatic): Secondary | ICD-10-CM | POA: Diagnosis present

## 2023-07-16 DIAGNOSIS — R0603 Acute respiratory distress: Secondary | ICD-10-CM | POA: Diagnosis not present

## 2023-07-16 DIAGNOSIS — J9601 Acute respiratory failure with hypoxia: Secondary | ICD-10-CM | POA: Diagnosis not present

## 2023-07-16 DIAGNOSIS — Z833 Family history of diabetes mellitus: Secondary | ICD-10-CM | POA: Diagnosis not present

## 2023-07-16 DIAGNOSIS — E875 Hyperkalemia: Secondary | ICD-10-CM | POA: Diagnosis not present

## 2023-07-16 DIAGNOSIS — E785 Hyperlipidemia, unspecified: Secondary | ICD-10-CM | POA: Diagnosis not present

## 2023-07-16 DIAGNOSIS — J45901 Unspecified asthma with (acute) exacerbation: Secondary | ICD-10-CM | POA: Diagnosis not present

## 2023-07-16 DIAGNOSIS — J4551 Severe persistent asthma with (acute) exacerbation: Secondary | ICD-10-CM

## 2023-07-16 DIAGNOSIS — N179 Acute kidney failure, unspecified: Secondary | ICD-10-CM | POA: Diagnosis present

## 2023-07-16 DIAGNOSIS — I4892 Unspecified atrial flutter: Secondary | ICD-10-CM | POA: Diagnosis present

## 2023-07-16 DIAGNOSIS — Z79899 Other long term (current) drug therapy: Secondary | ICD-10-CM | POA: Diagnosis not present

## 2023-07-16 DIAGNOSIS — T68XXXA Hypothermia, initial encounter: Secondary | ICD-10-CM | POA: Diagnosis present

## 2023-07-16 DIAGNOSIS — Z823 Family history of stroke: Secondary | ICD-10-CM

## 2023-07-16 DIAGNOSIS — J9602 Acute respiratory failure with hypercapnia: Secondary | ICD-10-CM | POA: Diagnosis not present

## 2023-07-16 DIAGNOSIS — R069 Unspecified abnormalities of breathing: Secondary | ICD-10-CM | POA: Diagnosis not present

## 2023-07-16 DIAGNOSIS — R000 Tachycardia, unspecified: Secondary | ICD-10-CM | POA: Diagnosis not present

## 2023-07-16 DIAGNOSIS — R4182 Altered mental status, unspecified: Secondary | ICD-10-CM | POA: Diagnosis not present

## 2023-07-16 DIAGNOSIS — Z87892 Personal history of anaphylaxis: Secondary | ICD-10-CM | POA: Diagnosis not present

## 2023-07-16 DIAGNOSIS — R404 Transient alteration of awareness: Secondary | ICD-10-CM | POA: Diagnosis not present

## 2023-07-16 HISTORY — DX: Unspecified asthma, uncomplicated: J45.909

## 2023-07-16 HISTORY — DX: Chronic combined systolic (congestive) and diastolic (congestive) heart failure: I50.42

## 2023-07-16 LAB — COMPREHENSIVE METABOLIC PANEL WITH GFR
ALT: 20 U/L (ref 0–44)
AST: 38 U/L (ref 15–41)
Albumin: 3.7 g/dL (ref 3.5–5.0)
Alkaline Phosphatase: 49 U/L (ref 38–126)
Anion gap: 10 (ref 5–15)
BUN: 29 mg/dL — ABNORMAL HIGH (ref 8–23)
CO2: 19 mmol/L — ABNORMAL LOW (ref 22–32)
Calcium: 8.3 mg/dL — ABNORMAL LOW (ref 8.9–10.3)
Chloride: 108 mmol/L (ref 98–111)
Creatinine, Ser: 0.95 mg/dL (ref 0.44–1.00)
GFR, Estimated: 58 mL/min — ABNORMAL LOW (ref >60)
Glucose, Bld: 241 mg/dL — ABNORMAL HIGH (ref 70–99)
Potassium: 6.1 mmol/L — ABNORMAL HIGH (ref 3.5–5.1)
Sodium: 137 mmol/L (ref 135–145)
Total Bilirubin: 0.7 mg/dL (ref 0.0–1.2)
Total Protein: 6 g/dL — ABNORMAL LOW (ref 6.5–8.1)

## 2023-07-16 LAB — CBC WITH DIFFERENTIAL/PLATELET
Abs Immature Granulocytes: 0.1 10*3/uL — ABNORMAL HIGH (ref 0.00–0.07)
Basophils Absolute: 0 10*3/uL (ref 0.0–0.1)
Basophils Relative: 0 %
Eosinophils Absolute: 0.9 10*3/uL — ABNORMAL HIGH (ref 0.0–0.5)
Eosinophils Relative: 10 %
HCT: 39.6 % (ref 36.0–46.0)
Hemoglobin: 12.9 g/dL (ref 12.0–15.0)
Immature Granulocytes: 1 %
Lymphocytes Relative: 39 %
Lymphs Abs: 3.4 10*3/uL (ref 0.7–4.0)
MCH: 33 pg (ref 26.0–34.0)
MCHC: 32.6 g/dL (ref 30.0–36.0)
MCV: 101.3 fL — ABNORMAL HIGH (ref 80.0–100.0)
Monocytes Absolute: 0.8 10*3/uL (ref 0.1–1.0)
Monocytes Relative: 9 %
Neutro Abs: 3.7 10*3/uL (ref 1.7–7.7)
Neutrophils Relative %: 41 %
Platelets: 320 10*3/uL (ref 150–400)
RBC: 3.91 MIL/uL (ref 3.87–5.11)
RDW: 14 % (ref 11.5–15.5)
WBC: 8.9 10*3/uL (ref 4.0–10.5)
nRBC: 0 % (ref 0.0–0.2)

## 2023-07-16 LAB — BLOOD GAS, ARTERIAL
Acid-base deficit: 7.1 mmol/L — ABNORMAL HIGH (ref 0.0–2.0)
Bicarbonate: 17.9 mmol/L — ABNORMAL LOW (ref 20.0–28.0)
Delivery systems: POSITIVE
Expiratory PAP: 8 cmH2O
FIO2: 40 %
Inspiratory PAP: 16 cmH2O
O2 Saturation: 100 %
Patient temperature: 37
RATE: 12 {breaths}/min
pCO2 arterial: 34 mmHg (ref 32–48)
pH, Arterial: 7.33 — ABNORMAL LOW (ref 7.35–7.45)
pO2, Arterial: 179 mmHg — ABNORMAL HIGH (ref 83–108)

## 2023-07-16 LAB — BRAIN NATRIURETIC PEPTIDE: B Natriuretic Peptide: 524.6 pg/mL — ABNORMAL HIGH (ref 0.0–100.0)

## 2023-07-16 LAB — TROPONIN I (HIGH SENSITIVITY): Troponin I (High Sensitivity): 21 ng/L — ABNORMAL HIGH (ref <18)

## 2023-07-16 LAB — MAGNESIUM: Magnesium: 2.3 mg/dL (ref 1.7–2.4)

## 2023-07-16 MED ORDER — AMLODIPINE BESYLATE 5 MG PO TABS
5.0000 mg | ORAL_TABLET | Freq: Every day | ORAL | Status: DC
  Administered 2023-07-17 (×2): 5 mg via ORAL
  Filled 2023-07-16 (×2): qty 1

## 2023-07-16 MED ORDER — DIPHENHYDRAMINE HCL 50 MG/ML IJ SOLN: 12.5000 mg | Freq: Three times a day (TID) | INTRAMUSCULAR | Status: DC | PRN

## 2023-07-16 MED ORDER — ACETAMINOPHEN 325 MG PO TABS: 650.0000 mg | ORAL_TABLET | Freq: Four times a day (QID) | ORAL | Status: DC | PRN

## 2023-07-16 MED ORDER — METOPROLOL TARTRATE 5 MG/5ML IV SOLN: 2.5000 mg | INTRAVENOUS | Status: DC | PRN

## 2023-07-16 MED ORDER — IPRATROPIUM-ALBUTEROL 0.5-2.5 (3) MG/3ML IN SOLN
RESPIRATORY_TRACT | Status: AC
  Filled 2023-07-16: qty 6

## 2023-07-16 MED ORDER — IPRATROPIUM-ALBUTEROL 0.5-2.5 (3) MG/3ML IN SOLN
3.0000 mL | RESPIRATORY_TRACT | Status: DC
  Administered 2023-07-17 (×2): 3 mL via RESPIRATORY_TRACT
  Filled 2023-07-16 (×2): qty 3

## 2023-07-16 MED ORDER — CALCIUM GLUCONATE-NACL 1-0.675 GM/50ML-% IV SOLN
1.0000 g | Freq: Once | INTRAVENOUS | Status: AC
  Administered 2023-07-17: 1000 mg via INTRAVENOUS
  Filled 2023-07-16: qty 50

## 2023-07-16 MED ORDER — METHYLPREDNISOLONE SODIUM SUCC 125 MG IJ SOLR
125.0000 mg | Freq: Once | INTRAMUSCULAR | Status: AC
  Administered 2023-07-16: 125 mg via INTRAVENOUS
  Filled 2023-07-16: qty 2

## 2023-07-16 MED ORDER — METHYLPREDNISOLONE SODIUM SUCC 125 MG IJ SOLR
INTRAMUSCULAR | Status: AC
  Filled 2023-07-16: qty 2

## 2023-07-16 MED ORDER — ALBUTEROL SULFATE HFA 108 (90 BASE) MCG/ACT IN AERS: 2.0000 | INHALATION_SPRAY | RESPIRATORY_TRACT | Status: DC | PRN

## 2023-07-16 MED ORDER — DILTIAZEM HCL-DEXTROSE 125-5 MG/125ML-% IV SOLN (PREMIX): 5.0000 mg/h | INTRAVENOUS | Status: DC

## 2023-07-16 MED ORDER — DM-GUAIFENESIN ER 30-600 MG PO TB12: 1.0000 | ORAL_TABLET | Freq: Two times a day (BID) | ORAL | Status: DC | PRN

## 2023-07-16 MED ORDER — DEXTROSE 50 % IV SOLN
50.0000 mL | INTRAVENOUS | Status: DC | PRN
  Filled 2023-07-16: qty 50

## 2023-07-16 MED ORDER — SODIUM ZIRCONIUM CYCLOSILICATE 10 G PO PACK
10.0000 g | PACK | Freq: Once | ORAL | Status: AC
  Administered 2023-07-17: 10 g via ORAL
  Filled 2023-07-16: qty 1

## 2023-07-16 MED ORDER — METHYLPREDNISOLONE SODIUM SUCC 125 MG IJ SOLR
80.0000 mg | Freq: Every day | INTRAMUSCULAR | Status: DC
  Administered 2023-07-17: 80 mg via INTRAVENOUS
  Filled 2023-07-16: qty 2

## 2023-07-16 MED ORDER — NITROGLYCERIN 2 % TD OINT
1.0000 [in_us] | TOPICAL_OINTMENT | Freq: Once | TRANSDERMAL | Status: AC
  Administered 2023-07-16: 1 [in_us] via TOPICAL
  Filled 2023-07-16: qty 1

## 2023-07-16 MED ORDER — IPRATROPIUM-ALBUTEROL 0.5-2.5 (3) MG/3ML IN SOLN
6.0000 mL | Freq: Once | RESPIRATORY_TRACT | Status: AC
  Administered 2023-07-16: 6 mL via RESPIRATORY_TRACT
  Filled 2023-07-16: qty 6

## 2023-07-16 MED ORDER — INSULIN ASPART 100 UNIT/ML IV SOLN
10.0000 [IU] | Freq: Once | INTRAVENOUS | Status: AC
  Administered 2023-07-17: 10 [IU] via INTRAVENOUS
  Filled 2023-07-16: qty 0.1

## 2023-07-16 MED ORDER — HYDRALAZINE HCL 20 MG/ML IJ SOLN: 10.0000 mg | INTRAMUSCULAR | Status: DC | PRN

## 2023-07-16 MED ORDER — FUROSEMIDE 10 MG/ML IJ SOLN
40.0000 mg | Freq: Two times a day (BID) | INTRAMUSCULAR | Status: DC
  Administered 2023-07-17: 40 mg via INTRAVENOUS
  Filled 2023-07-16: qty 4

## 2023-07-16 MED ORDER — FUROSEMIDE 10 MG/ML IJ SOLN
60.0000 mg | Freq: Once | INTRAMUSCULAR | Status: AC
  Administered 2023-07-17: 60 mg via INTRAVENOUS
  Filled 2023-07-16: qty 8

## 2023-07-16 MED ORDER — ALBUTEROL SULFATE (2.5 MG/3ML) 0.083% IN NEBU: 2.5000 mg | INHALATION_SOLUTION | RESPIRATORY_TRACT | Status: DC | PRN

## 2023-07-16 NOTE — ED Provider Notes (Signed)
 [facility] Provider Note    Event Date/Time   First MD Initiated Contact with Patient 07/16/23 2130     (approximate)   History   Respiratory Distress   HPI  Kathleen Wallace is a 89 y.o. female who presents to the ED for evaluation of Respiratory Distress   Review of routine PCP visit from 10 days ago.  History of A-fib, HTN, asthma.  Anticoagulated on Eliquis .  On echo was from 2021 with grade 1 diastolic dysfunction.  Patient presents to the ED via EMS from a local fire station due to shortness of breath and poor responsiveness.  Majority of history is provided by patient's husband who arrives to the bedside shortly after she does.  She was short of breath at home tonight and requested going to the fire station so husband drove her there.  They barely made it to the fire station, she was poorly responsive.  Apparently fire thought that she was pulseless and did 1 round of CPR, no epinephrine  or shocking or other medications provided.  Reportedly hypoxic, 60s or 70s on room air, she was placed on nonrebreather and transferred by EMS to us .  On arrival to the ED she is minimally responsive, repetitive asking for help and saying that she cannot breathe.  Tripoding.  History is limited on initial presentation   Physical Exam   Triage Vital Signs: ED Triage Vitals  Encounter Vitals Group     BP      Girls Systolic BP Percentile      Girls Diastolic BP Percentile      Boys Systolic BP Percentile      Boys Diastolic BP Percentile      Pulse      Resp      Temp      Temp src      SpO2      Weight      Height      Head Circumference      Peak Flow      Pain Score      Pain Loc      Pain Education      Exclude from Growth Chart     Most recent vital signs: Vitals:   07/16/23 2130 07/16/23 2300  BP: (!) 173/113 124/74  Pulse: (!) 132 88  Resp: (!) 21 (!) 22  Temp: (!) 97.1 F (36.2 C)   SpO2: 100% 100%    General: Tripoding, disoriented,  looking unwell, repetitive.  Moving all 4 extremities without apparent deficit.  No signs of trauma CV:  Good peripheral perfusion.  Tachycardic Resp:  Tachypneic to the 30s, wheezing throughout with decreased airflow. Abd:  No distention.  MSK:  No deformity noted.  Neuro:  No focal deficits appreciated. Other:     ED Results / Procedures / Treatments   Labs (all labs ordered are listed, but only abnormal results are displayed) Labs Reviewed  COMPREHENSIVE METABOLIC PANEL WITH GFR - Abnormal; Notable for the following components:      Result Value   Potassium 6.1 (*)    CO2 19 (*)    Glucose, Bld 241 (*)    BUN 29 (*)    Calcium 8.3 (*)    Total Protein 6.0 (*)    GFR, Estimated 58 (*)    All other components within normal limits  BRAIN NATRIURETIC PEPTIDE - Abnormal; Notable for the following components:   B Natriuretic Peptide 524.6 (*)    All other  components within normal limits  BLOOD GAS, ARTERIAL - Abnormal; Notable for the following components:   pH, Arterial 7.33 (*)    pO2, Arterial 179 (*)    Bicarbonate 17.9 (*)    Acid-base deficit 7.1 (*)    All other components within normal limits  CBC WITH DIFFERENTIAL/PLATELET - Abnormal; Notable for the following components:   MCV 101.3 (*)    Eosinophils Absolute 0.9 (*)    Abs Immature Granulocytes 0.10 (*)    All other components within normal limits  TROPONIN I (HIGH SENSITIVITY) - Abnormal; Notable for the following components:   Troponin I (High Sensitivity) 21 (*)    All other components within normal limits  MAGNESIUM   CBC WITH DIFFERENTIAL/PLATELET  URINALYSIS, ROUTINE W REFLEX MICROSCOPIC  TROPONIN I (HIGH SENSITIVITY)    EKG Atrial flutter with a 2-1 block and a rate of 130 bpm, left bundle without STEMI by Sgarbossa criteria.  RADIOLOGY CXR interpreted by me without evidence of acute cardiopulmonary pathology.  Official radiology report(s): DG Chest Portable 1 View Result Date: 07/16/2023 CLINICAL  DATA:  Respiratory distress EXAM: PORTABLE CHEST 1 VIEW COMPARISON:  Chest x-ray 04/21/2023 FINDINGS: There is no focal lung infiltrate, pleural effusion or pneumothorax. The cardiomediastinal silhouette is within normal limits. Peripherally calcified breast implants are present bilaterally. No acute fractures are seen. IMPRESSION: No active disease. Electronically Signed   By: Greig Pique M.D.   On: 07/16/2023 23:00    PROCEDURES and INTERVENTIONS:  .Critical Care  Performed by: Claudene Rover, MD Authorized by: Claudene Rover, MD   Critical care provider statement:    Critical care time (minutes):  30   Critical care time was exclusive of:  Separately billable procedures and treating other patients   Critical care was necessary to treat or prevent imminent or life-threatening deterioration of the following conditions:  Respiratory failure   Critical care was time spent personally by me on the following activities:  Development of treatment plan with patient or surrogate, discussions with consultants, evaluation of patient's response to treatment, examination of patient, ordering and review of laboratory studies, ordering and review of radiographic studies, ordering and performing treatments and interventions, pulse oximetry, re-evaluation of patient's condition and review of old charts .1-3 Lead EKG Interpretation  Performed by: Claudene Rover, MD Authorized by: Claudene Rover, MD     Interpretation: abnormal     ECG rate:  130   ECG rate assessment: tachycardic     Rhythm: atrial flutter     Ectopy: none     Conduction: normal     Medications  furosemide  (LASIX ) injection 60 mg (has no administration in time range)  furosemide  (LASIX ) injection 40 mg (has no administration in time range)  calcium gluconate 1 g/ 50 mL sodium chloride  IVPB (has no administration in time range)  dextrose 50 % solution 50 mL (has no administration in time range)  insulin aspart (novoLOG) injection 10 Units  (has no administration in time range)  sodium zirconium cyclosilicate (LOKELMA) packet 10 g (has no administration in time range)  methylPREDNISolone  sodium succinate (SOLU-MEDROL ) 125 mg/2 mL injection 80 mg (has no administration in time range)  hydrALAZINE (APRESOLINE) injection 10 mg (has no administration in time range)  acetaminophen  (TYLENOL ) tablet 650 mg (has no administration in time range)  diphenhydrAMINE (BENADRYL) injection 12.5 mg (has no administration in time range)  ipratropium-albuterol  (DUONEB) 0.5-2.5 (3) MG/3ML nebulizer solution 3 mL (has no administration in time range)  albuterol  (VENTOLIN  HFA) 108 (90 Base) MCG/ACT  inhaler 2 puff (has no administration in time range)  dextromethorphan -guaiFENesin  (MUCINEX  DM) 30-600 MG per 12 hr tablet 1 tablet (has no administration in time range)  diltiazem (CARDIZEM) 125 mg in dextrose 5% 125 mL (1 mg/mL) infusion (has no administration in time range)  nitroGLYCERIN (NITROGLYN) 2 % ointment 1 inch (1 inch Topical Given 07/16/23 2138)  methylPREDNISolone  sodium succinate (SOLU-MEDROL ) 125 mg/2 mL injection 125 mg (125 mg Intravenous Given 07/16/23 2149)  ipratropium-albuterol  (DUONEB) 0.5-2.5 (3) MG/3ML nebulizer solution 6 mL (6 mLs Nebulization Given 07/16/23 2152)     IMPRESSION / MDM / ASSESSMENT AND PLAN / ED COURSE  I reviewed the triage vital signs and the nursing notes.  Differential diagnosis includes, but is not limited to, ACS, PTX, PNA, muscle strain/spasm, PE, dissection, anxiety, pleural effusion  {Patient presents with symptoms of an acute illness or injury that is potentially life-threatening.  Patient presents to the ED with respiratory failure, presents tripoding in extremis, but improving dramatically quickly with nitroglycerin paste and BiPAP administration.  Subsequently slowing her respiratory rate, returning to baseline mentation.  Has a clear CXR but significant wheezing and elevated BNP with evidence of  volume overload and COPD exacerbation.  Received breathing treatments and steroids through BiPAP.  ABG with mild acidosis.  Hyperkalemia is noted and protocols are placed.  Consult medicine for admission.  Minimally elevated troponin.  Clinical Course as of 07/16/23 2312  Thu Jul 16, 2023  2200 Reassessed.  Doing much better on the BiPAP.  Respiration rate is slowed, heart rate is slowed, pulling good volumes.  Her husband arrives at the bedside to provide supplemental history [DS]  2230 Reassessed, looks well, no needs.  Continues to pull good volumes in the BiPAP, slow respiratory rates [DS]  2306 Consult hospitalist agrees to admit [DS]    Clinical Course User Index [DS] Claudene Rover, MD     FINAL CLINICAL IMPRESSION(S) / ED DIAGNOSES   Final diagnoses:  Acute respiratory failure with hypoxia (HCC)  Hyperkalemia     Rx / DC Orders   ED Discharge Orders     None        Note:  This document was prepared using Dragon voice recognition software and may include unintentional dictation errors.   Claudene Rover, MD 07/16/23 [phone]

## 2023-07-16 NOTE — ED Triage Notes (Signed)
 BIB EMS: Husband drove Pt to fire dept.  She was unresponsive. Did one round of CPR - no shocks.  Pt is moaning and stating she can't breathe. Went with Non rebreather - 100%  BP 213/123   On blood thinners Husband said she had bronchitis

## 2023-07-16 NOTE — H&P (Signed)
 History and Physical    Kathleen Wallace FMW:[id number] DOB: [birth year] DOA: 07/16/2023  Referring MD/NP/PA:   PCP: Glendia Shad, MD   Patient coming from:  The patient is coming from home.     Chief Complaint: SOB and unresponsiveness.  HPI: Kathleen Wallace is a 89 y.o. female with medical history significant of CHF with EF 45%, HTN, HLD, asthma with bronchitis, GERD, A-fib on Eliquis , left bundle blockade, skin cancer, LBBB, who presents with SOB and unresponsiveness.  Both her husband and the daughter at bedside, patient has chronic SOB which has significantly worsened today.  She has cough with little mucus production, no chest pain.  No fever or chills. Husband drove Pt to fire dept. Pt became unresponsive in the car. They did one round of CPR, no shocks or Epi was given.  Her blood pressure was elevated at 213/123 per EMS report.  Patient was found to have severe respiratory distress, gasping for air, nonrebreather was started, then changed to BiPAP by ED.  Patient was treated with nebulizer, 125 mg of Solu-Medrol  and started on 15 mg of nitroglycerin  patch in ED. her blood pressure improved with SBP 120-130s.  Her respiratory distress has significantly improved when I saw pt in ED. She is alert oriented x 3 when I saw patient in ED.  She moves all extremities normally.  No facial droop or slurred speech.  Per her daughter, patient did not have nausea and vomiting at home, but vomited once when she was on BiPAP.  I discontinued the BiPAP.  She is doing okay, no respiratory distress anymore.  RR is in 20s, with oxygen saturation 100% on room air after discontinuation of BiPAP.  Patient does not have abdominal pain or diarrhea.  Denies symptoms of UTI.  Patient was found to have A flutter with RVR, heart rate was in 130s initially, which has improved to 80s along with improvement of respiratory distress.  Data reviewed independently and ED Course: pt was found to have BNP 524, troponin 21,  potassium 6.1, magnesium  2.3, creatinine 0.95, BUN 29, GFR 58, temperature 97.1.  ABG with pH 7.33, CO2 34, O2 179.  Negative chest x-ray.  Patient is admitted to PCU as inpatient.   EKG: I have personally reviewed.  A flutter with RVR, QTc 581, old left bundle blockade, anteroseptal infarction pattern, borderline LAD.   Review of Systems:   General: no fevers, chills, no body weight gain, has fatigue HEENT: no blurry vision, hearing changes or sore throat Respiratory: has dyspnea, coughing, wheezing CV: no chest pain, no palpitations GI: has nausea, vomiting, no abdominal pain, diarrhea, constipation GU: no dysuria, burning on urination, increased urinary frequency, hematuria  Ext: has trace leg edema Neuro: no unilateral weakness, numbness, or tingling, no vision change or hearing loss Skin: no rash, no skin tear. MSK: No muscle spasm, no deformity, no limitation of range of movement in spin Heme: No easy bruising.  Travel history: No recent long distant travel.   Allergy:  Allergies  Allergen Reactions   Other Anaphylaxis   Codeine Nausea Only and Other (See Comments)    tachycardia   Amoxicillin     insomnia Did it involve swelling of the face/tongue/throat, SOB, or low BP? No Did it involve sudden or severe rash/hives, skin peeling, or any reaction on the inside of your mouth or nose? No Did you need to seek medical attention at a hospital or doctor's office? No When did it last happen?  years ago If all above answers are NO, may proceed with cephalosporin use.     Past Medical History:  Diagnosis Date   Asthma    Cancer (HCC)    skin cancer nose   Chronic combined systolic and diastolic CHF (congestive heart failure) (HCC)    Complication of anesthesia    woke up during partial hysterectomy   Elevated blood pressure    GERD (gastroesophageal reflux disease)    occ-no meds   Headache    h/o    Heart murmur    asymptomatic    Past Surgical History:   Procedure Laterality Date   ABDOMINAL HYSTERECTOMY  1974   partial   BREAST SURGERY     IMPLANTS   CATARACT EXTRACTION W/PHACO Right 02/16/2018   Procedure: CATARACT EXTRACTION PHACO AND INTRAOCULAR LENS PLACEMENT (IOC) RIGHT;  Surgeon: Ferol Rogue, MD;  Location: [facility] ORS;  Service: Ophthalmology;  Laterality: Right;  US   01:10 CDE 11.02 Fluid pack lot # [id number]   CATARACT EXTRACTION W/PHACO Left 03/18/2018   Procedure: CATARACT EXTRACTION PHACO AND INTRAOCULAR LENS PLACEMENT (IOC) LEFT;  Surgeon: Ferol Rogue, MD;  Location: [facility];  Service: Ophthalmology;  Laterality: Left;  US  01:06.5 CDE 10.80 FLUID PACK LOT # [id number]   LUMBAR LAMINECTOMY/ DECOMPRESSION WITH MET-RX Bilateral 09/20/2018   Procedure: LUMBAR LAMINECTOMY/ DECOMPRESSION WITH MET-RX;  Surgeon: Bluford Standing, MD;  Location: [facility] OR[facility];  Service: Neurosurgery;  Laterality: Bilateral;   TONSILLECTOMY AND ADENOIDECTOMY  1944    Social History:  reports that she has never smoked. She has never used smokeless tobacco. She reports that she does not drink alcohol and does not use drugs.  Family History:  Family History  Problem Relation Age of Onset   Stroke Mother    Hypertension Mother    Stroke Father    Hypertension Father    Diabetes Other      Prior to Admission medications   Medication Sig Start Date End Date Taking? Authorizing Provider  albuterol  (VENTOLIN  HFA) 108 (90 Base) MCG/ACT inhaler Inhale 2 puffs into the lungs every 6 (six) hours as needed for wheezing or shortness of breath. 03/17/23   Malvina Alm DASEN, MD  amLODipine  (NORVASC ) 5 MG tablet Take 1 tablet (5 mg total) by mouth daily. 01/05/23   Glendia Shad, MD  apixaban  (ELIQUIS ) 2.5 MG TABS tablet Take 1 tablet (2.5 mg total) by mouth 2 (two) times daily. 01/05/23   Glendia Shad, MD  Coenzyme Q10 (COQ10) 100 MG CAPS Take 100 mg by mouth daily.     [provider]  dorzolamidel-timolol  (COSOPT ) 22.3-6.8 MG/ML SOLN ophthalmic solution  Place 1 drop into both eyes 2 (two) times daily.    [provider]  latanoprost  (XALATAN ) 0.005 % ophthalmic solution Place 1 drop into both eyes at bedtime.    [provider]  Multiple Vitamins-Minerals (MULTIVITAMIN WITH MINERALS) tablet Take 1 tablet by mouth daily.     [provider]  ROCKLATAN 0.02-0.005 % SOLN SMARTSIG:1 Drop(s) In Eye(s) Every Evening 05/24/19   [provider]  vitamin C (ASCORBIC ACID ) 500 MG tablet Take 500 mg by mouth daily.     [provider]    Physical Exam: Vitals:   07/16/23 2130 07/16/23 2300  BP: (!) 173/113 124/74  Pulse: (!) 132 88  Resp: (!) 21 (!) 22  Temp: (!) 97.1 F (36.2 C)   TempSrc: Tympanic   SpO2: 100% 100%   General: Has acute respiratory distress which has resolved  HEENT:  Eyes: PERRL, EOMI, no jaundice       ENT: No discharge from the ears and nose, no pharynx injection, no tonsillar enlargement.        Neck: positive JVD, no mass felt. Heme: No neck lymph node enlargement. Cardiac: S1/S2, RRR, No gallops or rubs. Respiratory: has fine crackles, rhonchi and wheezing bilaterally GI: Soft, nondistended, nontender, no rebound pain, no organomegaly, BS present. GU: No hematuria Ext: has trace pitting leg edema bilaterally. 1+DP/PT pulse bilaterally. Musculoskeletal: No joint deformities, No joint redness or warmth, no limitation of ROM in spin. Skin: No rashes.  Neuro: Alert, oriented X3, cranial nerves II-XII grossly intact, moves all extremities normally. Psych: Patient is not psychotic, no suicidal or hemocidal ideation.  Labs on Admission: I have personally reviewed following labs and imaging studies  CBC: Recent Labs  Lab 07/16/23 2137  WBC 8.9  NEUTROABS 3.7  HGB 12.9  HCT 39.6  MCV 101.3*  PLT 320   Basic Metabolic Panel: Recent Labs  Lab 07/16/23 2136  NA 137  K 6.1*  CL 108  CO2 19*  GLUCOSE 241*  BUN 29*  CREATININE 0.95  CALCIUM  8.3*  MG 2.3    GFR: Estimated Creatinine Clearance: 31.5 mL/min (by C-G formula based on SCr of 0.95 mg/dL). Liver Function Tests: Recent Labs  Lab 07/16/23 2136  AST 38  ALT 20  ALKPHOS 49  BILITOT 0.7  PROT 6.0*  ALBUMIN 3.7   No results for input(s): LIPASE, AMYLASE in the last 168 hours. No results for input(s): AMMONIA in the last 168 hours. Coagulation Profile: No results for input(s): INR, PROTIME in the last 168 hours. Cardiac Enzymes: No results for input(s): CKTOTAL, CKMB, CKMBINDEX, TROPONINI in the last 168 hours. BNP (last 3 results) No results for input(s): PROBNP in the last 8760 hours. HbA1C: No results for input(s): HGBA1C in the last 72 hours. CBG: No results for input(s): GLUCAP in the last 168 hours. Lipid Profile: No results for input(s): CHOL, HDL, LDLCALC, TRIG, CHOLHDL, LDLDIRECT in the last 72 hours. Thyroid  Function Tests: No results for input(s): TSH, T4TOTAL, FREET4, T3FREE, THYROIDAB in the last 72 hours. Anemia Panel: No results for input(s): VITAMINB12, FOLATE, FERRITIN, TIBC, IRON, RETICCTPCT in the last 72 hours. Urine analysis:    Component Value Date/Time   COLORURINE AMBER (A) 01/17/2020 1827   APPEARANCEUR HAZY (A) 01/17/2020 1827   LABSPEC 1.026 01/17/2020 1827   PHURINE 5.0 01/17/2020 1827   GLUCOSEU NEGATIVE 01/17/2020 1827   GLUCOSEU NEGATIVE 04/18/2014 1324   HGBUR NEGATIVE 01/17/2020 1827   BILIRUBINUR NEGATIVE 01/17/2020 1827   KETONESUR 20 (A) 01/17/2020 1827   PROTEINUR 30 (A) 01/17/2020 1827   UROBILINOGEN 0.2 04/18/2014 1324   NITRITE NEGATIVE 01/17/2020 1827   LEUKOCYTESUR NEGATIVE 01/17/2020 1827   Sepsis Labs: @LABRCNTIP (procalcitonin:4,lacticidven:4) )No results found for this or any previous visit (from the past 240 hours).   Radiological Exams on Admission:   Assessment/Plan Principal Problem:   Acute respiratory failure with hypoxia (HCC) Active Problems:    Acute on chronic combined systolic and diastolic congestive heart failure (HCC)   Asthma exacerbation   Essential hypertension   Hypertensive emergency   Atrial flutter with rapid ventricular response (HCC)   Atrial fibrillation, chronic (HCC)   Unresponsiveness   Myocardial injury   Hyperkalemia   AKI (acute kidney injury) (HCC)   Hypothermia   Nausea & vomiting   Assessment and Plan:  Acute respiratory failure with hypoxia [facility]): Patient had severe respiratory distress, gasping for  air initially, required BiPAP.  Likely due to multifactorial etiology, including hypertensive emergency, possible flash pulm edema, CHF exacerbation, asthma exacerbation and A flutter with RVR.  After treatment with 125 mg for Solu-Medrol , nitroglycerin  patch, nebulizer in ED, her respiratory distress has resolved.  Currently is off BiPAP.  -Admitted to PCU as inpatient - Bronchodilators, as needed Mucinex  - Nasal cannula oxygen to maintain oxygen saturation above 93% on room air. - Treat underlying issues as below - check RESP viral panel  Acute on chronic combined systolic and diastolic congestive heart failure [facility]): Patient has trace leg edema, positive JVD, crackles on auscultation, elevated BNP 524, clinically consistent with CHF exacerbation.  Patient may to have had flash pulm edema due to hypertensive emergency. -Lasix  60 mg x 1 dose , then 40 mg bid by IV tomorrow -2d echo -Daily weights -strict I/O's -Low salt diet -Fluid restriction -As needed bronchodilators for shortness of breath  Asthma exacerbation -Bronchodilators and prn Mucinex  -Solu-Medrol  80 mg IV daily after given 125 mg of Solu-Medrol  -Incentive spirometry  Essential hypertension and hypertensive emergency: initial Bp 213/123, which has improved to SBP 120-130 after started nitroglycerin  patch 15 mg.  Patient states that she is not taking her amlodipine  5 mg daily now - Restart amlodipine  5 mg daily - IV hydralazine  as  needed  Atrial flutter with rapid ventricular response and hx of atrial fibrillation, chronic: Heart rate has improved to 130 to 80s - As needed metoprolol  IV 2.5 mg for heart rate> 125 - continue Eliquis  - f/u 2d echo. If EF is not too low, may consider to start metoprolol  or Cardizem   Unresponsiveness: Possibly due to severe respiratory distress.  Patient is currently oriented x 3.  No focal neurodeficit on physical examination. - Frequent neurocheck - Fall precaution - Follow-up CT scan of head  Myocardial injury: Troponin 21, no chest pain, likely demand ischemia. -Trend troponin - Check FLP - Patient had A1c 5.4 on 07/06/2023 - Will not give aspirin since patient is on Eliquis  - Follow-up 2D echo  Hyperkalemia: Potassium 6.1. - Treated with 1 g calcium  gluconate, D50, NovoLog  10 unit - Give 10 g Lokelma   AKI: mild.  Her baseline GFR> 60.  Today GFR 58, creatinine 0.95, BUN 29. - Follow-up with BMP - Avoid using renal toxic medications.  Hypothermia: Body temperature 97.1 -Bair hugger  Nausea and vomiting: Etiology is not clear.  Patient did not have nausea vomiting, but developed nausea vomiting when she was on BiPAP.  Denies abdominal pain or diarrhea.  No fever or chills.  Liver function okay. -Check lipase - As needed Benadryl        DVT ppx: on Eliquis   Code Status: Full code per pt, and her husband and her daughter  Family Communication: Yes, patient's husband and daughter   at bed side.      Disposition Plan:  Anticipate discharge back to previous environment  Consults called:  none  Admission status and Level of care: Progressive: as inpt        Dispo: The patient is from: Home              Anticipated d/c is to: Home              Anticipated d/c date is: 2 days              Patient currently is not medically stable to d/c.    Severity of Illness:  The appropriate patient status for this patient is INPATIENT. Inpatient  status is judged to be  reasonable and necessary in order to provide the required intensity of service to ensure the patient's safety. The patient's presenting symptoms, physical exam findings, and initial radiographic and laboratory data in the context of their chronic comorbidities is felt to place them at high risk for further clinical deterioration. Furthermore, it is not anticipated that the patient will be medically stable for discharge from the hospital within 2 midnights of admission.   * I certify that at the point of admission it is my clinical judgment that the patient will require inpatient hospital care spanning beyond 2 midnights from the point of admission due to high intensity of service, high risk for further deterioration and high frequency of surveillance required.*       Date of Service 07/17/2023    Caleb Exon [facility]   If 7PM-7AM, please contact night-coverage [email or web address] 07/17/2023, 12:18 AM

## 2023-07-16 NOTE — H&P (Incomplete)
 History and Physical    Kathleen Wallace FMW:[id number] DOB: [birth year] DOA: 07/16/2023  Referring MD/NP/PA:   PCP: Glendia Shad, MD   Patient coming from:  The patient is coming from home.     Chief Complaint: SOB and unresponsiveness.  HPI: Kathleen Wallace is a 89 y.o. female with medical history significant of CHF with EF 45%, HTN, HLD, asthma with bronchitis, GERD, A-fib on Eliquis , left bundle blockade, skin cancer, LBBB, who presents with SOB and unresponsiveness.  Both her husband and the daughter at bedside, patient has chronic SOB which has significantly worsened today.  She has cough with little mucus production, no chest pain.  No fever or chills. Husband drove Pt to fire dept. Pt became unresponsive in the car. They did one round of CPR, no shocks or Epi was given.  Her blood pressure was elevated at 213/123 per EMS report.  Patient was found to have severe respiratory distress, gasping for air, nonrebreather was started, then changed to BiPAP by ED.  Patient was treated with nebulizer, 125 mg of Solu-Medrol  and started on 15 mg of nitroglycerin patch in ED. her blood pressure improved with SBP 120-130s.  Her respiratory distress has significantly improved when I saw pt in ED. She is alert oriented x 3 when I saw patient in ED.  She moves all extremities normally.  No facial droop or slurred speech.  Per her daughter, patient did not have nausea and vomiting at home, but vomited once when she was on BiPAP.  I discontinued the BiPAP.  She is doing okay, no respiratory distress anymore.  RR is in 20s, with oxygen saturation 100% on room air after discontinuation of BiPAP.  Patient does not have abdominal pain or diarrhea.  Denies symptoms of UTI.  Patient was found to have A flutter with RVR, heart rate was in 130s initially, which has improved to 80s along with improvement of respiratory distress.  Data reviewed independently and ED Course: pt was found to have BNP 524, troponin 21,  potassium 6.1, magnesium  2.3, creatinine 0.95, BUN 29, GFR 58, temperature 97.1, negative chest x-ray.  Patient is admitted to PCU as inpatient.   EKG: I have personally reviewed.  A flutter with RVR, QTc 581, old left bundle blockade, anteroseptal infarction pattern, borderline LAD.   Review of Systems:   General: no fevers, chills, no body weight gain, has fatigue HEENT: no blurry vision, hearing changes or sore throat Respiratory: has dyspnea, coughing, wheezing CV: no chest pain, no palpitations GI: has nausea, vomiting, no abdominal pain, diarrhea, constipation GU: no dysuria, burning on urination, increased urinary frequency, hematuria  Ext: has trace leg edema Neuro: no unilateral weakness, numbness, or tingling, no vision change or hearing loss Skin: no rash, no skin tear. MSK: No muscle spasm, no deformity, no limitation of range of movement in spin Heme: No easy bruising.  Travel history: No recent long distant travel.   Allergy:  Allergies  Allergen Reactions  . Other Anaphylaxis  . Codeine Nausea Only and Other (See Comments)    tachycardia  . Amoxicillin     insomnia Did it involve swelling of the face/tongue/throat, SOB, or low BP? No Did it involve sudden or severe rash/hives, skin peeling, or any reaction on the inside of your mouth or nose? No Did you need to seek medical attention at a hospital or doctor's office? No When did it last happen?      years ago If all above answers are NO, may  proceed with cephalosporin use.     Past Medical History:  Diagnosis Date  . Asthma   . Cancer (HCC)    skin cancer nose  . Chronic combined systolic and diastolic CHF (congestive heart failure) (HCC)   . Complication of anesthesia    woke up during partial hysterectomy  . Elevated blood pressure   . GERD (gastroesophageal reflux disease)    occ-no meds  . Headache    h/o   . Heart murmur    asymptomatic    Past Surgical History:  Procedure Laterality Date   . ABDOMINAL HYSTERECTOMY  1974   partial  . BREAST SURGERY     IMPLANTS  . CATARACT EXTRACTION W/PHACO Right 02/16/2018   Procedure: CATARACT EXTRACTION PHACO AND INTRAOCULAR LENS PLACEMENT (IOC) RIGHT;  Surgeon: Ferol Rogue, MD;  Location: [facility] ORS;  Service: Ophthalmology;  Laterality: Right;  US   01:10 CDE 11.02 Fluid pack lot # [id number]  . CATARACT EXTRACTION W/PHACO Left 03/18/2018   Procedure: CATARACT EXTRACTION PHACO AND INTRAOCULAR LENS PLACEMENT (IOC) LEFT;  Surgeon: Ferol Rogue, MD;  Location: [facility] OR[facility];  Service: Ophthalmology;  Laterality: Left;  US  01:06.5 CDE 10.80 FLUID PACK LOT # [id number]  . LUMBAR LAMINECTOMY/ DECOMPRESSION WITH MET-RX Bilateral 09/20/2018   Procedure: LUMBAR LAMINECTOMY/ DECOMPRESSION WITH MET-RX;  Surgeon: Bluford Standing, MD;  Location: [facility] ORS;  Service: Neurosurgery;  Laterality: Bilateral;  . TONSILLECTOMY AND ADENOIDECTOMY  1944    Social History:  reports that she has never smoked. She has never used smokeless tobacco. She reports that she does not drink alcohol and does not use drugs.  Family History:  Family History  Problem Relation Age of Onset  . Stroke Mother   . Hypertension Mother   . Stroke Father   . Hypertension Father   . Diabetes Other      Prior to Admission medications   Medication Sig Start Date End Date Taking? Authorizing Provider  albuterol  (VENTOLIN  HFA) 108 (90 Base) MCG/ACT inhaler Inhale 2 puffs into the lungs every 6 (six) hours as needed for wheezing or shortness of breath. 03/17/23   Malvina Alm DASEN, MD  amLODipine  (NORVASC ) 5 MG tablet Take 1 tablet (5 mg total) by mouth daily. 01/05/23   Glendia Shad, MD  apixaban  (ELIQUIS ) 2.5 MG TABS tablet Take 1 tablet (2.5 mg total) by mouth 2 (two) times daily. 01/05/23   Glendia Shad, MD  Coenzyme Q10 (COQ10) 100 MG CAPS Take 100 mg by mouth daily.     [provider]  dorzolamidel-timolol  (COSOPT ) 22.3-6.8 MG/ML SOLN ophthalmic solution Place 1 drop into  both eyes 2 (two) times daily.    [provider]  latanoprost  (XALATAN ) 0.005 % ophthalmic solution Place 1 drop into both eyes at bedtime.    [provider]  Multiple Vitamins-Minerals (MULTIVITAMIN WITH MINERALS) tablet Take 1 tablet by mouth daily.     [provider]  ROCKLATAN 0.02-0.005 % SOLN SMARTSIG:1 Drop(s) In Eye(s) Every Evening 05/24/19   [provider]  vitamin C (ASCORBIC ACID ) 500 MG tablet Take 500 mg by mouth daily.     [provider]    Physical Exam: Vitals:   07/16/23 2130 07/16/23 2300  BP: (!) 173/113 124/74  Pulse: (!) 132 88  Resp: (!) 21 (!) 22  Temp: (!) 97.1 F (36.2 C)   TempSrc: Tympanic   SpO2: 100% 100%   General: Has acute respiratory distress which has resolved  HEENT:       Eyes: PERRL, EOMI,  no jaundice       ENT: No discharge from the ears and nose, no pharynx injection, no tonsillar enlargement.        Neck: positive JVD, no mass felt. Heme: No neck lymph node enlargement. Cardiac: S1/S2, RRR, No gallops or rubs. Respiratory: has fine crackles, rhonchi and wheezing bilaterally GI: Soft, nondistended, nontender, no rebound pain, no organomegaly, BS present. GU: No hematuria Ext: has trace pitting leg edema bilaterally. 1+DP/PT pulse bilaterally. Musculoskeletal: No joint deformities, No joint redness or warmth, no limitation of ROM in spin. Skin: No rashes.  Neuro: Alert, oriented X3, cranial nerves II-XII grossly intact, moves all extremities normally. Psych: Patient is not psychotic, no suicidal or hemocidal ideation.  Labs on Admission: I have personally reviewed following labs and imaging studies  CBC: Recent Labs  Lab 07/16/23 2137  WBC 8.9  NEUTROABS 3.7  HGB 12.9  HCT 39.6  MCV 101.3*  PLT 320   Basic Metabolic Panel: Recent Labs  Lab 07/16/23 2136  NA 137  K 6.1*  CL 108  CO2 19*  GLUCOSE 241*  BUN 29*  CREATININE 0.95  CALCIUM 8.3*  MG 2.3   GFR: Estimated  Creatinine Clearance: 31.5 mL/min (by C-G formula based on SCr of 0.95 mg/dL). Liver Function Tests: Recent Labs  Lab 07/16/23 2136  AST 38  ALT 20  ALKPHOS 49  BILITOT 0.7  PROT 6.0*  ALBUMIN 3.7   No results for input(s): LIPASE, AMYLASE in the last 168 hours. No results for input(s): AMMONIA in the last 168 hours. Coagulation Profile: No results for input(s): INR, PROTIME in the last 168 hours. Cardiac Enzymes: No results for input(s): CKTOTAL, CKMB, CKMBINDEX, TROPONINI in the last 168 hours. BNP (last 3 results) No results for input(s): PROBNP in the last 8760 hours. HbA1C: No results for input(s): HGBA1C in the last 72 hours. CBG: No results for input(s): GLUCAP in the last 168 hours. Lipid Profile: No results for input(s): CHOL, HDL, LDLCALC, TRIG, CHOLHDL, LDLDIRECT in the last 72 hours. Thyroid  Function Tests: No results for input(s): TSH, T4TOTAL, FREET4, T3FREE, THYROIDAB in the last 72 hours. Anemia Panel: No results for input(s): VITAMINB12, FOLATE, FERRITIN, TIBC, IRON, RETICCTPCT in the last 72 hours. Urine analysis:    Component Value Date/Time   COLORURINE AMBER (A) 01/17/2020 1827   APPEARANCEUR HAZY (A) 01/17/2020 1827   LABSPEC 1.026 01/17/2020 1827   PHURINE 5.0 01/17/2020 1827   GLUCOSEU NEGATIVE 01/17/2020 1827   GLUCOSEU NEGATIVE 04/18/2014 1324   HGBUR NEGATIVE 01/17/2020 1827   BILIRUBINUR NEGATIVE 01/17/2020 1827   KETONESUR 20 (A) 01/17/2020 1827   PROTEINUR 30 (A) 01/17/2020 1827   UROBILINOGEN 0.2 04/18/2014 1324   NITRITE NEGATIVE 01/17/2020 1827   LEUKOCYTESUR NEGATIVE 01/17/2020 1827   Sepsis Labs: @LABRCNTIP (procalcitonin:4,lacticidven:4) )No results found for this or any previous visit (from the past 240 hours).   Radiological Exams on Admission:   Assessment/Plan Principal Problem:   Acute respiratory failure with hypoxia (HCC) Active Problems:   Acute on chronic  combined systolic and diastolic congestive heart failure (HCC)   Asthma exacerbation   Essential hypertension   Hypertensive emergency   Atrial flutter with rapid ventricular response (HCC)   Atrial fibrillation, chronic (HCC)   Unresponsiveness   Myocardial injury   Hyperkalemia   Assessment and Plan:   Principal Problem:   Acute respiratory failure with hypoxia (HCC) Active Problems:   Acute on chronic combined systolic and diastolic congestive heart failure (HCC)   Asthma exacerbation  Essential hypertension   Hypertensive emergency   Atrial flutter with rapid ventricular response (HCC)   Atrial fibrillation, chronic (HCC)   Unresponsiveness   Myocardial injury   Hyperkalemia      DVT ppx: on Eliquis   Code Status: Full code per pt  Family Communication: Yes, patient's husband and daughter   at bed side.      Disposition Plan:  Anticipate discharge back to previous environment  Consults called:  none  Admission status and Level of care: Progressive: as inpt        Dispo: The patient is from: Home              Anticipated d/c is to: Home              Anticipated d/c date is: 2 days              Patient currently is not medically stable to d/c.    Severity of Illness:  The appropriate patient status for this patient is INPATIENT. Inpatient status is judged to be reasonable and necessary in order to provide the required intensity of service to ensure the patient's safety. The patient's presenting symptoms, physical exam findings, and initial radiographic and laboratory data in the context of their chronic comorbidities is felt to place them at high risk for further clinical deterioration. Furthermore, it is not anticipated that the patient will be medically stable for discharge from the hospital within 2 midnights of admission.   * I certify that at the point of admission it is my clinical judgment that the patient will require inpatient hospital care spanning  beyond 2 midnights from the point of admission due to high intensity of service, high risk for further deterioration and high frequency of surveillance required.*       Date of Service 07/16/2023    Caleb Exon [facility]   If 7PM-7AM, please contact night-coverage [email or web address] 07/16/2023, 11:58 PM

## 2023-07-17 ENCOUNTER — Inpatient Hospital Stay
Admit: 2023-07-17 | Discharge: 2023-07-17 | Disposition: A | Discharge status: Home / Self Care | Attending: Internal Medicine | Admitting: Internal Medicine

## 2023-07-17 ENCOUNTER — Other Ambulatory Visit: Payer: Self-pay

## 2023-07-17 DIAGNOSIS — N179 Acute kidney failure, unspecified: Secondary | ICD-10-CM | POA: Diagnosis present

## 2023-07-17 DIAGNOSIS — T68XXXA Hypothermia, initial encounter: Secondary | ICD-10-CM | POA: Diagnosis present

## 2023-07-17 DIAGNOSIS — J9601 Acute respiratory failure with hypoxia: Secondary | ICD-10-CM

## 2023-07-17 DIAGNOSIS — R112 Nausea with vomiting, unspecified: Secondary | ICD-10-CM | POA: Diagnosis present

## 2023-07-17 DIAGNOSIS — R0902 Hypoxemia: Secondary | ICD-10-CM | POA: Diagnosis present

## 2023-07-17 LAB — CBC
HCT: 38.6 % (ref 36.0–46.0)
Hemoglobin: 13.7 g/dL (ref 12.0–15.0)
MCH: 32.9 pg (ref 26.0–34.0)
MCHC: 35.5 g/dL (ref 30.0–36.0)
MCV: 92.8 fL (ref 80.0–100.0)
Platelets: 244 10*3/uL (ref 150–400)
RBC: 4.16 MIL/uL (ref 3.87–5.11)
RDW: 13.3 % (ref 11.5–15.5)
WBC: 9.4 10*3/uL (ref 4.0–10.5)
nRBC: 0 % (ref 0.0–0.2)

## 2023-07-17 LAB — ECHOCARDIOGRAM COMPLETE
AV Mean grad: 3 mmHg
AV Peak grad: 5.1 mmHg
Ao pk vel: 1.13 m/s
Area-P 1/2: 4.6 cm2
Calc EF: 65.6 %
Single Plane A2C EF: 78 %
Single Plane A4C EF: 56.2 %

## 2023-07-17 LAB — BASIC METABOLIC PANEL WITH GFR
Anion gap: 10 (ref 5–15)
BUN: 28 mg/dL — ABNORMAL HIGH (ref 8–23)
CO2: 24 mmol/L (ref 22–32)
Calcium: 9.3 mg/dL (ref 8.9–10.3)
Chloride: 105 mmol/L (ref 98–111)
Creatinine, Ser: 0.82 mg/dL (ref 0.44–1.00)
GFR, Estimated: >60 mL/min (ref >60)
Glucose, Bld: 167 mg/dL — ABNORMAL HIGH (ref 70–99)
Potassium: 3.3 mmol/L — ABNORMAL LOW (ref 3.5–5.1)
Sodium: 139 mmol/L (ref 135–145)

## 2023-07-17 LAB — URINALYSIS, ROUTINE W REFLEX MICROSCOPIC
Bilirubin Urine: NEGATIVE
Glucose, UA: 150 mg/dL — AB
Hgb urine dipstick: NEGATIVE
Ketones, ur: NEGATIVE mg/dL
Leukocytes,Ua: NEGATIVE
Nitrite: NEGATIVE
Protein, ur: NEGATIVE mg/dL
Specific Gravity, Urine: 1.008 (ref 1.005–1.030)
pH: 5 (ref 5.0–8.0)

## 2023-07-17 LAB — RESP PANEL BY RT-PCR (RSV, FLU A&B, COVID)  RVPGX2
Influenza A by PCR: NEGATIVE
Influenza B by PCR: NEGATIVE
Resp Syncytial Virus by PCR: NEGATIVE
SARS Coronavirus 2 by RT PCR: NEGATIVE

## 2023-07-17 LAB — LIPID PANEL
Cholesterol: 286 mg/dL — ABNORMAL HIGH (ref 0–200)
HDL: 73 mg/dL (ref >40)
LDL Cholesterol: 202 mg/dL — ABNORMAL HIGH (ref 0–99)
Total CHOL/HDL Ratio: 3.9 ratio
Triglycerides: 55 mg/dL (ref <150)
VLDL: 11 mg/dL (ref 0–40)

## 2023-07-17 LAB — LIPASE, BLOOD: Lipase: 30 U/L (ref 11–51)

## 2023-07-17 LAB — EXPECTORATED SPUTUM ASSESSMENT W GRAM STAIN, RFLX TO RESP C

## 2023-07-17 LAB — CBG MONITORING, ED
Glucose-Capillary: 147 mg/dL — ABNORMAL HIGH (ref 70–99)
Glucose-Capillary: 207 mg/dL — ABNORMAL HIGH (ref 70–99)

## 2023-07-17 LAB — TROPONIN I (HIGH SENSITIVITY)
Troponin I (High Sensitivity): 54 ng/L — ABNORMAL HIGH (ref <18)
Troponin I (High Sensitivity): 97 ng/L — ABNORMAL HIGH (ref <18)

## 2023-07-17 MED ORDER — LATANOPROST 0.005 % OP SOLN
1.0000 [drp] | Freq: Every day | OPHTHALMIC | Status: DC
  Filled 2023-07-17: qty 2.5

## 2023-07-17 MED ORDER — APIXABAN 2.5 MG PO TABS
2.5000 mg | ORAL_TABLET | Freq: Two times a day (BID) | ORAL | Status: DC
  Administered 2023-07-17 (×2): 2.5 mg via ORAL
  Filled 2023-07-17 (×2): qty 1

## 2023-07-17 MED ORDER — COQ10 100 MG PO CAPS: 100.0000 mg | ORAL_CAPSULE | Freq: Every day | ORAL | Status: DC

## 2023-07-17 MED ORDER — PERFLUTREN LIPID MICROSPHERE
1.0000 mL | INTRAVENOUS | Status: AC | PRN
  Administered 2023-07-17: 2 mL via INTRAVENOUS

## 2023-07-17 MED ORDER — METHOCARBAMOL 500 MG PO TABS: 500.0000 mg | ORAL_TABLET | Freq: Three times a day (TID) | ORAL | Status: DC | PRN

## 2023-07-17 MED ORDER — DORZOLAMIDE HCL-TIMOLOL MAL 2-0.5 % OP SOLN
1.0000 [drp] | Freq: Two times a day (BID) | OPHTHALMIC | Status: DC
  Filled 2023-07-17: qty 10

## 2023-07-17 MED ORDER — ALBUTEROL SULFATE (2.5 MG/3ML) 0.083% IN NEBU: 2.5000 mg | INHALATION_SOLUTION | RESPIRATORY_TRACT | 0 refills | Status: DC | PRN

## 2023-07-17 MED ORDER — POTASSIUM CHLORIDE CRYS ER 20 MEQ PO TBCR
40.0000 meq | EXTENDED_RELEASE_TABLET | Freq: Two times a day (BID) | ORAL | Status: DC
  Administered 2023-07-17: 40 meq via ORAL
  Filled 2023-07-17: qty 2

## 2023-07-17 MED ORDER — FLUTICASONE PROPIONATE HFA 44 MCG/ACT IN AERO: 2.0000 | INHALATION_SPRAY | Freq: Two times a day (BID) | RESPIRATORY_TRACT | 0 refills | Status: AC

## 2023-07-17 MED ORDER — ADULT MULTIVITAMIN W/MINERALS CH
1.0000 | ORAL_TABLET | Freq: Every day | ORAL | Status: DC
  Administered 2023-07-17: 1 via ORAL
  Filled 2023-07-17: qty 1

## 2023-07-17 NOTE — Progress Notes (Signed)
 Heart Failure Navigator Progress Note  Assessed for Heart & Vascular TOC clinic readiness.  Patient does not meet criteria due to current [facility] patient.   Navigator will sign off at this time.  Roxy Horseman, RN, BSN [facility] Heart Failure Navigator Secure Chat Only

## 2023-07-17 NOTE — Discharge Instructions (Addendum)
 Please make an appointment with Dr Theotis as soon as available to review what we can do to help keep your breathing safe.  I would like you to also see your primary doctor within a week to review your test results and re check your blood work Please take the inhaler twice per day that I prescribed and the nebulizer treatments as needed for shortness of breath

## 2023-07-17 NOTE — Discharge Summary (Signed)
 Physician Discharge Summary  Patient: Kathleen Wallace FMW:[id number] DOB: May 19, 1933   Code Status: Full Code Admit date: 07/16/2023 Discharge date: 07/17/2023 Disposition: Home, No home health services recommended PCP: Glendia Shad, MD  Recommendations for Outpatient Follow-up:  Follow up with PCP within 1-2 weeks Regarding general hospital follow up and preventative care Recommend BMP. Had mild hypokalemia after receiving diuretics  Follow up with pulmonology   Discharge Diagnoses:  Principal Problem:   Acute respiratory failure with hypoxia (HCC) Active Problems:   Acute on chronic combined systolic and diastolic congestive heart failure (HCC)   Asthma exacerbation   Essential hypertension   Hypertensive emergency   Atrial flutter with rapid ventricular response (HCC)   Atrial fibrillation, chronic (HCC)   Unresponsiveness   Myocardial injury   Hyperkalemia   AKI (acute kidney injury) (HCC)   Hypothermia   Nausea & vomiting   Hypoxia  Brief Hospital Course Summary:  Kathleen Wallace is a 89 y.o. female with medical history significant of CHF with EF 45%, HTN, HLD, asthma with bronchitis, GERD, A-fib on Eliquis , left bundle blockade, skin cancer, LBBB, who presents with SOB and unresponsiveness. She became unresponsive so her husband brought her to the local fire station where they thought she had no pulse so gave her 1 round of CPR without shock or medications administered. Her blood pressure was 213/123. EMS started her on a non-rebreather and in the ED changed to BiPap.   ED Course: BNP 524, troponin 21, potassium 6.1, magnesium  2.3, creatinine 0.95, BUN 29, GFR 58, temperature 97.1.  ABG with pH 7.33, CO2 34, O2 179.  Negative chest x-ray.  CT head- no acute abnormalities.  She was also given 125 mg of Solu-Medrol , IV diuretic and started on 15 mg of nitroglycerin  patch in ED. her blood pressure improved with SBP 120-130s.   She was able to wean from the oxygen on day of  discharge and had no desats even with ambulation. Appears that she had a significant asthma exacerbation which improved with inhalers. She was prescribed a daily inhaler and nebulizer refill at dc. She was instructed to follow up with her pulmonologist at earliest available appointment.   Cardiology saw and did not recommend further acute workup. They will follow up outpatient. Troponin elevation likely as result of demand ischemia from hypertensive emergency and CPR.   Evaluated by PT and do not recommend further workup.   All other chronic conditions were treated with home medications.    Discharge Condition: Good, improved Recommended discharge diet: Regular healthy diet  Consultations: None   Procedures/Studies: None   Allergies as of 07/17/2023       Reactions   Other Anaphylaxis   Codeine Nausea Only, Other (See Comments)   tachycardia   Amoxicillin    insomnia Did it involve swelling of the face/tongue/throat, SOB, or low BP? No Did it involve sudden or severe rash/hives, skin peeling, or any reaction on the inside of your mouth or nose? No Did you need to seek medical attention at a hospital or doctor's office? No When did it last happen?      years ago If all above answers are NO, may proceed with cephalosporin use.        Medication List     STOP taking these medications    amLODipine  5 MG tablet Commonly known as: NORVASC    latanoprost  0.005 % ophthalmic solution Commonly known as: XALATAN        TAKE these medications  albuterol  108 (90 Base) MCG/ACT inhaler Commonly known as: VENTOLIN  HFA Inhale 2 puffs into the lungs every 6 (six) hours as needed for wheezing or shortness of breath. What changed: Another medication with the same name was added. Make sure you understand how and when to take each.   albuterol  (2.5 MG/3ML) 0.083% nebulizer solution Commonly known as: PROVENTIL  Take 3 mLs (2.5 mg total) by nebulization every 4 (four) hours as  needed for wheezing or shortness of breath. What changed: You were already taking a medication with the same name, and this prescription was added. Make sure you understand how and when to take each.   apixaban  2.5 MG Tabs tablet Commonly known as: Eliquis  Take 1 tablet (2.5 mg total) by mouth 2 (two) times daily.   ascorbic acid  500 MG tablet Commonly known as: VITAMIN C Take 500 mg by mouth daily.   CoQ10 100 MG Caps Take 100 mg by mouth daily.   dorzolamidel-timolol  22.3-6.8 MG/ML Soln ophthalmic solution Commonly known as: COSOPT  Place 1 drop into both eyes 2 (two) times daily.   fluticasone  44 MCG/ACT inhaler Commonly known as: FLOVENT  HFA Inhale 2 puffs into the lungs 2 (two) times daily.   multivitamin with minerals tablet Take 1 tablet by mouth daily.   Rocklatan 0.02-0.005 % Soln Generic drug: Netarsudil-Latanoprost  SMARTSIG:1 Drop(s) In [employer]) Every Evening        Follow-up Information     Peletier, Caralyn, PA-C. Go in 1 week(s).   Specialty: Cardiology Why: Appointment scheduled with [facility] on Tuesday 07/28/23 at 10:30 AM Contact information: [address] [place] [address] [phone]                 Subjective   Pt reports feeling well. She denies any SOB, CP. She feels that she has poor balance but no concerns when working with PT.   All questions and concerns were addressed at time of discharge.  Objective  Blood pressure 117/61, pulse 97, temperature 98.1 F (36.7 C), temperature source Oral, resp. rate 18, SpO2 96%.   General: Pt is alert, awake, not in acute distress Cardiovascular: RRR, S1/S2 +, no rubs, no gallops Respiratory: CTA bilaterally, no wheezing, no rhonchi Abdominal: Soft, NT, ND, bowel sounds + Extremities: no edema, no cyanosis  The results of significant diagnostics from this hospitalization (including imaging, microbiology, ancillary and laboratory) are listed below for reference.    Imaging studies: CT HEAD WO CONTRAST ( ) Result Date: 07/17/2023 CLINICAL DATA:  Mental status change, unknown cause EXAM: CT HEAD WITHOUT CONTRAST TECHNIQUE: Contiguous axial images were obtained from the base of the skull through the vertex without intravenous contrast. RADIATION DOSE REDUCTION: This exam was performed according to the departmental dose-optimization program which includes automated exposure control, adjustment of the mA and/or kV according to patient size and/or use of iterative reconstruction technique. COMPARISON:  CT head 11/30/2013. FINDINGS: Brain: Patchy and confluent areas of decreased attenuation are noted throughout the deep and periventricular white matter of the cerebral hemispheres bilaterally, compatible with chronic microvascular ischemic disease. Chronic nonspecific punctate calcification along the left basal ganglia. No evidence of large-territorial acute infarction. No parenchymal hemorrhage. No mass lesion. No extra-axial collection. No mass effect or midline shift. No hydrocephalus. Basilar cisterns are patent. Vascular: No hyperdense vessel. Skull: No acute fracture or focal lesion. Bilateral temporomandibular joint degenerative changes. Sinuses/Orbits: Bilateral maxillary sinus mucosal thickening. Bilateral ethmoid sinus almost complete opacification. Left frontal and right sphenoid mucosal thickening. Paranasal sinuses and mastoid  air cells are clear. The orbits are unremarkable. Other: None. IMPRESSION: 1. No acute intracranial abnormality. 2. Sinus disease. Electronically Signed   By: Morgane  Naveau M.D.   On: 07/17/2023 01:01   DG Chest Portable 1 View Result Date: 07/16/2023 CLINICAL DATA:  Respiratory distress EXAM: PORTABLE CHEST 1 VIEW COMPARISON:  Chest x-ray 04/21/2023 FINDINGS: There is no focal lung infiltrate, pleural effusion or pneumothorax. The cardiomediastinal silhouette is within normal limits. Peripherally calcified breast implants are present  bilaterally. No acute fractures are seen. IMPRESSION: No active disease. Electronically Signed   By: Greig Pique M.D.   On: 07/16/2023 23:00   US  THYROID  Result Date: 07/16/2023 CLINICAL DATA:  Thyroid  mass EXAM: THYROID  ULTRASOUND TECHNIQUE: Ultrasound examination of the thyroid  gland and adjacent soft tissues was performed. COMPARISON:  None Available. FINDINGS: Parenchymal Echotexture: Normal Isthmus: 2.2 cm Right lobe: 3.8 x 1.4 x 1.3 cm Left lobe: 3.4 x 1.2 x 1.5 cm _________________________________________________________ Estimated total number of nodules >/= 1 cm: 3 Number of spongiform nodules >/=  2 cm not described below (TR1): 0 Number of mixed cystic and solid nodules >/= 1.5 cm not described below (TR2): 0 _________________________________________________________ Nodule # 1: Location: Left; Superior Maximum size: 1.2 cm; Other 2 dimensions: 0.9 x 0.7 cm Composition: solid/almost completely solid (2) Echogenicity: isoechoic (1) Shape: not taller-than-wide (0) Margins: ill-defined (0) Echogenic foci: none (0) ACR TI-RADS total points: 3. ACR TI-RADS risk category: TR3 (3 points). ACR TI-RADS recommendations: Given size (<1.4 cm) and appearance, this nodule does NOT meet TI-RADS criteria for biopsy or dedicated follow-up. _________________________________________________________ Nodule # 2: Location: Left; Superior Maximum size: 1.7 cm; Other 2 dimensions: 1.1 x 0.7 cm Composition: solid/almost completely solid (2) Echogenicity: isoechoic (1) Shape: not taller-than-wide (0) Margins: ill-defined (0) Echogenic foci: punctate echogenic foci (3) ACR TI-RADS total points: 6. ACR TI-RADS risk category: TR4 (4-6 points). ACR TI-RADS recommendations: **Given size (>/= 1.5 cm) and appearance, fine needle aspiration of this moderately suspicious nodule should be considered based on TI-RADS criteria. _________________________________________________________ Nodule # 3: Location: Left; Inferior Maximum size: 1.0  cm; Other 2 dimensions: 0.5 x 0.5 cm Composition: solid/almost completely solid (2) Echogenicity: isoechoic (1) Shape: not taller-than-wide (0) Margins: ill-defined (0) Echogenic foci: punctate echogenic foci (3) ACR TI-RADS total points: 6. ACR TI-RADS risk category: TR4 (4-6 points). ACR TI-RADS recommendations: *Given size (>/= 1 - 1.4 cm) and appearance, a follow-up ultrasound in 1 year should be considered based on TI-RADS criteria. _________________________________________________________ IMPRESSION: 1. A TR 4 nodule is present in the upper pole the left thyroid  measuring 1.7 cm. **Given size (>/= 1.5 cm) and appearance, fine needle aspiration of this moderately suspicious nodule should be considered based on TI-RADS criteria. 2. A second TR 4 nodule is present in the lower pole of the left thyroid  measuring 1.0 cm. *Given size (>/= 1 - 1.4 cm) and appearance, a follow-up ultrasound in 1 year should be considered based on TI-RADS criteria. The above is in keeping with the ACR TI-RADS recommendations - J Am Coll Radiol 2017;14:587-595. Electronically Signed   By: Maude Naegeli M.D.   On: 07/16/2023 11:47   DG Shoulder Right Result Date: 07/06/2023 CLINICAL DATA:  persistent right shoulder pain s/p fall EXAM: RIGHT SHOULDER - 2+ VIEW COMPARISON:  April 21, 2023 FINDINGS: Osteopenia.No acute fracture or dislocation. There is no evidence of arthropathy or other focal bone abnormality. Soft tissues are unremarkable. Apparent prominence of the aorta is likely due to patient rotation. Partially calcified breast implants. IMPRESSION:  No acute fracture or dislocation. Electronically Signed   By: Rogelia Myers M.D.   On: 07/06/2023 10:18    Labs: Basic Metabolic Panel: Recent Labs  Lab 07/16/23 2136 07/17/23 0549  NA 137 139  K 6.1* 3.3*  CL 108 105  CO2 19* 24  GLUCOSE 241* 167*  BUN 29* 28*  CREATININE 0.95 0.82  CALCIUM  8.3* 9.3  MG 2.3  --    CBC: Recent Labs  Lab 07/16/23 2137  07/17/23 0549  WBC 8.9 9.4  NEUTROABS 3.7  --   HGB 12.9 13.7  HCT 39.6 38.6  MCV 101.3* 92.8  PLT 320 244   Microbiology: Results for orders placed or performed during the hospital encounter of 07/16/23  Resp panel by RT-PCR (RSV, Flu A&B, Covid) Anterior Nasal Swab     Status: None   Collection Time: 07/16/23 11:55 PM   Specimen: Anterior Nasal Swab  Result Value Ref Range Status   SARS Coronavirus 2 by RT PCR NEGATIVE NEGATIVE Final    Comment: (NOTE) SARS-CoV-2 target nucleic acids are NOT DETECTED.  The SARS-CoV-2 RNA is generally detectable in upper respiratory specimens during the acute phase of infection. The lowest concentration of SARS-CoV-2 viral copies this assay can detect is 138 copies/mL. A negative result does not preclude SARS-Cov-2 infection and should not be used as the sole basis for treatment or other patient management decisions. A negative result may occur with  improper specimen collection/handling, submission of specimen other than nasopharyngeal swab, presence of viral mutation(s) within the areas targeted by this assay, and inadequate number of viral copies(<138 copies/mL). A negative result must be combined with clinical observations, patient history, and epidemiological information. The expected result is Negative.  Fact Sheet for Patients:  BloggerCourse.com  Fact Sheet for Healthcare Providers:  SeriousBroker.it  This test is no t yet approved or cleared by the [place]  FDA and  has been authorized for detection and/or diagnosis of SARS-CoV-2 by FDA under an Emergency Use Authorization (EUA). This EUA will remain  in effect (meaning this test can be used) for the duration of the COVID-19 declaration under Section 564(b)(1) of the Act, 21 U.S.C.section 360bbb-3(b)(1), unless the authorization is terminated  or revoked sooner.       Influenza A by PCR NEGATIVE NEGATIVE Final   Influenza  B by PCR NEGATIVE NEGATIVE Final    Comment: (NOTE) The Xpert Xpress SARS-CoV-2/FLU/RSV plus assay is intended as an aid in the diagnosis of influenza from Nasopharyngeal swab specimens and should not be used as a sole basis for treatment. Nasal washings and aspirates are unacceptable for Xpert Xpress SARS-CoV-2/FLU/RSV testing.  Fact Sheet for Patients: BloggerCourse.com  Fact Sheet for Healthcare Providers: SeriousBroker.it  This test is not yet approved or cleared by the [place]  FDA and has been authorized for detection and/or diagnosis of SARS-CoV-2 by FDA under an Emergency Use Authorization (EUA). This EUA will remain in effect (meaning this test can be used) for the duration of the COVID-19 declaration under Section 564(b)(1) of the Act, 21 U.S.C. section 360bbb-3(b)(1), unless the authorization is terminated or revoked.     Resp Syncytial Virus by PCR NEGATIVE NEGATIVE Final    Comment: (NOTE) Fact Sheet for Patients: BloggerCourse.com  Fact Sheet for Healthcare Providers: SeriousBroker.it  This test is not yet approved or cleared by the [place]  FDA and has been authorized for detection and/or diagnosis of SARS-CoV-2 by FDA under an Emergency Use Authorization (EUA). This EUA will remain in effect (meaning  this test can be used) for the duration of the COVID-19 declaration under Section 564(b)(1) of the Act, 21 U.S.C. section 360bbb-3(b)(1), unless the authorization is terminated or revoked.  Performed at [facility], [address] [place], [place] [address]   Expectorated Sputum Assessment w Gram Stain, Rflx to Resp Cult     Status: None   Collection Time: 07/16/23 11:55 PM   Specimen: Expectorated Sputum  Result Value Ref Range Status   Specimen Description EXPECTORATED SPUTUM  Final   Special Requests NONE  Final   Sputum evaluation    Final    Sputum specimen not acceptable for testing.  Please recollect.   RESULT CALLED TO, READ BACK BY AND VERIFIED WITHBETHA FLAKES [facility] AT [id number] 07/17/23 JG Performed at [facility], [address] [place], [place] [address]    Report Status 07/17/2023 FINAL  Final    Time coordinating discharge: Over 30 minutes  Marien LITTIE Piety, MD  [facility] 07/17/2023, 2:43 PM

## 2023-07-17 NOTE — Evaluation (Signed)
 Physical Therapy Evaluation Patient Details Name: Kathleen Wallace MRN: [id number] DOB: [birth year] Today's Date: 07/17/2023  History of Present Illness  89 y/o female presented to ED on 07/16/23 after being unresponsive and 1 round of CPR. Admitted for acute respiratory failure with hypoxia. PMH: Afib, HTN, asthma, CHF with EF 45%, LBBB  Clinical Impression  Patient admitted with the above. PTA, patient lives with husband and was very active and independent. Still mows/weed eats yards. Patient functioning at independent level with no AD. VSS on RA throughout session. Patient is at or near functional baseline. No further skilled PT needs identified acutely. No PT follow up recommended at this time. Will sign off at this time.         If plan is discharge home, recommend the following:     Can travel by private vehicle        Equipment Recommendations None recommended by PT  Recommendations for Other Services       Functional Status Assessment Patient has not had a recent decline in their functional status     Precautions / Restrictions Precautions Precautions: Fall Recall of Precautions/Restrictions: Intact Restrictions Weight Bearing Restrictions Per Provider Order: No      Mobility  Bed Mobility Overal bed mobility: Independent                  Transfers Overall transfer level: Independent Equipment used: None                    Ambulation/Gait Ambulation/Gait assistance: Independent Gait Distance (Feet): 120 Feet Assistive device: None Gait Pattern/deviations: WFL(Within Functional Limits) Gait velocity: WFL        Stairs            Wheelchair Mobility     Tilt Bed    Modified Rankin (Stroke Patients Only)       Balance Overall balance assessment: Independent                                           Pertinent Vitals/Pain Pain Assessment Pain Assessment: No/denies pain    Home Living Family/patient expects  to be discharged to:: Private residence Living Arrangements: Spouse/significant other Available Help at Discharge: Family Type of Home: House Home Access: Stairs to enter Entrance Stairs-Rails: Right;Left;Can reach both Secretary/administrator of Steps: 8   Home Layout: One level Home Equipment: None      Prior Function Prior Level of Function : Independent/Modified Independent;Driving;History of Falls (last six months)             Mobility Comments: hx of 1 fall in past 6 months       Extremity/Trunk Assessment   Upper Extremity Assessment Upper Extremity Assessment: Overall WFL for tasks assessed    Lower Extremity Assessment Lower Extremity Assessment: Overall WFL for tasks assessed    Cervical / Trunk Assessment Cervical / Trunk Assessment: Normal  Communication   Communication Communication: Impaired Factors Affecting Communication: Hearing impaired    Cognition Arousal: Alert Behavior During Therapy: WFL for tasks assessed/performed   PT - Cognitive impairments: No apparent impairments                         Following commands: Intact       Cueing       General Comments General comments (skin integrity, edema, etc.): VSS on  RA    Exercises     Assessment/Plan    PT Assessment Patient does not need any further PT services  PT Problem List         PT Treatment Interventions      PT Goals (Current goals can be found in the Care Plan section)  Acute Rehab PT Goals Patient Stated Goal: to go home PT Goal Formulation: All assessment and education complete, DC therapy    Frequency       Co-evaluation               AM-PAC PT 6 Clicks Mobility  Outcome Measure Help needed turning from your back to your side while in a flat bed without using bedrails?: None Help needed moving from lying on your back to sitting on the side of a flat bed without using bedrails?: None Help needed moving to and from a bed to a chair  (including a wheelchair)?: None Help needed standing up from a chair using your arms (e.g., wheelchair or bedside chair)?: None Help needed to walk in hospital room?: None Help needed climbing 3-5 steps with a railing? : None 6 Click Score: 24    End of Session   Activity Tolerance: Patient tolerated treatment well Patient left: in bed;with call bell/phone within reach Nurse Communication: Mobility status PT Visit Diagnosis: Muscle weakness (generalized) (M62.81)    Time: 8592-8577 PT Time Calculation (min) (ACUTE ONLY): 15 min   Charges:   PT Evaluation $PT Eval Low Complexity: 1 Low   PT General Charges $$ ACUTE PT VISIT: 1 Visit         Maryanne Finder, PT, DPT Physical Therapist - [facility]  [facility]   Levis Nazir A Kaydense Rizo 07/17/2023, 2:31 PM

## 2023-07-17 NOTE — Care Management Obs Status (Signed)
 MEDICARE OBSERVATION STATUS NOTIFICATION   Patient Details  Name: Kathleen Wallace MRN: [id number] Date of Birth: [birth year]   Medicare Observation Status Notification Given:  Yes    Dalia GORMAN Fuse, RN 07/17/2023, 2:55 PM

## 2023-07-17 NOTE — ED Notes (Signed)
 Pt calls, this RN walks by and answers CB as primary RN w/critical Pt. This RN cleans Pt up, new sheets, gown and chuck applied. Purewick reinforced.

## 2023-07-17 NOTE — Progress Notes (Incomplete)
 PROGRESS NOTE  Kathleen Wallace    DOB: 10/29/1933, 89 y.o.  FMW:[id number]    Code Status: Full Code   DOA: 07/16/2023   LOS: 1   Brief hospital course  Kathleen Wallace is a 89 y.o. female with a PMH significant for ***.  They presented from *** to the ED on 07/16/2023 with *** x *** days. ***  In the ED, it was found that they had ***.  Significant findings included ***.  They were initially treated with ***.   Patient was admitted to medicine service for further workup and management of *** as outlined in detail below.  07/17/23 -***  Assessment & Plan  Principal Problem:   Acute respiratory failure with hypoxia (HCC) Active Problems:   Acute on chronic combined systolic and diastolic congestive heart failure (HCC)   Asthma exacerbation   Essential hypertension   Hypertensive emergency   Atrial flutter with rapid ventricular response (HCC)   Atrial fibrillation, chronic (HCC)   Unresponsiveness   Myocardial injury   Hyperkalemia   AKI (acute kidney injury) (HCC)   Hypothermia   Nausea & vomiting  Acute respiratory failure with hypoxia [facility]): Patient had severe respiratory distress, gasping for air initially, required BiPAP.  Likely due to multifactorial etiology, including hypertensive emergency, possible flash pulm edema, CHF exacerbation, asthma exacerbation and A flutter with RVR.  After treatment with 125 mg for Solu-Medrol , nitroglycerin  patch, nebulizer in ED, her respiratory distress has resolved.  Currently is off BiPAP. -Admitted to PCU as inpatient - Bronchodilators, as needed Mucinex  - Nasal cannula oxygen to maintain oxygen saturation above 93% on room air. - Treat underlying issues as below - check RESP viral panel - follows with pulmonology- Dr Theotis. Seen 4/25 for asthma/bronchitis    Acute on chronic combined systolic and diastolic congestive heart failure [facility]): Patient has trace leg edema, positive JVD, crackles on auscultation, elevated BNP 524, clinically  consistent with CHF exacerbation.  Patient may to have had flash pulm edema due to hypertensive emergency. -Lasix  60 mg x 1 dose , then 40 mg bid by IV tomorrow -2d echo -Daily weights -strict I/O's -Low salt diet -Fluid restriction -As needed bronchodilators for shortness of breath   Asthma exacerbation -Bronchodilators and prn Mucinex  -Solu-Medrol  80 mg IV daily after given 125 mg of Solu-Medrol  -Incentive spirometry   Essential hypertension and hypertensive emergency: initial Bp 213/123, which has improved to SBP 120-130 after started nitroglycerin  patch 15 mg.  Patient states that she is not taking her amlodipine  5 mg daily now - Restart amlodipine  5 mg daily - IV hydralazine  as needed   Atrial flutter with rapid ventricular response and hx of atrial fibrillation, chronic: Heart rate has improved to 130 to 80s - As needed metoprolol  IV 2.5 mg for heart rate> 125 - continue Eliquis  - f/u 2d echo. If EF is not too low, may consider to start metoprolol  or Cardizem    Unresponsiveness: Possibly due to severe respiratory distress.  Patient is currently oriented x 3.  No focal neurodeficit on physical examination. - Frequent neurocheck - Fall precaution - Follow-up CT scan of head   Myocardial injury: Troponin 21, no chest pain, likely demand ischemia. -Trend troponin - Check FLP - Patient had A1c 5.4 on 07/06/2023 - Will not give aspirin since patient is on Eliquis  - Follow-up 2D echo   Hyperkalemia: Potassium 6.1. - Treated with 1 g calcium  gluconate, D50, NovoLog  10 unit - Give 10 g Lokelma    AKI: mild.  Her  baseline GFR> 60.  Today GFR 58, creatinine 0.95, BUN 29. - Follow-up with BMP - Avoid using renal toxic medications.   Hypothermia: Body temperature 97.1 -Bair hugger   Nausea and vomiting: Etiology is not clear.  Patient did not have nausea vomiting, but developed nausea vomiting when she was on BiPAP.  Denies abdominal pain or diarrhea.  No fever or chills.  Liver  function okay. -Check lipase - As needed Benadryl   There is no height or weight on file to calculate BMI.  VTE ppx: apixaban  (ELIQUIS ) tablet 2.5 mg Start: 07/17/23 0130 apixaban  (ELIQUIS ) tablet 2.5 mg   Diet:     Diet   Diet heart healthy/carb modified Fluid consistency: Thin; Fluid restriction: 1500 mL Fluid   Consultants: ***  Subjective 07/17/23    Pt reports ***   Objective  Blood pressure (!) 122/56, pulse 91, temperature (!) 97.4 F (36.3 C), temperature source Tympanic, resp. rate (!) 23, SpO2 97%.  Intake/Output Summary (Last 24 hours) at 07/17/2023 0745 Last data filed at 07/17/2023 0208 Gross per 24 hour  Intake 50 ml  Output --  Net 50 ml   There were no vitals filed for this visit.   Physical Exam: *** General: awake, alert, NAD HEENT: atraumatic, clear conjunctiva, anicteric sclera, MMM, hearing grossly normal Respiratory: normal respiratory effort. Cardiovascular: quick capillary refill, normal S1/S2, RRR, no JVD, murmurs Gastrointestinal: soft, NT, ND Nervous: A&O x3. no gross focal neurologic deficits, normal speech Extremities: moves all equally, no edema, normal tone Skin: dry, intact, normal temperature, normal color. No rashes, lesions or ulcers on exposed skin Psychiatry: normal mood, congruent affect  Labs   I have personally reviewed the following labs and imaging studies CBC    Component Value Date/Time   WBC 9.4 07/17/2023 0549   RBC 4.16 07/17/2023 0549   HGB 13.7 07/17/2023 0549   HGB 13.1 07/06/2023 0926   HCT 38.6 07/17/2023 0549   HCT 41.6 07/06/2023 0926   PLT 244 07/17/2023 0549   PLT 313 07/06/2023 0926   MCV 92.8 07/17/2023 0549   MCV 99 (H) 07/06/2023 0926   MCV 99 08/24/2011 2149   MCH 32.9 07/17/2023 0549   MCHC 35.5 07/17/2023 0549   RDW 13.3 07/17/2023 0549   RDW 13.2 07/06/2023 0926   RDW 13.0 08/24/2011 2149   LYMPHSABS 3.4 07/16/2023 2137   LYMPHSABS 1.6 07/06/2023 0926   MONOABS 0.8 07/16/2023 2137    EOSABS 0.9 (H) 07/16/2023 2137   EOSABS 0.3 07/06/2023 0926   BASOSABS 0.0 07/16/2023 2137   BASOSABS 0.0 07/06/2023 0926      Latest Ref Rng & Units 07/17/2023    5:49 AM 07/16/2023    9:36 PM 07/06/2023    9:26 AM  BMP  Glucose 70 - 99 mg/dL 832  758  97   BUN 8 - 23 mg/dL 28  29  21    Creatinine 0.44 - 1.00 mg/dL 9.17  9.04  9.15   BUN/Creat Ratio 12 - 28   25   Sodium 135 - 145 mmol/L 139  137  139   Potassium 3.5 - 5.1 mmol/L 3.3  6.1  4.9   Chloride 98 - 111 mmol/L 105  108  105   CO2 22 - 32 mmol/L 24  19  20    Calcium  8.9 - 10.3 mg/dL 9.3  8.3  9.6     CT HEAD WO CONTRAST ( ) Result Date: 07/17/2023 CLINICAL DATA:  Mental status change, unknown cause EXAM: CT HEAD WITHOUT  CONTRAST TECHNIQUE: Contiguous axial images were obtained from the base of the skull through the vertex without intravenous contrast. RADIATION DOSE REDUCTION: This exam was performed according to the departmental dose-optimization program which includes automated exposure control, adjustment of the mA and/or kV according to patient size and/or use of iterative reconstruction technique. COMPARISON:  CT head 11/30/2013. FINDINGS: Brain: Patchy and confluent areas of decreased attenuation are noted throughout the deep and periventricular white matter of the cerebral hemispheres bilaterally, compatible with chronic microvascular ischemic disease. Chronic nonspecific punctate calcification along the left basal ganglia. No evidence of large-territorial acute infarction. No parenchymal hemorrhage. No mass lesion. No extra-axial collection. No mass effect or midline shift. No hydrocephalus. Basilar cisterns are patent. Vascular: No hyperdense vessel. Skull: No acute fracture or focal lesion. Bilateral temporomandibular joint degenerative changes. Sinuses/Orbits: Bilateral maxillary sinus mucosal thickening. Bilateral ethmoid sinus almost complete opacification. Left frontal and right sphenoid mucosal thickening. Paranasal  sinuses and mastoid air cells are clear. The orbits are unremarkable. Other: None. IMPRESSION: 1. No acute intracranial abnormality. 2. Sinus disease. Electronically Signed   By: Morgane  Naveau M.D.   On: 07/17/2023 01:01   DG Chest Portable 1 View Result Date: 07/16/2023 CLINICAL DATA:  Respiratory distress EXAM: PORTABLE CHEST 1 VIEW COMPARISON:  Chest x-ray 04/21/2023 FINDINGS: There is no focal lung infiltrate, pleural effusion or pneumothorax. The cardiomediastinal silhouette is within normal limits. Peripherally calcified breast implants are present bilaterally. No acute fractures are seen. IMPRESSION: No active disease. Electronically Signed   By: Greig Pique M.D.   On: 07/16/2023 23:00   US  THYROID  Result Date: 07/16/2023 CLINICAL DATA:  Thyroid  mass EXAM: THYROID  ULTRASOUND TECHNIQUE: Ultrasound examination of the thyroid  gland and adjacent soft tissues was performed. COMPARISON:  None Available. FINDINGS: Parenchymal Echotexture: Normal Isthmus: 2.2 cm Right lobe: 3.8 x 1.4 x 1.3 cm Left lobe: 3.4 x 1.2 x 1.5 cm _________________________________________________________ Estimated total number of nodules >/= 1 cm: 3 Number of spongiform nodules >/=  2 cm not described below (TR1): 0 Number of mixed cystic and solid nodules >/= 1.5 cm not described below (TR2): 0 _________________________________________________________ Nodule # 1: Location: Left; Superior Maximum size: 1.2 cm; Other 2 dimensions: 0.9 x 0.7 cm Composition: solid/almost completely solid (2) Echogenicity: isoechoic (1) Shape: not taller-than-wide (0) Margins: ill-defined (0) Echogenic foci: none (0) ACR TI-RADS total points: 3. ACR TI-RADS risk category: TR3 (3 points). ACR TI-RADS recommendations: Given size (<1.4 cm) and appearance, this nodule does NOT meet TI-RADS criteria for biopsy or dedicated follow-up. _________________________________________________________ Nodule # 2: Location: Left; Superior Maximum size: 1.7 cm; Other 2  dimensions: 1.1 x 0.7 cm Composition: solid/almost completely solid (2) Echogenicity: isoechoic (1) Shape: not taller-than-wide (0) Margins: ill-defined (0) Echogenic foci: punctate echogenic foci (3) ACR TI-RADS total points: 6. ACR TI-RADS risk category: TR4 (4-6 points). ACR TI-RADS recommendations: **Given size (>/= 1.5 cm) and appearance, fine needle aspiration of this moderately suspicious nodule should be considered based on TI-RADS criteria. _________________________________________________________ Nodule # 3: Location: Left; Inferior Maximum size: 1.0 cm; Other 2 dimensions: 0.5 x 0.5 cm Composition: solid/almost completely solid (2) Echogenicity: isoechoic (1) Shape: not taller-than-wide (0) Margins: ill-defined (0) Echogenic foci: punctate echogenic foci (3) ACR TI-RADS total points: 6. ACR TI-RADS risk category: TR4 (4-6 points). ACR TI-RADS recommendations: *Given size (>/= 1 - 1.4 cm) and appearance, a follow-up ultrasound in 1 year should be considered based on TI-RADS criteria. _________________________________________________________ IMPRESSION: 1. A TR 4 nodule is present in the upper pole the left  thyroid  measuring 1.7 cm. **Given size (>/= 1.5 cm) and appearance, fine needle aspiration of this moderately suspicious nodule should be considered based on TI-RADS criteria. 2. A second TR 4 nodule is present in the lower pole of the left thyroid  measuring 1.0 cm. *Given size (>/= 1 - 1.4 cm) and appearance, a follow-up ultrasound in 1 year should be considered based on TI-RADS criteria. The above is in keeping with the ACR TI-RADS recommendations - J Am Coll Radiol 2017;14:587-595. Electronically Signed   By: Maude Naegeli M.D.   On: 07/16/2023 11:47    Disposition Plan & Communication  Patient status: Inpatient  Admitted From: {From:23814} Planned disposition location: {PLAN; DISPOSITION:26386} Anticipated discharge date: *** pending ***  Family Communication: ***    Author: Marien LITTIE Piety, DO [facility] 07/17/2023, 7:45 AM   Available by Epic secure chat 7AM-7PM. If 7PM-7AM, please contact night-coverage.  [facility] contact information found on [email or web address].

## 2023-07-20 ENCOUNTER — Telehealth: Payer: Self-pay

## 2023-07-20 NOTE — Transitions of Care (Post Inpatient/ED Visit) (Signed)
   07/20/2023  Name: [name] MRN: [id number] DOB: [birth year]  Today's TOC FU Call Status: Today's TOC FU Call Status:: Unsuccessful Call (1st Attempt) Unsuccessful Call (1st Attempt) Date: 07/20/23  Attempted to reach the patient regarding the most recent Inpatient/ED visit.  Follow Up Plan: Additional outreach attempts will be made to reach the patient to complete the Transitions of Care (Post Inpatient/ED visit) call.   Medford Balboa, BSN, RN [place]  VBCI - [employer] [facility] RN Care Manager [phone]

## 2023-07-21 ENCOUNTER — Other Ambulatory Visit: Payer: Self-pay | Admitting: Student in an Organized Health Care Education/Training Program

## 2023-07-21 NOTE — Telephone Encounter (Signed)
 Received notification for refill albuterol  neb. It appears was sent in from hospital. Please confirm. If not sent in - ok to refill.

## 2023-07-22 ENCOUNTER — Other Ambulatory Visit: Payer: Self-pay

## 2023-07-22 ENCOUNTER — Other Ambulatory Visit: Payer: Self-pay | Admitting: Student in an Organized Health Care Education/Training Program

## 2023-07-22 ENCOUNTER — Telehealth: Payer: Self-pay

## 2023-07-22 MED ORDER — ALBUTEROL SULFATE (2.5 MG/3ML) 0.083% IN NEBU: 2.5000 mg | INHALATION_SOLUTION | RESPIRATORY_TRACT | 0 refills | Status: DC | PRN
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
## 2023-07-22 NOTE — Telephone Encounter (Signed)
 I could not see the dates for cardiology. When are her appts?  Need to confirm if needs to be seen prior to 08/20/23.

## 2023-07-22 NOTE — Telephone Encounter (Signed)
 Confirmed with patient that she does need. Sent to pharmacy.

## 2023-07-22 NOTE — Telephone Encounter (Signed)
 Plan of Treatment  Plan of Treatment - Upcoming Encounters Upcoming Encounters Date Type Department Care Team (Latest Contact Info) Description  07/28/2023 10:30 AM EDT Office Visit [facility]  [address]  [place], [place] [address]  [phone]  Florencio Cara Endow, MD  [address]  [place], [place] [address]  [phone] (Work)  [phone] [place], [place]  [address]  [place], [place] [address]  [phone] (Work)  [phone] [facility])  HOSPITAL F/U - [place] - [place] [facility]  11/11/2023 9:00 AM EDT Office Visit [facility]  [address]  [place], [place] [address]  [phone]  Theotis Lavelle BRAVO, MD  [address]  [place], [place] [address]  [phone] (Work)  [phone] (Fax)  r

## 2023-07-23 ENCOUNTER — Other Ambulatory Visit: Payer: Self-pay | Admitting: Internal Medicine

## 2023-07-23 DIAGNOSIS — E041 Nontoxic single thyroid nodule: Secondary | ICD-10-CM

## 2023-07-23 NOTE — Progress Notes (Signed)
Order placed for endocrinology referral.  

## 2023-07-28 DIAGNOSIS — I10 Essential (primary) hypertension: Secondary | ICD-10-CM | POA: Diagnosis not present

## 2023-07-28 DIAGNOSIS — J45909 Unspecified asthma, uncomplicated: Secondary | ICD-10-CM | POA: Diagnosis not present

## 2023-07-28 DIAGNOSIS — I480 Paroxysmal atrial fibrillation: Secondary | ICD-10-CM | POA: Diagnosis not present

## 2023-07-28 DIAGNOSIS — I5032 Chronic diastolic (congestive) heart failure: Secondary | ICD-10-CM | POA: Diagnosis not present

## 2023-08-04 ENCOUNTER — Other Ambulatory Visit: Payer: Self-pay | Admitting: Internal Medicine

## 2023-08-05 ENCOUNTER — Telehealth: Payer: Self-pay | Admitting: Internal Medicine

## 2023-08-05 ENCOUNTER — Other Ambulatory Visit: Payer: Self-pay | Admitting: Internal Medicine

## 2023-08-05 MED ORDER — ALBUTEROL SULFATE (2.5 MG/3ML) 0.083% IN NEBU: 2.5000 mg | INHALATION_SOLUTION | RESPIRATORY_TRACT | 0 refills | Status: DC | PRN

## 2023-08-05 NOTE — Telephone Encounter (Signed)
 Called pt and let her know that medication has been sent it. Also advised her that she need a hospital follow up, and she stated that she is busy with dr. appointments. She said she would see  Dr. Glendia on the 31 of July.
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
## 2023-08-12 DIAGNOSIS — H3581 Retinal edema: Secondary | ICD-10-CM | POA: Diagnosis not present

## 2023-08-12 DIAGNOSIS — H26491 Other secondary cataract, right eye: Secondary | ICD-10-CM | POA: Diagnosis not present

## 2023-08-12 DIAGNOSIS — Z961 Presence of intraocular lens: Secondary | ICD-10-CM | POA: Diagnosis not present

## 2023-08-12 DIAGNOSIS — H401133 Primary open-angle glaucoma, bilateral, severe stage: Secondary | ICD-10-CM | POA: Diagnosis not present

## 2023-08-12 DIAGNOSIS — H35372 Puckering of macula, left eye: Secondary | ICD-10-CM | POA: Diagnosis not present

## 2023-08-13 ENCOUNTER — Ambulatory Visit: Payer: Self-pay

## 2023-08-13 DIAGNOSIS — I10 Essential (primary) hypertension: Secondary | ICD-10-CM | POA: Diagnosis not present

## 2023-08-13 DIAGNOSIS — I480 Paroxysmal atrial fibrillation: Secondary | ICD-10-CM | POA: Diagnosis not present

## 2023-08-13 DIAGNOSIS — I5032 Chronic diastolic (congestive) heart failure: Secondary | ICD-10-CM | POA: Diagnosis not present

## 2023-08-13 DIAGNOSIS — J45909 Unspecified asthma, uncomplicated: Secondary | ICD-10-CM | POA: Diagnosis not present

## 2023-08-13 NOTE — Telephone Encounter (Signed)
 FYI Only or Action Required?: FYI only for provider.  Patient was last seen in primary care on 07/06/2023 by Glendia Shad, MD.  Called Nurse Triage reporting Hypotension.  Symptoms began today.  Interventions attempted: Other: N/A.  Symptoms are: stable.  Triage Disposition: Information or Advice Only Call  Patient/caregiver understands and will follow disposition?: Yes  **Home care advice/reasons to call back given**                             Copied from CRM #[id number]. Topic: Clinical - Red Word Triage >> Aug 13, 2023  5:42 PM Rea C wrote: Red Word that prompted transfer to Nurse Triage: Patient would like to know what would be a normal blood pressure for her. 138/40 most recent reading today. Not experiencing symptoms at the moment but patient is unsure. Reason for Disposition  Health information question, no triage required and triager able to answer question  Answer Assessment - Initial Assessment Questions 1. BLOOD PRESSURE: What is your blood pressure? Did you take at least two measurements 5 minutes apart?       2. ONSET: When did you take your blood pressure?     138/60 last reading   3. HOW: How did you take your blood pressure? (e.g., visiting nurse, automatic home BP monitor)     Patient has home B/P  4. HISTORY: Do you have a history of low blood pressure? What is your blood pressure normally?     No   5. MEDICINES: Are you taking any medicines for blood pressure? If Yes, ask: Have they been changed recently?     No    7. OTHER SYMPTOMS: Have you been sick recently? Have you had a recent injury?   No    Patient checks B/P often. She is aymptomatic  Protocols used: Blood Pressure - Low-A-AH, Information Only Call - No Triage-A-AH

## 2023-08-14 NOTE — Telephone Encounter (Signed)
Called patient to follow up. LMTCB.

## 2023-08-18 NOTE — Telephone Encounter (Signed)
 LM to follow up with patient,

## 2023-08-20 ENCOUNTER — Ambulatory Visit: Admitting: Internal Medicine

## 2023-08-20 ENCOUNTER — Other Ambulatory Visit: Payer: Self-pay | Admitting: Internal Medicine

## 2023-08-20 NOTE — Telephone Encounter (Signed)
 I have sent in refill, but please confirm she is doing ok.  Breathing ok.

## 2023-08-21 NOTE — Telephone Encounter (Signed)
 LM to follow up. Unable to reach patient.

## 2023-08-21 NOTE — Telephone Encounter (Signed)
 LM for pt

## 2023-09-08 DIAGNOSIS — I10 Essential (primary) hypertension: Secondary | ICD-10-CM | POA: Diagnosis not present

## 2023-09-08 DIAGNOSIS — J45909 Unspecified asthma, uncomplicated: Secondary | ICD-10-CM | POA: Diagnosis not present

## 2023-09-08 DIAGNOSIS — I5032 Chronic diastolic (congestive) heart failure: Secondary | ICD-10-CM | POA: Diagnosis not present

## 2023-09-08 DIAGNOSIS — I480 Paroxysmal atrial fibrillation: Secondary | ICD-10-CM | POA: Diagnosis not present

## 2023-09-28 ENCOUNTER — Other Ambulatory Visit: Payer: Self-pay | Admitting: Internal Medicine

## 2023-09-28 ENCOUNTER — Ambulatory Visit: Admitting: Internal Medicine

## 2023-09-28 ENCOUNTER — Ambulatory Visit (INDEPENDENT_AMBULATORY_CARE_PROVIDER_SITE_OTHER)

## 2023-09-28 VITALS — BP 138/76 | HR 86 | Resp 16 | Ht 61.0 in | Wt 108.2 lb

## 2023-09-28 DIAGNOSIS — I4891 Unspecified atrial fibrillation: Secondary | ICD-10-CM | POA: Diagnosis not present

## 2023-09-28 DIAGNOSIS — R739 Hyperglycemia, unspecified: Secondary | ICD-10-CM | POA: Diagnosis not present

## 2023-09-28 DIAGNOSIS — R059 Cough, unspecified: Secondary | ICD-10-CM | POA: Diagnosis not present

## 2023-09-28 DIAGNOSIS — R062 Wheezing: Secondary | ICD-10-CM | POA: Diagnosis not present

## 2023-09-28 DIAGNOSIS — E7800 Pure hypercholesterolemia, unspecified: Secondary | ICD-10-CM | POA: Diagnosis not present

## 2023-09-28 DIAGNOSIS — R0602 Shortness of breath: Secondary | ICD-10-CM

## 2023-09-28 DIAGNOSIS — E079 Disorder of thyroid, unspecified: Secondary | ICD-10-CM

## 2023-09-28 DIAGNOSIS — I10 Essential (primary) hypertension: Secondary | ICD-10-CM

## 2023-09-28 DIAGNOSIS — I700 Atherosclerosis of aorta: Secondary | ICD-10-CM | POA: Diagnosis not present

## 2023-09-28 LAB — CBC WITH DIFFERENTIAL/PLATELET
Basophils Absolute: 0.1 K/uL (ref 0.0–0.1)
Basophils Relative: 0.8 % (ref 0.0–3.0)
Eosinophils Absolute: 0.5 K/uL (ref 0.0–0.7)
Eosinophils Relative: 6.6 % — ABNORMAL HIGH (ref 0.0–5.0)
HCT: 38.8 % (ref 36.0–46.0)
Hemoglobin: 13.4 g/dL (ref 12.0–15.0)
Lymphocytes Relative: 23.4 % (ref 12.0–46.0)
Lymphs Abs: 1.7 K/uL (ref 0.7–4.0)
MCHC: 34.4 g/dL (ref 30.0–36.0)
MCV: 97.9 fl (ref 78.0–100.0)
Monocytes Absolute: 0.5 K/uL (ref 0.1–1.0)
Monocytes Relative: 7.1 % (ref 3.0–12.0)
Neutro Abs: 4.4 K/uL (ref 1.4–7.7)
Neutrophils Relative %: 62.1 % (ref 43.0–77.0)
Platelets: 310 K/uL (ref 150.0–400.0)
RBC: 3.97 Mil/uL (ref 3.87–5.11)
RDW: 13.6 % (ref 11.5–15.5)
WBC: 7.1 K/uL (ref 4.0–10.5)

## 2023-09-28 LAB — BASIC METABOLIC PANEL WITH GFR
BUN: 23 mg/dL (ref 6–23)
CO2: 29 meq/L (ref 19–32)
Calcium: 9.6 mg/dL (ref 8.4–10.5)
Chloride: 104 meq/L (ref 96–112)
Creatinine, Ser: 0.9 mg/dL (ref 0.40–1.20)
GFR: 57.24 mL/min — ABNORMAL LOW (ref >60.00)
Glucose, Bld: 99 mg/dL (ref 70–99)
Potassium: 5 meq/L (ref 3.5–5.1)
Sodium: 140 meq/L (ref 135–145)

## 2023-09-28 MED ORDER — APIXABAN 2.5 MG PO TABS: 2.5000 mg | ORAL_TABLET | Freq: Two times a day (BID) | ORAL | 3 refills | Status: AC

## 2023-09-28 NOTE — Progress Notes (Unsigned)
 Subjective:    [name], female    DOB: Oct 07, 1933, 89 y.o.   MRN: [id number]  Patient here for  Chief Complaint  Patient presents with   Medical Management of Chronic Issues    HPI Here for a scheduled follow up - follow up regarding chronic HFrEF, paroxysmal afib, hypertension, hypercholesterolemia and asthma. To review, she was admitted in 06/2023 - with respirator failure. Was placed on bi-pap in ER. Was given IV solumedrol, IV diuretic and NTG patch. Also felt had a significant asthma exacerbation. Prescribed daily inhaler and nebulizer. Has been followed I heart failure clinic since. Last evalauted in heart failure clinic 09/08/23 - note stated to continue losartan and start amlodipine  5mg  q day. She is not taking either one of these medications. Reports her blood pressures have been good at home with averages 126/66.    Past Medical History:  Diagnosis Date   Asthma    Cancer (HCC)    skin cancer nose   Chronic combined systolic and diastolic CHF (congestive heart failure) (HCC)    Complication of anesthesia    woke up during partial hysterectomy   Elevated blood pressure    GERD (gastroesophageal reflux disease)    occ-no meds   Headache    h/o    Heart murmur    asymptomatic   Past Surgical History:  Procedure Laterality Date   ABDOMINAL HYSTERECTOMY  1974   partial   BREAST SURGERY     IMPLANTS   CATARACT EXTRACTION W/PHACO Right 02/16/2018   Procedure: CATARACT EXTRACTION PHACO AND INTRAOCULAR LENS PLACEMENT (IOC) RIGHT;  Surgeon: Ferol Rogue, MD;  Location: [facility] OR[facility];  Service: Ophthalmology;  Laterality: Right;  US   01:10 CDE 11.02 Fluid pack lot # [id number]   CATARACT EXTRACTION W/PHACO Left 03/18/2018   Procedure: CATARACT EXTRACTION PHACO AND INTRAOCULAR LENS PLACEMENT (IOC) LEFT;  Surgeon: Ferol Rogue, MD;  Location: [facility];  Service: Ophthalmology;  Laterality: Left;  US  01:06.5 CDE 10.80 FLUID PACK LOT # [id number]   LUMBAR LAMINECTOMY/  DECOMPRESSION WITH MET-RX Bilateral 09/20/2018   Procedure: LUMBAR LAMINECTOMY/ DECOMPRESSION WITH MET-RX;  Surgeon: Bluford Standing, MD;  Location: [facility];  Service: Neurosurgery;  Laterality: Bilateral;   TONSILLECTOMY AND ADENOIDECTOMY  1944   Family History  Problem Relation Age of Onset   Stroke Mother    Hypertension Mother    Stroke Father    Hypertension Father    Diabetes Other    Social History   Socioeconomic History   Marital status: Married    Spouse name: Not on file   Number of children: Not on file   Years of education: Not on file   Highest education level: Not on file  Occupational History   Not on file  Tobacco Use   Smoking status: Never   Smokeless tobacco: Never  Vaping Use   Vaping status: Never Used  Substance and Sexual Activity   Alcohol use: No    Alcohol/week: 0.0 standard drinks of alcohol   Drug use: No   Sexual activity: Not on file  Other Topics Concern   Not on file  Social History Narrative   Not on file   Social Drivers of Health   Financial Resource Strain: Low Risk  (05/15/2023)   Received from [facility]   Overall Financial Resource Strain (CARDIA)    Difficulty of Paying Living Expenses: Not hard at all  Food Insecurity: No Food Insecurity (05/15/2023)   [facility]   Hunger Vital Sign    Within the past 12 months, you worried that your food would run out before you got the money to buy more.: Never true    Within the past 12 months, the food you bought just didn't last and you didn't have money to get more.: Never true  Transportation Needs: No Transportation Needs (05/15/2023)   Received from [facility] - Transportation    In the past 12 months, has lack of transportation kept you from medical appointments or from getting medications?: No    Lack of Transportation (Non-Medical): No  Physical Activity: Insufficiently Active (01/23/2022)   Exercise  Vital Sign    Days of Exercise per Week: 4 days    Minutes of Exercise per Session: 20 min  Stress: No Stress Concern Present (01/23/2022)   [employer] of Occupational [employer] - Occupational Stress Questionnaire    Feeling of Stress : Not at all  Social Connections: Unknown (01/23/2022)   Social Connection and Isolation Panel    Frequency of Communication with Friends and Family: Not on file    Frequency of Social Gatherings with Friends and Family: Not on file    Attends Religious Services: Not on file    Active Member of Clubs or Organizations: Not on file    Attends Engineer, structural: Not on file    Marital Status: Married     Review of Systems     Objective:     BP 138/76   Pulse 86   Resp 16   Ht 5' 1 (1.549 m)   Wt 108 lb 3.2 oz (49.1 kg)   SpO2 97%   BMI 20.44 kg/m  Wt Readings from Last 3 Encounters:  09/28/23 108 lb 3.2 oz (49.1 kg)  07/06/23 113 lb 6.4 oz (51.4 kg)  04/21/23 117 lb 15.1 oz (53.5 kg)    Physical Exam  {Perform Simple Foot Exam  Perform Detailed exam:1} {Insert foot Exam (Optional):30965}   Outpatient Encounter Medications as of 09/28/2023  Medication Sig   albuterol  (PROVENTIL ) (2.5 MG/3ML) 0.083% nebulizer solution TAKE 3 MLS BY NEBULIZATION EVERY 4 HOURSAS NEEDED FOR WHEEZING OR SHORTNESS OF BREATH   albuterol  (VENTOLIN  HFA) 108 (90 Base) MCG/ACT inhaler Inhale 2 puffs into the lungs every 6 (six) hours as needed for wheezing or shortness of breath.   apixaban  (ELIQUIS ) 2.5 MG TABS tablet Take 1 tablet (2.5 mg total) by mouth 2 (two) times daily.   Coenzyme Q10 (COQ10) 100 MG CAPS Take 100 mg by mouth daily.    dorzolamidel-timolol  (COSOPT ) 22.3-6.8 MG/ML SOLN ophthalmic solution Place 1 drop into both eyes 2 (two) times daily.   fluticasone  (FLOVENT  HFA) 44 MCG/ACT inhaler Inhale 2 puffs into the lungs 2 (two) times daily.   Multiple Vitamins-Minerals (MULTIVITAMIN WITH MINERALS) tablet Take 1 tablet by mouth daily.     ROCKLATAN 0.02-0.005 % SOLN SMARTSIG:1 Drop(s) In Eye(s) Every Evening   vitamin C (ASCORBIC ACID ) 500 MG tablet Take 500 mg by mouth daily.    No facility-administered encounter medications on file as of 09/28/2023.     Lab Results  Component Value Date   WBC 9.4 07/17/2023   HGB 13.7 07/17/2023   HCT 38.6 07/17/2023   PLT 244 07/17/2023   GLUCOSE 167 (H) 07/17/2023   CHOL 286 (H) 07/17/2023   TRIG 55 07/17/2023   HDL 73 07/17/2023   LDLDIRECT 192.0 02/13/2020   LDLCALC 202 (H) 07/17/2023  ALT 20 07/16/2023   AST 38 07/16/2023   NA 139 07/17/2023   K 3.3 (L) 07/17/2023   CL 105 07/17/2023   CREATININE 0.82 07/17/2023   BUN 28 (H) 07/17/2023   CO2 24 07/17/2023   TSH 1.520 07/06/2023   INR 1.1 01/17/2020   HGBA1C 5.4 07/06/2023    ECHOCARDIOGRAM COMPLETE Result Date: 07/17/2023    ECHOCARDIOGRAM REPORT   Patient Name:   Kathleen Wallace Date of Exam: 07/17/2023 Medical Rec #:  [id number]    Height:       61.0 in Accession #:    [id number]   Weight:       113.4 lb Date of Birth:  1933-11-22    BSA:          1.484 m Patient Age:    89 years     BP:           123/61 mmHg Patient Gender: F            HR:           101 bpm. Exam Location:  [facility] Procedure: 2D Echo, Cardiac Doppler, Color Doppler and Intracardiac            Opacification Agent (Both Spectral and Color Flow Doppler were            utilized during procedure). Indications:     CHF-Acute Systolic I50.21  History:         Patient has prior history of Echocardiogram examinations, most                  recent 01/18/2020.  Sonographer:     Ashley McNeely-Sloane Referring Phys:  [id number] XILIN NIU Diagnosing Phys: Cara JONETTA Lovelace MD IMPRESSIONS  1. Left ventricular ejection fraction, by estimation, is 60 to 65%. The left ventricle has normal function. The left ventricle has no regional wall motion abnormalities. The left ventricular internal cavity size was mildly dilated. There is mild concentric left ventricular hypertrophy. Left  ventricular diastolic parameters are consistent with Grade I diastolic dysfunction (impaired relaxation).  2. Right ventricular systolic function is normal. The right ventricular size is normal.  3. The mitral valve is normal in structure. Mild mitral valve regurgitation.  4. The aortic valve is normal in structure. Aortic valve regurgitation is not visualized. FINDINGS  Left Ventricle: Left ventricular ejection fraction, by estimation, is 60 to 65%. The left ventricle has normal function. The left ventricle has no regional wall motion abnormalities. Definity  contrast agent was given IV to delineate the left ventricular  endocardial borders. Strain was performed and the global longitudinal strain is indeterminate. Global longitudinal strain performed but not reported based on interpreter judgement due to suboptimal tracking. The left ventricular internal cavity size was  mildly dilated. There is mild concentric left ventricular hypertrophy. Abnormal (paradoxical) septal motion, consistent with left bundle branch block. Left ventricular diastolic parameters are consistent with Grade I diastolic dysfunction (impaired relaxation). Right Ventricle: The right ventricular size is normal. No increase in right ventricular wall thickness. Right ventricular systolic function is normal. Left Atrium: Left atrial size was normal in size. Right Atrium: Right atrial size was normal in size. Pericardium: There is no evidence of pericardial effusion. Mitral Valve: The mitral valve is normal in structure. Mild mitral valve regurgitation. Tricuspid Valve: The tricuspid valve is grossly normal. Tricuspid valve regurgitation is mild. Aortic Valve: The aortic valve is normal in structure. Aortic valve regurgitation is not visualized. Aortic valve mean gradient measures 3.0  mmHg. Aortic valve peak gradient measures 5.1 mmHg. Pulmonic Valve: The pulmonic valve was normal in structure. Pulmonic valve regurgitation is not visualized. Aorta:  The ascending aorta was not well visualized. IAS/Shunts: No atrial level shunt detected by color flow Doppler. Additional Comments: 3D was performed not requiring image post processing on an independent workstation and was indeterminate.   LV Volumes (MOD) LV vol d, MOD A2C: 50.8 ml Diastology LV vol d, MOD A4C: 57.8 ml LV e' medial:    3.37 cm/s LV vol s, MOD A2C: 11.2 ml LV E/e' medial:  16.9 LV vol s, MOD A4C: 25.3 ml LV e' lateral:   8.70 cm/s LV SV MOD A2C:     39.6 ml LV E/e' lateral: 6.6 LV SV MOD A4C:     57.8 ml LV SV MOD BP:      36.4 ml RIGHT VENTRICLE RV Basal diam:  2.80 cm RV Mid diam:    2.20 cm RV S prime:     9.68 cm/s TAPSE (M-mode): 1.2 cm LEFT ATRIUM             Index        RIGHT ATRIUM          Index LA Vol (A2C):   30.1 ml 20.28 ml/m  RA Area:     8.28 cm LA Vol (A4C):   9.9 ml  6.64 ml/m   RA Volume:   14.90 ml 10.04 ml/m LA Biplane Vol: 17.8 ml 11.99 ml/m  AORTIC VALVE AV Vmax:           113.00 cm/s AV Vmean:          77.000 cm/s AV VTI:            0.213 m AV Peak Grad:      5.1 mmHg AV Mean Grad:      3.0 mmHg LVOT Vmax:         110.00 cm/s LVOT Vmean:        72.300 cm/s LVOT VTI:          0.172 m LVOT/AV VTI ratio: 0.81 MITRAL VALVE MV Area (PHT): 4.60 cm     SHUNTS MV Decel Time: 165 msec     Systemic VTI: 0.17 m MV E velocity: 57.10 cm/s MV A velocity: 130.00 cm/s MV E/A ratio:  0.44 Dwayne D Callwood MD Electronically signed by Cara JONETTA Lovelace MD Signature Date/Time: 07/17/2023/4:44:21 PM    Final    CT HEAD WO CONTRAST ( ) Result Date: 07/17/2023 CLINICAL DATA:  Mental status change, unknown cause EXAM: CT HEAD WITHOUT CONTRAST TECHNIQUE: Contiguous axial images were obtained from the base of the skull through the vertex without intravenous contrast. RADIATION DOSE REDUCTION: This exam was performed according to the departmental dose-optimization program which includes automated exposure control, adjustment of the mA and/or kV according to patient size and/or use of  iterative reconstruction technique. COMPARISON:  CT head 11/30/2013. FINDINGS: Brain: Patchy and confluent areas of decreased attenuation are noted throughout the deep and periventricular white matter of the cerebral hemispheres bilaterally, compatible with chronic microvascular ischemic disease. Chronic nonspecific punctate calcification along the left basal ganglia. No evidence of large-territorial acute infarction. No parenchymal hemorrhage. No mass lesion. No extra-axial collection. No mass effect or midline shift. No hydrocephalus. Basilar cisterns are patent. Vascular: No hyperdense vessel. Skull: No acute fracture or focal lesion. Bilateral temporomandibular joint degenerative changes. Sinuses/Orbits: Bilateral maxillary sinus mucosal thickening. Bilateral ethmoid sinus almost complete opacification. Left frontal and right sphenoid  mucosal thickening. Paranasal sinuses and mastoid air cells are clear. The orbits are unremarkable. Other: None. IMPRESSION: 1. No acute intracranial abnormality. 2. Sinus disease. Electronically Signed   By: Morgane  Naveau M.D.   On: 07/17/2023 01:01       Assessment & Plan:  Essential hypertension     Allena Hamilton, MD

## 2023-09-29 ENCOUNTER — Ambulatory Visit: Payer: Self-pay | Admitting: Internal Medicine

## 2023-09-29 DIAGNOSIS — I10 Essential (primary) hypertension: Secondary | ICD-10-CM

## 2023-10-02 ENCOUNTER — Telehealth: Payer: Self-pay

## 2023-10-02 NOTE — Telephone Encounter (Signed)
 Called patient to let her know that MRI is not warranted. She is seeing pulmonary and cardiology next month, I am going to try to get her sooner appointments. Patient is not having any acute issues at this time just wants answers. Advised patient that I will follow up with her next week about getting sooner appts with the specialists. Pt gave verbal understanding.

## 2023-10-02 NOTE — Telephone Encounter (Signed)
 Copied from CRM #[id number]. Topic: Clinical - Request for Lab/Test Order >> Oct 02, 2023  1:04 PM DeAngela L wrote: Reason for CRM:  patient calling to ask if the doctor could request an MRI for her breathing concern she is experiencing, patient believes there could be a better view of her concern   Pt num [phone] (M)

## 2023-10-04 ENCOUNTER — Encounter: Payer: Self-pay | Admitting: Internal Medicine

## 2023-10-04 NOTE — Assessment & Plan Note (Signed)
 Was given neb treatment in office. Increased air movement. Given persistent cough and congestion - check cxr. Inaler/neb as directed.  Robitussin DM/steroid nasal spray as directed. Treat acid reflux. Discussed f/u with pulmonary - evaluation and question of need for PFTs.

## 2023-10-04 NOTE — Assessment & Plan Note (Signed)
 Discussed with her today regarding taking amlodipine  and losartan. She is not taking anything for her blood pressure. Reports blood  pressures at home average 120s/60s and desires not to take medication given blood pressure running within normal range at home. Discussed treatment. She declines.

## 2023-10-04 NOTE — Assessment & Plan Note (Signed)
 Denies any increased heart rate or palpitations.  Continues on eliquis .  Staying active. Has noticed some increase sob as outlined - associated with increased cough. Treat respiratory symptoms. Follow. EKG - SR with LBBB. Discussed f/u with cardiology.

## 2023-10-04 NOTE — Assessment & Plan Note (Signed)
 Thyroid  ultrasound - thyroid  nodule. Referred to Endocrinology for further evaluation.

## 2023-10-04 NOTE — Assessment & Plan Note (Signed)
Have discussed recommendation to start cholesterol medication.  She declines.  Low cholesterol diet and exercise.  Follow lipid panel.  

## 2023-10-04 NOTE — Assessment & Plan Note (Signed)
 Low-carb diet and exercise.  Follow met b and A1c.

## 2023-10-09 NOTE — Telephone Encounter (Signed)
 LM for patient   PLEASE GIVE THESE APPOINTMENT TIMES TO PATIENT WHEN SHE RETURNS CALL:   10/12/23 8:15 AM [facility] (DR CALLWOOD)  10/13/23 09:45 AM [facility] (DR THEOTIS)

## 2023-10-12 NOTE — Telephone Encounter (Signed)
 Called patient again to remind her of these appointments and she has declined both. She decided to keep them for October.

## 2023-10-13 ENCOUNTER — Other Ambulatory Visit (INDEPENDENT_AMBULATORY_CARE_PROVIDER_SITE_OTHER)

## 2023-10-13 DIAGNOSIS — I10 Essential (primary) hypertension: Secondary | ICD-10-CM | POA: Diagnosis not present

## 2023-10-13 LAB — BASIC METABOLIC PANEL WITH GFR
BUN: 30 mg/dL — ABNORMAL HIGH (ref 6–23)
CO2: 27 meq/L (ref 19–32)
Calcium: 9.3 mg/dL (ref 8.4–10.5)
Chloride: 105 meq/L (ref 96–112)
Creatinine, Ser: 0.91 mg/dL (ref 0.40–1.20)
GFR: 56.47 mL/min — ABNORMAL LOW (ref >60.00)
Glucose, Bld: 110 mg/dL — ABNORMAL HIGH (ref 70–99)
Potassium: 4.7 meq/L (ref 3.5–5.1)
Sodium: 139 meq/L (ref 135–145)

## 2023-10-14 ENCOUNTER — Ambulatory Visit: Payer: Self-pay | Admitting: Internal Medicine

## 2023-10-15 NOTE — Telephone Encounter (Signed)
 Copied from CRM [id number]. Topic: Clinical - Lab/Test Results >> Oct 15, 2023 10:21 AM Rea BROCKS wrote: Reason for CRM: Patient called back. Relayed lab results. Patient is wondering if she needs to keep taking the breathing treatments? She said she is down to one time, doing it at night before going to bed. She is considering doing a trial run and not taking a breathing treatment at all. Patient stated she is not feeling the pressure in her chest anymore, so she wants to try one night and not do it. Patient would like a call back for advice.   Patient would also like to know if she needs to get an MRI. She stated she would feel more comfortable if everything can be checked out. Just to cover all bases.   [phone] BENNIE)

## 2023-11-05 ENCOUNTER — Encounter: Payer: Self-pay | Admitting: Pharmacist

## 2023-11-05 NOTE — Progress Notes (Signed)
 Pharmacy Quality Measure Review  This patient is appearing on a report for being at risk of failing the adherence measure for hypertension (ACEi/ARB) medications this calendar year.   Medication: losartan 50 mg Last fill date: 08/13/23 for 30 day supply  Medication is no longer prescribed.BP well-controlled. No further action at this time.

## 2023-11-09 ENCOUNTER — Ambulatory Visit: Admitting: Internal Medicine

## 2023-11-09 ENCOUNTER — Other Ambulatory Visit: Payer: Self-pay | Admitting: Internal Medicine

## 2023-11-10 ENCOUNTER — Encounter: Payer: Self-pay | Admitting: Internal Medicine

## 2023-11-10 ENCOUNTER — Ambulatory Visit: Admitting: Internal Medicine

## 2023-11-10 VITALS — BP 136/60 | HR 87 | Ht 61.0 in | Wt 108.2 lb

## 2023-11-10 DIAGNOSIS — I10 Essential (primary) hypertension: Secondary | ICD-10-CM

## 2023-11-10 DIAGNOSIS — I4891 Unspecified atrial fibrillation: Secondary | ICD-10-CM | POA: Diagnosis not present

## 2023-11-10 DIAGNOSIS — L989 Disorder of the skin and subcutaneous tissue, unspecified: Secondary | ICD-10-CM

## 2023-11-10 DIAGNOSIS — R739 Hyperglycemia, unspecified: Secondary | ICD-10-CM

## 2023-11-10 DIAGNOSIS — E7800 Pure hypercholesterolemia, unspecified: Secondary | ICD-10-CM | POA: Diagnosis not present

## 2023-11-10 DIAGNOSIS — R0602 Shortness of breath: Secondary | ICD-10-CM | POA: Diagnosis not present

## 2023-11-10 DIAGNOSIS — R059 Cough, unspecified: Secondary | ICD-10-CM

## 2023-11-10 DIAGNOSIS — E079 Disorder of thyroid, unspecified: Secondary | ICD-10-CM | POA: Diagnosis not present

## 2023-11-10 DIAGNOSIS — R0989 Other specified symptoms and signs involving the circulatory and respiratory systems: Secondary | ICD-10-CM | POA: Diagnosis not present

## 2023-11-10 LAB — BASIC METABOLIC PANEL WITH GFR
BUN: 27 mg/dL — ABNORMAL HIGH (ref 6–23)
CO2: 28 meq/L (ref 19–32)
Calcium: 9.5 mg/dL (ref 8.4–10.5)
Chloride: 103 meq/L (ref 96–112)
Creatinine, Ser: 0.74 mg/dL (ref 0.40–1.20)
GFR: 72.33 mL/min (ref >60.00)
Glucose, Bld: 96 mg/dL (ref 70–99)
Potassium: 5.1 meq/L (ref 3.5–5.1)
Sodium: 138 meq/L (ref 135–145)

## 2023-11-10 LAB — LIPID PANEL
Cholesterol: 279 mg/dL — ABNORMAL HIGH (ref 0–200)
HDL: 63.3 mg/dL (ref >39.00)
LDL Cholesterol: 188 mg/dL — ABNORMAL HIGH (ref 0–99)
NonHDL: 215.24
Total CHOL/HDL Ratio: 4
Triglycerides: 138 mg/dL (ref 0.0–149.0)
VLDL: 27.6 mg/dL (ref 0.0–40.0)

## 2023-11-10 LAB — CBC WITH DIFFERENTIAL/PLATELET
Basophils Absolute: 0.4 K/uL — ABNORMAL HIGH (ref 0.0–0.1)
Basophils Relative: 4.4 % — ABNORMAL HIGH (ref 0.0–3.0)
Eosinophils Absolute: 0.5 K/uL (ref 0.0–0.7)
Eosinophils Relative: 6.8 % — ABNORMAL HIGH (ref 0.0–5.0)
HCT: 37.1 % (ref 36.0–46.0)
Hemoglobin: 12.5 g/dL (ref 12.0–15.0)
Lymphocytes Relative: 27.6 % (ref 12.0–46.0)
Lymphs Abs: 2.2 K/uL (ref 0.7–4.0)
MCHC: 33.7 g/dL (ref 30.0–36.0)
MCV: 95.2 fl (ref 78.0–100.0)
Monocytes Absolute: 0.6 K/uL (ref 0.1–1.0)
Monocytes Relative: 7 % (ref 3.0–12.0)
Neutro Abs: 4.3 K/uL (ref 1.4–7.7)
Neutrophils Relative %: 54.2 % (ref 43.0–77.0)
Platelets: 323 K/uL (ref 150.0–400.0)
RBC: 3.9 Mil/uL (ref 3.87–5.11)
RDW: 13.6 % (ref 11.5–15.5)
WBC: 8 K/uL (ref 4.0–10.5)

## 2023-11-10 LAB — HEPATIC FUNCTION PANEL
ALT: 12 U/L (ref 0–35)
AST: 14 U/L (ref 0–37)
Albumin: 4.5 g/dL (ref 3.5–5.2)
Alkaline Phosphatase: 58 U/L (ref 39–117)
Bilirubin, Direct: 0.1 mg/dL (ref 0.0–0.3)
Total Bilirubin: 0.4 mg/dL (ref 0.2–1.2)
Total Protein: 6.4 g/dL (ref 6.0–8.3)

## 2023-11-10 MED ORDER — PREDNISONE 10 MG PO TABS: ORAL_TABLET | ORAL | 0 refills | Status: AC

## 2023-11-10 NOTE — Patient Instructions (Signed)
 Saline nasal spray - flush nose 1-2x/day  Nasacort nasal spray - 2 sprays each nostril one time per day.

## 2023-11-10 NOTE — Progress Notes (Signed)
 Subjective:    [name], female    DOB: 03-21-1933, 89 y.o.   MRN: [id number]  Patient here for  Chief Complaint  Patient presents with   Medical Management of Chronic Issues    6 week follow up     HPI Here for a scheduled follow up - follow up regarding chronic HFrEF, paroxysmal afib, hypertension, hypercholesterolemia and asthma. To review, she was admitted in 06/2023 - with respiratory failure. Was placed on bi-pap in ER. Was given IV solumedrol, IV diuretic and NTG patch. Also felt had a significant asthma exacerbation. Prescribed daily inhaler and nebulizer. Has been followed in heart failure clinic since. Last evalauted in heart failure clinic 09/08/23 - note stated to continue losartan and start amlodipine  5mg  q day.. she has not been taking these medications. Reports blood pressures are well controlled at home. Desires not to take. last visit, reported increased cough and congestion. Continued her nebs and robitussin DM. CXR - no acute cardiopulmonary abnormality. Had discussed last visit, further cardiac and pulmonary w/up. She canceled her appts. Does have f/u with pulmonary tomorrow. Reports increased cough and congestion today. No chest pain reported. No fever. No acid reflux reported. Is concerned regarding - back lesion and crusty lesions on her head.    Past Medical History:  Diagnosis Date   Asthma    Cancer (HCC)    skin cancer nose   Chronic combined systolic and diastolic CHF (congestive heart failure) (HCC)    Complication of anesthesia    woke up during partial hysterectomy   Elevated blood pressure    GERD (gastroesophageal reflux disease)    occ-no meds   Headache    h/o    Heart murmur    asymptomatic   Past Surgical History:  Procedure Laterality Date   ABDOMINAL HYSTERECTOMY  1974   partial   BREAST SURGERY     IMPLANTS   CATARACT EXTRACTION W/PHACO Right 02/16/2018   Procedure: CATARACT EXTRACTION PHACO AND INTRAOCULAR LENS PLACEMENT  (IOC) RIGHT;  Surgeon: Ferol Rogue, MD;  Location: [facility] ORS;  Service: Ophthalmology;  Laterality: Right;  US   01:10 CDE 11.02 Fluid pack lot # [id number]   CATARACT EXTRACTION W/PHACO Left 03/18/2018   Procedure: CATARACT EXTRACTION PHACO AND INTRAOCULAR LENS PLACEMENT (IOC) LEFT;  Surgeon: Ferol Rogue, MD;  Location: [facility] OR[facility];  Service: Ophthalmology;  Laterality: Left;  US  01:06.5 CDE 10.80 FLUID PACK LOT # [id number]   LUMBAR LAMINECTOMY/ DECOMPRESSION WITH MET-RX Bilateral 09/20/2018   Procedure: LUMBAR LAMINECTOMY/ DECOMPRESSION WITH MET-RX;  Surgeon: Bluford Standing, MD;  Location: [facility] ORS;  Service: Neurosurgery;  Laterality: Bilateral;   TONSILLECTOMY AND ADENOIDECTOMY  1944   Family History  Problem Relation Age of Onset   Stroke Mother    Hypertension Mother    Stroke Father    Hypertension Father    Diabetes Other    Social History   Socioeconomic History   Marital status: Married    Spouse name: Not on file   Number of children: Not on file   Years of education: Not on file   Highest education level: Not on file  Occupational History   Not on file  Tobacco Use   Smoking status: Never   Smokeless tobacco: Never  Vaping Use   Vaping status: Never Used  Substance and Sexual Activity   Alcohol use: No    Alcohol/week: 0.0 standard drinks of alcohol   Drug use: No   Sexual activity: Not on file  Other Topics Concern   Not on file  Social History Narrative   Not on file   Social Drivers of Health   Financial Resource Strain: Low Risk  (05/15/2023)   Received from [facility]   Overall Financial Resource Strain (CARDIA)    Difficulty of Paying Living Expenses: Not hard at all  Food Insecurity: No Food Insecurity (05/15/2023)   Received from [facility]   Hunger Vital Sign    Within the past 12 months, you worried that your food would run out before you got the money to buy more.: Never true    Within the past 12 months, the  food you bought just didn't last and you didn't have money to get more.: Never true  Transportation Needs: No Transportation Needs (05/15/2023)   Received from [facility] - Transportation    In the past 12 months, has lack of transportation kept you from medical appointments or from getting medications?: No    Lack of Transportation (Non-Medical): No  Physical Activity: Insufficiently Active (01/23/2022)   Exercise Vital Sign    Days of Exercise per Week: 4 days    Minutes of Exercise per Session: 20 min  Stress: No Stress Concern Present (01/23/2022)   [employer] - Occupational Stress Questionnaire    Feeling of Stress : Not at all  Social Connections: Unknown (01/23/2022)   Social Connection and Isolation Panel    Frequency of Communication with Friends and Family: Not on file    Frequency of Social Gatherings with Friends and Family: Not on file    Attends Religious Services: Not on file    Active Member of Clubs or Organizations: Not on file    Attends Banker Meetings: Not on file    Marital Status: Married     Review of Systems  Constitutional:  Negative for appetite change and unexpected weight change.  HENT:  Negative for sinus pressure and sore throat.   Respiratory:  Positive for cough. Negative for chest tightness and shortness of breath.   Cardiovascular:  Negative for chest pain, palpitations and leg swelling.  Gastrointestinal:  Negative for abdominal pain, diarrhea, nausea and vomiting.  Genitourinary:  Negative for difficulty urinating and dysuria.  Musculoskeletal:  Negative for joint swelling and myalgias.  Skin:  Negative for color change and rash.  Neurological:  Negative for dizziness and headaches.  Psychiatric/Behavioral:  Negative for agitation and dysphoric mood.        Objective:     BP 136/60   Pulse 87   Ht 5' 1 (1.549 m)   Wt 108 lb 3.2 oz (49.1 kg)   SpO2 95%   BMI 20.44 kg/m   Wt Readings from Last 3 Encounters:  11/10/23 108 lb 3.2 oz (49.1 kg)  09/28/23 108 lb 3.2 oz (49.1 kg)  07/06/23 113 lb 6.4 oz (51.4 kg)    Physical Exam Vitals reviewed.  Constitutional:      General: She is not in acute distress.    Appearance: Normal appearance.  HENT:     Head: Normocephalic and atraumatic.     Right Ear: External ear normal.     Left Ear: External ear normal.  Eyes:     General: No scleral icterus.       Right eye: No discharge.        Left eye: No discharge.     Conjunctiva/sclera: Conjunctivae normal.  Neck:  Thyroid : No thyromegaly.  Cardiovascular:     Rate and Rhythm: Normal rate and regular rhythm.  Pulmonary:     Effort: No respiratory distress.     Breath sounds: Normal breath sounds. No wheezing.  Abdominal:     General: Bowel sounds are normal.     Palpations: Abdomen is soft.     Tenderness: There is no abdominal tenderness.  Musculoskeletal:        General: No swelling or tenderness.     Cervical back: Neck supple. No tenderness.  Lymphadenopathy:     Cervical: No cervical adenopathy.  Skin:    Findings: No erythema or rash.  Neurological:     Mental Status: She is alert.  Psychiatric:        Mood and Affect: Mood normal.        Behavior: Behavior normal.         Outpatient Encounter Medications as of 11/10/2023  Medication Sig   albuterol  (PROVENTIL ) (2.5 MG/3ML) 0.083% nebulizer solution TAKE 3 MLS BY NEBULIZATION EVERY 4 HOURSAS NEEDED FOR WHEEZING OR SHORTNESS OF BREATH   albuterol  (VENTOLIN  HFA) 108 (90 Base) MCG/ACT inhaler Inhale 2 puffs into the lungs every 6 (six) hours as needed for wheezing or shortness of breath.   apixaban  (ELIQUIS ) 2.5 MG TABS tablet Take 1 tablet (2.5 mg total) by mouth 2 (two) times daily.   Coenzyme Q10 (COQ10) 100 MG CAPS Take 100 mg by mouth daily.    dorzolamidel-timolol  (COSOPT ) 22.3-6.8 MG/ML SOLN ophthalmic solution Place 1 drop into both eyes 2 (two) times daily.   fluticasone   (FLOVENT  HFA) 44 MCG/ACT inhaler Inhale 2 puffs into the lungs 2 (two) times daily.   Multiple Vitamins-Minerals (MULTIVITAMIN WITH MINERALS) tablet Take 1 tablet by mouth daily.    predniSONE  (DELTASONE ) 10 MG tablet Take 4 tablets x 1 day and then decrease by 1/2 tablet per day until down to zero mg.   ROCKLATAN 0.02-0.005 % SOLN SMARTSIG:1 Drop(s) In Eye(s) Every Evening   vitamin C (ASCORBIC ACID ) 500 MG tablet Take 500 mg by mouth daily.    [DISCONTINUED] albuterol  (PROVENTIL ) (2.5 MG/3ML) 0.083% nebulizer solution TAKE 3 MLS BY NEBULIZATION EVERY 4 HOURSAS NEEDED FOR WHEEZING OR SHORTNESS OF BREATH   No facility-administered encounter medications on file as of 11/10/2023.     Lab Results  Component Value Date   WBC 8.0 11/10/2023   HGB 12.5 11/10/2023   HCT 37.1 11/10/2023   PLT 323.0 11/10/2023   GLUCOSE 96 11/10/2023   CHOL 279 (H) 11/10/2023   TRIG 138.0 11/10/2023   HDL 63.30 11/10/2023   LDLDIRECT 192.0 02/13/2020   LDLCALC 188 (H) 11/10/2023   ALT 12 11/10/2023   AST 14 11/10/2023   NA 138 11/10/2023   K 5.1 11/10/2023   CL 103 11/10/2023   CREATININE 0.74 11/10/2023   BUN 27 (H) 11/10/2023   CO2 28 11/10/2023   TSH 1.520 07/06/2023   INR 1.1 01/17/2020   HGBA1C 5.4 07/06/2023    ECHOCARDIOGRAM COMPLETE Result Date: 07/17/2023    ECHOCARDIOGRAM REPORT   Patient Name:   TAMIKIA CHOWNING Date of Exam: 07/17/2023 Medical Rec #:  [id number]    Height:       61.0 in Accession #:    [id number]   Weight:       113.4 lb Date of Birth:  [birth year]    BSA:          1.484 m Patient Age:    29 years  BP:           123/61 mmHg Patient Gender: F            HR:           101 bpm. Exam Location:  [facility] Procedure: 2D Echo, Cardiac Doppler, Color Doppler and Intracardiac            Opacification Agent (Both Spectral and Color Flow Doppler were            utilized during procedure). Indications:     CHF-Acute Systolic I50.21  History:         Patient has prior history of Echocardiogram  examinations, most                  recent 01/18/2020.  Sonographer:     Ashley McNeely-Sloane Referring Phys:  [id number] XILIN NIU Diagnosing Phys: Cara JONETTA Lovelace MD IMPRESSIONS  1. Left ventricular ejection fraction, by estimation, is 60 to 65%. The left ventricle has normal function. The left ventricle has no regional wall motion abnormalities. The left ventricular internal cavity size was mildly dilated. There is mild concentric left ventricular hypertrophy. Left ventricular diastolic parameters are consistent with Grade I diastolic dysfunction (impaired relaxation).  2. Right ventricular systolic function is normal. The right ventricular size is normal.  3. The mitral valve is normal in structure. Mild mitral valve regurgitation.  4. The aortic valve is normal in structure. Aortic valve regurgitation is not visualized. FINDINGS  Left Ventricle: Left ventricular ejection fraction, by estimation, is 60 to 65%. The left ventricle has normal function. The left ventricle has no regional wall motion abnormalities. Definity  contrast agent was given IV to delineate the left ventricular  endocardial borders. Strain was performed and the global longitudinal strain is indeterminate. Global longitudinal strain performed but not reported based on interpreter judgement due to suboptimal tracking. The left ventricular internal cavity size was  mildly dilated. There is mild concentric left ventricular hypertrophy. Abnormal (paradoxical) septal motion, consistent with left bundle branch block. Left ventricular diastolic parameters are consistent with Grade I diastolic dysfunction (impaired relaxation). Right Ventricle: The right ventricular size is normal. No increase in right ventricular wall thickness. Right ventricular systolic function is normal. Left Atrium: Left atrial size was normal in size. Right Atrium: Right atrial size was normal in size. Pericardium: There is no evidence of pericardial effusion. Mitral Valve: The  mitral valve is normal in structure. Mild mitral valve regurgitation. Tricuspid Valve: The tricuspid valve is grossly normal. Tricuspid valve regurgitation is mild. Aortic Valve: The aortic valve is normal in structure. Aortic valve regurgitation is not visualized. Aortic valve mean gradient measures 3.0 mmHg. Aortic valve peak gradient measures 5.1 mmHg. Pulmonic Valve: The pulmonic valve was normal in structure. Pulmonic valve regurgitation is not visualized. Aorta: The ascending aorta was not well visualized. IAS/Shunts: No atrial level shunt detected by color flow Doppler. Additional Comments: 3D was performed not requiring image post processing on an independent workstation and was indeterminate.   LV Volumes (MOD) LV vol d, MOD A2C: 50.8 ml Diastology LV vol d, MOD A4C: 57.8 ml LV e' medial:    3.37 cm/s LV vol s, MOD A2C: 11.2 ml LV E/e' medial:  16.9 LV vol s, MOD A4C: 25.3 ml LV e' lateral:   8.70 cm/s LV SV MOD A2C:     39.6 ml LV E/e' lateral: 6.6 LV SV MOD A4C:     57.8 ml LV SV MOD BP:  36.4 ml RIGHT VENTRICLE RV Basal diam:  2.80 cm RV Mid diam:    2.20 cm RV S prime:     9.68 cm/s TAPSE (M-mode): 1.2 cm LEFT ATRIUM             Index        RIGHT ATRIUM          Index LA Vol (A2C):   30.1 ml 20.28 ml/m  RA Area:     8.28 cm LA Vol (A4C):   9.9 ml  6.64 ml/m   RA Volume:   14.90 ml 10.04 ml/m LA Biplane Vol: 17.8 ml 11.99 ml/m  AORTIC VALVE AV Vmax:           113.00 cm/s AV Vmean:          77.000 cm/s AV VTI:            0.213 m AV Peak Grad:      5.1 mmHg AV Mean Grad:      3.0 mmHg LVOT Vmax:         110.00 cm/s LVOT Vmean:        72.300 cm/s LVOT VTI:          0.172 m LVOT/AV VTI ratio: 0.81 MITRAL VALVE MV Area (PHT): 4.60 cm     SHUNTS MV Decel Time: 165 msec     Systemic VTI: 0.17 m MV E velocity: 57.10 cm/s MV A velocity: 130.00 cm/s MV E/A ratio:  0.44 Dwayne D Callwood MD Electronically signed by Cara JONETTA Lovelace MD Signature Date/Time: 07/17/2023/4:44:21 PM    Final    CT HEAD WO  CONTRAST ( ) Result Date: 07/17/2023 CLINICAL DATA:  Mental status change, unknown cause EXAM: CT HEAD WITHOUT CONTRAST TECHNIQUE: Contiguous axial images were obtained from the base of the skull through the vertex without intravenous contrast. RADIATION DOSE REDUCTION: This exam was performed according to the departmental dose-optimization program which includes automated exposure control, adjustment of the mA and/or kV according to patient size and/or use of iterative reconstruction technique. COMPARISON:  CT head 11/30/2013. FINDINGS: Brain: Patchy and confluent areas of decreased attenuation are noted throughout the deep and periventricular white matter of the cerebral hemispheres bilaterally, compatible with chronic microvascular ischemic disease. Chronic nonspecific punctate calcification along the left basal ganglia. No evidence of large-territorial acute infarction. No parenchymal hemorrhage. No mass lesion. No extra-axial collection. No mass effect or midline shift. No hydrocephalus. Basilar cisterns are patent. Vascular: No hyperdense vessel. Skull: No acute fracture or focal lesion. Bilateral temporomandibular joint degenerative changes. Sinuses/Orbits: Bilateral maxillary sinus mucosal thickening. Bilateral ethmoid sinus almost complete opacification. Left frontal and right sphenoid mucosal thickening. Paranasal sinuses and mastoid air cells are clear. The orbits are unremarkable. Other: None. IMPRESSION: 1. No acute intracranial abnormality. 2. Sinus disease. Electronically Signed   By: Morgane  Naveau M.D.   On: 07/17/2023 01:01       Assessment & Plan:  SOB (shortness of breath) Assessment & Plan: Breathing overall relatively stable. Some increased cough and congestion today. Has noticed some increased sob with increased exertion as outlined in previous note. Treat current flare - prednisone  taper as directed. Continue neb. Robitussin DM. Keep appt with pulmonary tomorrow. Discussed the need  to reschedule her cardiac appt to discuss further w/up and evaluation.   Orders: -     Ambulatory referral to Cardiology  Essential hypertension Assessment & Plan: Discussed with her regarding taking amlodipine  and losartan. She is not taking anything for her blood pressure.  Reports blood  pressures at home average 120s/60s and desires not to take medication given blood pressure running within normal range at home. Discussed the need for better control of her blood pressure. She declines further medication. Follow.   Orders: -     Basic metabolic panel with GFR  Hypercholesterolemia Assessment & Plan: Have discussed recommendation to start cholesterol medication.  She declines.  Low cholesterol diet and exercise.  Follow lipid panel.   Orders: -     CBC with Differential/Platelet -     Hepatic function panel -     Lipid panel  Thyroid  mass Assessment & Plan: Thyroid  ultrasound - thyroid  nodule. Referred to Endocrinology for further evaluation.    Skin lesion Assessment & Plan: Skin lesion of back and crusted lesions - scalp. Request referral to dermatology.   Orders: -     Ambulatory referral to Dermatology  Right carotid bruit Assessment & Plan: Noticed right carotid bruit.  Has never had carotid ultrasound.  Desires no f/u scanning     Hyperglycemia Assessment & Plan: Low carb diet and exercise. Follow met b and A1c.    Atrial fibrillation, unspecified type [facility]) Assessment & Plan: Denies any increased heart rate or palpitations.  Continues on eliquis .  Staying active. Has noticed some increase sob as outlined - associated with increased cough. Treat respiratory symptoms. Discussed rescheduling f/u with cardiology.    Cough, unspecified type  Other orders -     predniSONE ; Take 4 tablets x 1 day and then decrease by 1/2 tablet per day until down to zero mg.  Dispense: 18 tablet; Refill: 0     Allena Hamilton, MD

## 2023-11-11 ENCOUNTER — Ambulatory Visit: Payer: Self-pay | Admitting: Internal Medicine

## 2023-11-11 DIAGNOSIS — Z2821 Immunization not carried out because of patient refusal: Secondary | ICD-10-CM | POA: Diagnosis not present

## 2023-11-11 DIAGNOSIS — J4521 Mild intermittent asthma with (acute) exacerbation: Secondary | ICD-10-CM | POA: Diagnosis not present

## 2023-11-11 DIAGNOSIS — J301 Allergic rhinitis due to pollen: Secondary | ICD-10-CM | POA: Diagnosis not present

## 2023-11-11 NOTE — Telephone Encounter (Signed)
 Copied from CRM [id number]. Topic: Clinical - Lab/Test Results >> Nov 11, 2023 12:06 PM Dedra B wrote: Reason for CRM: Pt returning call for [facility] regarding labs and would like a call back.

## 2023-11-12 NOTE — Telephone Encounter (Unsigned)
 Copied from CRM [id number]. Topic: General - Other >> Nov 12, 2023  2:13 PM Franky GRADE wrote: Reason for CRM: Patient is returning a call she received from Nakoma Gotwalt, I advised patient of Dr.Scott's message and she is in favor to seeing the cardiologist. She also wanted to advise that the predniSONE  (DELTASONE ) 10 MG tablet [[id number]] is helping her with the symptoms she was experiencing.

## 2023-11-15 ENCOUNTER — Encounter: Payer: Self-pay | Admitting: Internal Medicine

## 2023-11-15 DIAGNOSIS — R059 Cough, unspecified: Secondary | ICD-10-CM | POA: Insufficient documentation

## 2023-11-15 NOTE — Assessment & Plan Note (Signed)
 Discussed with her regarding taking amlodipine  and losartan. She is not taking anything for her blood pressure. Reports blood  pressures at home average 120s/60s and desires not to take medication given blood pressure running within normal range at home. Discussed the need for better control of her blood pressure. She declines further medication. Follow.

## 2023-11-15 NOTE — Assessment & Plan Note (Signed)
 Low-carb diet and exercise.  Follow met b and A1c.

## 2023-11-15 NOTE — Assessment & Plan Note (Signed)
 Denies any increased heart rate or palpitations.  Continues on eliquis .  Staying active. Has noticed some increase sob as outlined - associated with increased cough. Treat respiratory symptoms. Discussed rescheduling f/u with cardiology.

## 2023-11-15 NOTE — Assessment & Plan Note (Signed)
Have discussed recommendation to start cholesterol medication.  She declines.  Low cholesterol diet and exercise.  Follow lipid panel.  

## 2023-11-15 NOTE — Assessment & Plan Note (Addendum)
 Breathing overall relatively stable. Some increased cough and congestion today. Has noticed some increased sob with increased exertion as outlined in previous note. Treat current flare - prednisone  taper as directed. Continue neb. Robitussin DM. Keep appt with pulmonary tomorrow. Discussed the need to reschedule her cardiac appt to discuss further w/up and evaluation.

## 2023-11-15 NOTE — Assessment & Plan Note (Signed)
 Thyroid  ultrasound - thyroid  nodule. Referred to Endocrinology for further evaluation.

## 2023-11-15 NOTE — Assessment & Plan Note (Signed)
 Skin lesion of back and crusted lesions - scalp. Request referral to dermatology.

## 2023-11-15 NOTE — Assessment & Plan Note (Signed)
 Noticed right carotid bruit.  Has never had carotid ultrasound.  Desires no f/u scanning

## 2023-12-01 ENCOUNTER — Ambulatory Visit

## 2023-12-01 DIAGNOSIS — Z85828 Personal history of other malignant neoplasm of skin: Secondary | ICD-10-CM

## 2023-12-01 DIAGNOSIS — W908XXA Exposure to other nonionizing radiation, initial encounter: Secondary | ICD-10-CM

## 2023-12-01 DIAGNOSIS — D229 Melanocytic nevi, unspecified: Secondary | ICD-10-CM

## 2023-12-01 DIAGNOSIS — C4492 Squamous cell carcinoma of skin, unspecified: Secondary | ICD-10-CM

## 2023-12-01 DIAGNOSIS — L570 Actinic keratosis: Secondary | ICD-10-CM

## 2023-12-01 DIAGNOSIS — D492 Neoplasm of unspecified behavior of bone, soft tissue, and skin: Secondary | ICD-10-CM

## 2023-12-01 DIAGNOSIS — L820 Inflamed seborrheic keratosis: Secondary | ICD-10-CM | POA: Diagnosis not present

## 2023-12-01 DIAGNOSIS — D0439 Carcinoma in situ of skin of other parts of face: Secondary | ICD-10-CM | POA: Diagnosis not present

## 2023-12-01 DIAGNOSIS — L578 Other skin changes due to chronic exposure to nonionizing radiation: Secondary | ICD-10-CM

## 2023-12-01 DIAGNOSIS — L814 Other melanin hyperpigmentation: Secondary | ICD-10-CM | POA: Diagnosis not present

## 2023-12-01 DIAGNOSIS — B009 Herpesviral infection, unspecified: Secondary | ICD-10-CM | POA: Diagnosis not present

## 2023-12-01 DIAGNOSIS — R238 Other skin changes: Secondary | ICD-10-CM | POA: Diagnosis not present

## 2023-12-01 DIAGNOSIS — D1801 Hemangioma of skin and subcutaneous tissue: Secondary | ICD-10-CM

## 2023-12-01 DIAGNOSIS — L219 Seborrheic dermatitis, unspecified: Secondary | ICD-10-CM

## 2023-12-01 DIAGNOSIS — L821 Other seborrheic keratosis: Secondary | ICD-10-CM

## 2023-12-01 DIAGNOSIS — D0430 Carcinoma in situ of skin of unspecified part of face: Secondary | ICD-10-CM

## 2023-12-01 DIAGNOSIS — R21 Rash and other nonspecific skin eruption: Secondary | ICD-10-CM

## 2023-12-01 HISTORY — DX: Squamous cell carcinoma of skin, unspecified: C44.92

## 2023-12-01 MED ORDER — CLOBETASOL PROPIONATE 0.05 % EX SOLN: CUTANEOUS | 5 refills | Status: AC

## 2023-12-01 MED ORDER — KETOCONAZOLE 2 % EX SHAM
MEDICATED_SHAMPOO | CUTANEOUS | 5 refills | Status: AC
Start: 2023-12-01 — End: ?

## 2023-12-01 NOTE — Patient Instructions (Signed)
 The bandage should remain in place until tomorrow. Remove the bandages and clean the wound once daily as follows:  - Wash your hands. Clean the wound gently with soap and water, and then pat dry. Do not rub. Apply a small amount of Petroleum Jelly or Vaseline. Cover the area with a Band-Aid.  A small amount of bleeding is normal. If bleeding persists, apply firm pressure over the bandage for 5 to 10 minutes without interruption. If bleeding continues, call our office. Continue to clean the area as directed above until the wound is healed. Shave biopsies may take several weeks to heal. It is normal if the edges are pink/red and the center is slight yellowish or white in color. However if the site becomes hot, swollen, has a thick drainage or redness that expands away from the site please call us . We will contact you with results once available.    Cryosurgery  Cryosurgery ("freezing") uses liquid nitrogen to destroy certain types of skin lesions. Lowering the temperature of the lesion in a small area surrounding skin destroys the lesion. Immediately following cryosurgery, you will notice redness and swelling of the treatment area. Blistering or weeping may occur, lasting approximately one week which will then be followed by crusting. Most areas will heal completely in 10 to 14 days.  Wash the treated areas daily. Allow soap and water to run over the areas, but do not scrub. Should a scab or crust form, allow it to fall off on its own. Do not remove or pick at it. Application of an ointment  and a bandage may make you feel more comfortable, but it is not necessary. Some people develop an allergy to Neosporin, so we recommend that Vaseline or  Aquaphor be used.  The cryotherapy site will be more sensitive than your surrounding skin. Keep it covered, and remember to apply sunscreen every day to all your sun exposed skin. A scar may remain which is lighter or pinker than your normal skin. Your body will  continue to improve your scar for up to one year; however a light-colored scar may remain.  Infection following cryotherapy is rare. However if you are worried about the appearance of the treated area, contact your doctor. We have a physician on call at all times. If you have any concerns about the site, please call our clinic at [phone]       Skin Care and Sun Protection  Your skin plays an important role in keeping the entire body healthy. Below are some tips on how to try and maximize skin health from the outside in.  Bathing  Bathe in mildly warm water every 1 to 2 days, followed by light drying and an application of a thick moisturizer cream or ointment, preferably one that comes in a tub.  Recommended body soaps/washes: - Cerave Hydrating Cleanser Bar - Dove Sensitive Skin Fragrance Free Beauty Bar - Aveeno Active Naturals Skin Relief Body Wash, Fragrance Free - Free & Clear (vanicream) liquid cleanser  Moisturizer  Body moisturizer: Apply a moisturizer throughout the day and after bathing.  When you moisturize after bathing, this locks in the moisture.  This can lead to softer and smoother skin.  Body moisturizers come in ointments, creams, and lotions.  If you have dry skin, we recommend the use of ointments or creams rather than lotions.  In other words, something you scoop out of a jar rather than squirted out.  Ointments and creams are thicker and thus provide better moisturization.  Recommended creams for all over: - Vanicream cream - CeraVe Moisturizing Cream - Eucerin Original Healing Soothing Repair Cream  Recommended ointments: greasy, but do the best job at moisturization - Plain Vaseline (petroleum jelly) - CeraVe Healing ointment - Aquaphor Healing ointment  Face moisturizers: For your face, look for something that is labeled as non-comedogenic (won't clog pores) and oil-free. Your moisturizer for the day should have SPF 30 or higher in it as well, but  your moisturizer for night can be without SPF. Some good examples are: - CeraVe Moisturizing Cream (can be used as a face moisturizer) - La Roche-Posay Toleriane Double Repair Facial Moisturizer with SPF 30 (my favorite for day time) - CeraVe AM (has SPF 30) - CeraVe PM  Sunscreen  Who needs sunscreen? Everyone. Sunscreen use can help prevent skin cancer by protecting you from the sun's harmful ultraviolet rays. Anyone can get skin cancer, regardless of age, gender or race. In fact, it is estimated that one in five Americans will develop skin cancer in their lifetime.  Sunscreen alone cannot fully protect you. In addition to wearing sunscreen, dermatologists recommend taking the following steps to protect your skin and find skin cancer early:  Seek shade when appropriate, remembering that the sun's rays are strongest between 10 a.m. and 2 p.m. If your shadow is shorter than you are, seek shade. Dress to protect yourself from the sun by wearing a lightweight long-sleeved shirt, pants, a wide-brimmed hat and sunglasses, when possible.  Use extra caution near water, snow and sand as they reflect the damaging rays of the sun, which can increase your chance of sunburn.  Get vitamin D  safely through a healthy diet that may include vitamin supplements. Don't seek the sun. Avoid tanning beds. Ultraviolet light from the sun and tanning beds can cause skin cancer and wrinkling. If you want to look tan, you may wish to use a self-tanning product, but continue to use sunscreen with it.  When should I use sunscreen? Every day you go outside--even if you're just walking to and from your form of transportation. The sun emits harmful UV rays year-round. Even on cloudy days, up to 80 percent of the sun's harmful UV rays can penetrate your skin. Snow, sand and water increase the need for sunscreen because they reflect the sun's rays.  How much sunscreen should I use, and how often should I apply it? Most people  only apply 25-50 percent of the recommended amount of sunscreen. Apply enough sunscreen to cover all exposed skin. Most adults need about 1 ounce -- or enough to fill a shot glass -- to fully cover their body.  Don't forget to apply to the tops of your feet, your neck, your ears and the top of your head. Apply sunscreen to dry skin 15 minutes before going outdoors.  Skin cancer also can form on the lips. To protect your lips, apply a lip balm or lipstick that contains sunscreen with an SPF of 30 or higher.  When outdoors, reapply sunscreen approximately every two hours, or after swimming or sweating, according to the directions on the bottle.   Broad-spectrum sunscreens protect against both UVA and UVB rays. What is the difference between the rays? Sunlight consists of two types of harmful rays that reach the earth -- UVA rays and UVB rays. Overexposure to either can lead to skin cancer. In addition to causing skin cancer, here's what each of these rays do:  UVA rays (or aging rays) can prematurely age your  skin, causing wrinkles and age spots, and can pass through window glass. UVB rays (or burning rays) are the primary cause of sunburn and are blocked by window glass  There is no safe way to tan. Every time you tan, you damage your skin. As this damage builds, you speed up the aging of your skin and increase your risk for all types of skin cancer.  What is the difference between chemical and physical sunscreens? Chemical sunscreens work like a sponge, absorbing the sun's rays. They contain one or more of the following active ingredients: oxybenzone, avobenzone, octisalate, octocrylene, homosalate and octinoxate. These formulations tend to be easier to rub into the skin without leaving a white residue.   Physical sunscreens work like a shield, sitting sit on the surface of your skin and deflecting the sun's rays. They contain the active ingredients zinc oxide and/or titanium dioxide. Use this  sunscreen if you have sensitive skin.   What type of sunscreen should I use? The best type of sunscreen is the one you will use again and again. Just make sure it offers broad-spectrum (UVA and UVB) protection, has an SPF of 30+, and is water-resistant. The kind of sunscreen you use is a matter of personal choice, and may vary depending on the area of the body to be protected. Available sunscreen options include lotions, creams, gels, ointments, wax sticks and sprays.  Recommended physical sunscreens for face: - Neutrogena Sheer Zinc - Aveeno Positively Mineral Sensitive - CeraVe Hydrating Mineral (also has a tinted version) - La Roche-Posay Anthelios Mineral Face (comes as a cream, lotion, light fluid, and there is also a tinted version).  - EltaMD UV Clear (also has a tinted version)  Recommended physical sunscreens for body: - Neutrogena Sheer Zinc Dry-Touch Sunscreen Sensitive Skin Lotion Broad Spectrum SPF 50 - Aveeno Positively Mineral Sensitive Skin Sunscreen Broad Spectrum SPF 50 - La Roche-Posay Anthelios SPF 50 Mineral Sunscreen - Gentle Lotion - CeraVe Hydrating Mineral Sunscreen SPF 50  Recommended chemical sunscreens for face: - Anthelios UV Correct Face Sunscreen SPF 70 with Niacinamide - Neutrogena Clear Face Oil-Free SPF 50 with Helioplex - Neutrogena Sport Face Oil-Free SPF 70+ with Helioplex - Aveeno Protect + Hydrate Sunscreen For Face SPF 70 - La Roche-Posay Anthelios Light Fluid Sunscreen for Face SPF 60  Recommended chemical sunscreens for body: - Neutrogena Ultra Sheer Dry-Touch Sunscreen SPF 70 - Aveeno Protect + Hydrate Broad Spectrum All-Day Hydration SPF 60 (comes in a big pump) - La Roche-Posay Anthelios Melt-In Milk Sunscreen SPF 60  Recommended UPF Clothing - Coolibar  - Solbari  - Wallaroo hats  - Materials Engineer (On [employer])    Due to recent changes in healthcare laws, you may see results of your pathology and/or laboratory studies on MyChart before the  doctors have had a chance to review them. We understand that in some cases there may be results that are confusing or concerning to you. Please understand that not all results are received at the same time and often the doctors may need to interpret multiple results in order to provide you with the best plan of care or course of treatment. Therefore, we ask that you please give us  2 business days to thoroughly review all your results before contacting the office for clarification. Should we see a critical lab result, you will be contacted sooner.   If You Need Anything After Your Visit  If you have any questions or concerns for your doctor, please call our main line at  [phone] and press option 4 to reach your doctor's medical assistant. If no one answers, please leave a voicemail as directed and we will return your call as soon as possible. Messages left after 4 pm will be answered the following business day.   You may also send us  a message via MyChart. We typically respond to MyChart messages within 1-2 business days.  For prescription refills, please ask your pharmacy to contact our office. Our fax number is [phone].  If you have an urgent issue when the clinic is closed that cannot wait until the next business day, you can page your doctor at the number below.    Please note that while we do our best to be available for urgent issues outside of office hours, we are not available 24/7.   If you have an urgent issue and are unable to reach us , you may choose to seek medical care at your doctor's office, retail clinic, urgent care center, or emergency room.  If you have a medical emergency, please immediately call 911 or go to the emergency department.  Pager Numbers  - Dr. Hester: [phone]  - Dr. Jackquline: [phone]  - Dr. Claudene: [phone]   - Dr. Raymund: [phone]  In the event of inclement weather, please call our main line at [phone] for an update on the  status of any delays or closures.  Dermatology Medication Tips: Please keep the boxes that topical medications come in in order to help keep track of the instructions about where and how to use these. Pharmacies typically print the medication instructions only on the boxes and not directly on the medication tubes.   If your medication is too expensive, please contact our office at [phone] option 4 or send us  a message through MyChart.   We are unable to tell what your co-pay for medications will be in advance as this is different depending on your insurance coverage. However, we may be able to find a substitute medication at lower cost or fill out paperwork to get insurance to cover a needed medication.   If a prior authorization is required to get your medication covered by your insurance company, please allow us  1-2 business days to complete this process.  Drug prices often vary depending on where the prescription is filled and some pharmacies may offer cheaper prices.  The website www.goodrx.com contains coupons for medications through different pharmacies. The prices here do not account for what the cost may be with help from insurance (it may be cheaper with your insurance), but the website can give you the price if you did not use any insurance.  - You can print the associated coupon and take it with your prescription to the pharmacy.  - You may also stop by our office during regular business hours and pick up a GoodRx coupon card.  - If you need your prescription sent electronically to a different pharmacy, notify our office through [facility] or by phone at [phone] option 4.     Si Usted Necesita Algo Despus de Su Visita  Tambin puede enviarnos un mensaje a travs de Clinical Cytogeneticist. Por lo general respondemos a los mensajes de MyChart en el transcurso de 1 a 2 das hbiles.  Para renovar recetas, por favor pida a su farmacia que se ponga en contacto con nuestra oficina.  [place] de fax es [place] [phone].  Si tiene un asunto urgente cuando la clnica est cerrada y que no puede esperar hasta el siguiente da  hbil, puede llamar/localizar a su doctor(a) al nmero que aparece a continuacin.   Por favor, tenga en cuenta que aunque hacemos todo lo posible para estar disponibles para asuntos urgentes fuera del horario de [place], no estamos disponibles las 24 horas del da, los 7 [address]  [address] de la [place].   Si tiene un problema urgente y no puede comunicarse con nosotros, puede optar por buscar atencin mdica  en el consultorio de su doctor(a), en una clnica privada, en un centro de atencin urgente o en una sala de emergencias.  Si tiene engineer, drilling, por favor llame inmediatamente al 911 o vaya a la sala de emergencias.  Nmeros de bper  - Dr. Hester: [phone]  - Dra. Jackquline: [phone]  - Dr. Claudene: [phone]  - Dra. Kitts: [phone]  En caso de inclemencias del [place], por favor llame a nuestra lnea principal al [phone] para una actualizacin sobre el estado de cualquier retraso o cierre.  Consejos para la medicacin en dermatologa: Por favor, guarde las cajas en las que vienen los medicamentos de uso tpico para ayudarle a seguir las instrucciones sobre dnde y cmo usarlos. Las farmacias generalmente imprimen las instrucciones del medicamento slo en las cajas y no directamente en los tubos del [place].   Si su medicamento es muy caro, por favor, pngase en contacto con landry rieger llamando al [phone] y presione la opcin 4 o envenos un mensaje a travs de Clinical Cytogeneticist.   No podemos decirle cul ser su copago por los medicamentos por adelantado ya que esto es diferente dependiendo de la cobertura de su seguro. Sin embargo, es posible que podamos encontrar un medicamento sustituto a audiological scientist un formulario para que el seguro cubra el medicamento que se considera necesario.   Si se requiere una  autorizacin previa para que su compaa de seguros cubra su medicamento, por favor permtanos de 1 a 2 das hbiles para completar este proceso.  Los precios de los medicamentos varan con frecuencia dependiendo del environmental consultant de dnde se surte la receta y alguna farmacias pueden ofrecer precios ms baratos.  El sitio web www.goodrx.com tiene cupones para medicamentos de health and safety inspector. Los precios aqu no tienen en cuenta lo que podra costar con la ayuda del seguro (puede ser ms barato con su seguro), pero el sitio web puede darle el precio si no utiliz tourist information centre manager.  - Puede imprimir el cupn correspondiente y llevarlo con su receta a la farmacia.  - Tambin puede pasar por nuestra oficina durante el horario de atencin regular y education officer, museum una tarjeta de cupones de GoodRx.  - Si necesita que su receta se enve electrnicamente a una farmacia diferente, informe a nuestra oficina a travs de MyChart de [place] o por telfono llamando al [phone] y presione la opcin 4.

## 2023-12-01 NOTE — Progress Notes (Signed)
 Subjective   Kathleen Wallace is a 89 y.o. female who presents for the following: waist up skin exam. Patient is new patient  Today patient reports: PCP recommended having lesion on back checked. Patient states it has been there for months, itchy.  LOC L lower lip - comes and goes Several other LOC on face and trunk   Review of Systems:    No other skin or systemic complaints except as noted in HPI or Assessment and Plan.  The following portions of the chart were reviewed this encounter and updated as appropriate: medications, allergies, medical history  Relevant Medical History:  Personal history of non melanoma skin cancer - see medical history for full details   Objective  (SKPE) Well appearing patient in no apparent distress; mood and affect are within normal limits. Examination was performed of the: Waist Up Skin Exam: scalp, head, eyes, ears, nose, lips, neck, chest, axillae, upper extremities, abdomen, back, hands, fingers, fingernails   Examination notable for: Angioma(s): Scattered red vascular papule(s)  , Lentigo/lentigines: Scattered pigmented macules that are tan to brown in color and are somewhat non-uniform in shape and concentrated in the sun-exposed areas, Nevus/nevi: Scattered well-demarcated, regular, pigmented macule(s) and/or papule(s)  , Seborrheic Keratosis(es): Stuck-on appearing keratotic papule(s) on the trunk, some  irritated with redness, crusting, edema, and/or partial avulsion, Actinic Damage/Elastosis: chronic sun damage: dyspigmentation, telangiectasia, and wrinkling  Crusted papules of L lower lip  - Erythema and scaling of the scalp, ear canals, and central face > rest of face.  Examination limited by: Undergarments, pants - deferred removal  Left Chin 8 mm pink scaly papule  Right cheek x1 Erythematous thin papules/macules with gritty scale.  Back x3 (3) Erythematous keratotic or waxy stuck-on papule or plaque.  Assessment & Plan  (SKAP)   BENIGN  SKIN FINDINGS  - Lentigines  - Seborrheic keratoses  - Hemangiomas   - Nevus/Multiple Benign Nevi - Reassurance provided regarding the benign appearance of lesions noted on exam today; no treatment is indicated in the absence of symptoms/changes. - Reinforced importance of photoprotective strategies including liberal and frequent sunscreen use of a broad-spectrum SPF 30 or greater, use of protective clothing, and sun avoidance for prevention of cutaneous malignancy and photoaging.  Counseled patient on the importance of regular self-skin monitoring as well as routine clinical skin examinations as scheduled.   ACTINIC DAMAGE - Chronic condition, secondary to cumulative UV/sun exposure - Recommend daily broad spectrum sunscreen SPF 30+ to sun-exposed areas, reapply every 2 hours as needed.  - Staying in the shade or wearing long sleeves, sun glasses (UVA+UVB protection) and wide brim hats (4-inch brim around the entire circumference of the hat) are also recommended for sun protection.  - Call for new or changing lesions.  Personal history of non melanoma skin cancer  - Reviewed medical history for full details  - Reviewed sun protective measures as above - Encouraged full body skin exams     Seborrheic dermatitis Chronic and persistent condition with duration or expected duration over one year. Condition is symptomatic and bothersome to patient. Patient is flaring and not currently at treatment goal.  - Discussed diagnosis, typical course, and treatment options for this condition - Explained to the patient the chronic nature of this diagnosis - Start Clobetasol 0.05% solution to affect areas a needed  - Start ketoconazole 2% shampoo TIW, apply to scalp and affected areas of face/body and allow the shampoo to sit for 5-10 minutes before rinsing off  Intermittent crusted papules of L lower lip - swabbed for hsv/vzv   Procedures, orders, diagnosis for this visit:  NEOPLASM OF SKIN Left  Chin Skin / nail biopsy Type of biopsy: tangential   Informed consent: discussed and consent obtained   Timeout: patient name, date of birth, surgical site, and procedure verified   Procedure prep:  Patient was prepped and draped in usual sterile fashion Prep type:  Isopropyl alcohol Anesthesia: the lesion was anesthetized in a standard fashion   Anesthetic:  1% lidocaine  w/ epinephrine  1-100,000 buffered w/ 8.4% NaHCO3 Instrument used: DermaBlade   Hemostasis achieved with: pressure and aluminum chloride   Outcome: patient tolerated procedure well   Post-procedure details: sterile dressing applied and wound care instructions given   Dressing type: bandage and petrolatum    Specimen 1 - Surgical pathology Differential Diagnosis: BCC vs SCC  Check Margins: No HSV (HERPES SIMPLEX VIRUS) INFECTION   Related Procedures HSV and VZV PCR Panel AK (ACTINIC KERATOSIS) Right cheek x1 Actinic keratoses are precancerous spots that appear secondary to cumulative UV radiation exposure/sun exposure over time. They are chronic with expected duration over 1 year. A portion of actinic keratoses will progress to squamous cell carcinoma of the skin. It is not possible to reliably predict which spots will progress to skin cancer and so treatment is recommended to prevent development of skin cancer.  Recommend daily broad spectrum sunscreen SPF 30+ to sun-exposed areas, reapply every 2 hours as needed.  Recommend staying in the shade or wearing long sleeves, sun glasses (UVA+UVB protection) and wide brim hats (4-inch brim around the entire circumference of the hat). Call for new or changing lesions. Destruction of lesion - Right cheek x1 Complexity: simple   Destruction method: cryotherapy   Informed consent: discussed and consent obtained   Timeout:  patient name, date of birth, surgical site, and procedure verified Lesion destroyed using liquid nitrogen: Yes   Region frozen until ice ball extended  beyond lesion: Yes   Cryo cycles: 1 or 2. Outcome: patient tolerated procedure well with no complications   Post-procedure details: wound care instructions given   Additional details:  Prior to procedure, discussed risks of blister formation, small wound, skin dyspigmentation, or rare scar following cryotherapy. Recommend Vaseline ointment to treated areas while healing.   INFLAMED SEBORRHEIC KERATOSIS (3) Back x3 (3) Symptomatic, irritating, patient would like treated. Destruction of lesion - Back x3 (3) Complexity: simple   Destruction method: cryotherapy   Informed consent: discussed and consent obtained   Timeout:  patient name, date of birth, surgical site, and procedure verified Lesion destroyed using liquid nitrogen: Yes   Region frozen until ice ball extended beyond lesion: Yes   Cryo cycles: 1 or 2. Outcome: patient tolerated procedure well with no complications   Post-procedure details: wound care instructions given   Additional details:  Prior to procedure, discussed risks of blister formation, small wound, skin dyspigmentation, or rare scar following cryotherapy. Recommend Vaseline ointment to treated areas while healing.   LENTIGO   SEBORRHEIC KERATOSIS   MULTIPLE BENIGN NEVI   CHERRY ANGIOMA   ACTINIC SKIN DAMAGE   ACTINIC KERATOSIS   RASH AND OTHER NONSPECIFIC SKIN ERUPTION   SEBORRHEIC DERMATITIS    HSV (herpes simplex virus) infection -     HSV and VZV PCR Panel  Neoplasm of skin -     Skin / nail biopsy -     Surgical pathology; Standing  AK (actinic keratosis) -     Destruction of  lesion  Inflamed seborrheic keratosis -     Destruction of lesion  Lentigo  Seborrheic keratosis  Multiple benign nevi  Cherry angioma  Actinic skin damage  Actinic keratosis  Rash and other nonspecific skin eruption  Seborrheic dermatitis  Other orders -     Ketoconazole; Apply to scalp as a shampoo 2-3 times weekly. Let sit for 5 minutes before  rinsing.  Dispense: 120 mL; Refill: 5 -     Clobetasol Propionate; Apply 1 mL to affected areas of skin twice daily  Dispense: 50 mL; Refill: 5    Return to clinic: Return if symptoms worsen or fail to improve.  I, Jill Parcell, CMA, am acting as scribe for Lauraine JAYSON Kanaris, MD.   Documentation: I have reviewed the above documentation for accuracy and completeness, and I agree with the above.  Lauraine JAYSON Kanaris, MD

## 2023-12-03 LAB — SURGICAL PATHOLOGY

## 2023-12-03 LAB — HSV AND VZV PCR PANEL
HSV 2 DNA: NEGATIVE
HSV-1 DNA: POSITIVE — AB
Varicella-Zoster, PCR: NEGATIVE

## 2023-12-07 ENCOUNTER — Ambulatory Visit: Payer: Self-pay

## 2023-12-07 NOTE — Progress Notes (Signed)
 Patient informed and states she is asymptomatic at this time. She voiced good understanding and denied questions or concerns at this time.

## 2023-12-08 DIAGNOSIS — H401133 Primary open-angle glaucoma, bilateral, severe stage: Secondary | ICD-10-CM | POA: Diagnosis not present

## 2023-12-08 DIAGNOSIS — H3581 Retinal edema: Secondary | ICD-10-CM | POA: Diagnosis not present

## 2023-12-08 DIAGNOSIS — H35372 Puckering of macula, left eye: Secondary | ICD-10-CM | POA: Diagnosis not present

## 2023-12-08 DIAGNOSIS — Z961 Presence of intraocular lens: Secondary | ICD-10-CM | POA: Diagnosis not present

## 2023-12-08 DIAGNOSIS — H26491 Other secondary cataract, right eye: Secondary | ICD-10-CM | POA: Diagnosis not present

## 2023-12-10 DIAGNOSIS — I5020 Unspecified systolic (congestive) heart failure: Secondary | ICD-10-CM | POA: Diagnosis not present

## 2023-12-10 DIAGNOSIS — J45909 Unspecified asthma, uncomplicated: Secondary | ICD-10-CM | POA: Diagnosis not present

## 2023-12-10 DIAGNOSIS — I10 Essential (primary) hypertension: Secondary | ICD-10-CM | POA: Diagnosis not present

## 2023-12-10 DIAGNOSIS — I480 Paroxysmal atrial fibrillation: Secondary | ICD-10-CM | POA: Diagnosis not present

## 2023-12-10 DIAGNOSIS — R0602 Shortness of breath: Secondary | ICD-10-CM | POA: Diagnosis not present

## 2023-12-14 NOTE — Telephone Encounter (Signed)
 1. 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 Advised to hold treatment if developing side effects.  - Wash hands after use - Advised sun protection and avoidance  Schedule recheck 4 months   Thx!

## 2023-12-15 ENCOUNTER — Other Ambulatory Visit: Payer: Self-pay | Admitting: Internal Medicine

## 2023-12-15 DIAGNOSIS — I5020 Unspecified systolic (congestive) heart failure: Secondary | ICD-10-CM

## 2023-12-15 DIAGNOSIS — R0602 Shortness of breath: Secondary | ICD-10-CM

## 2023-12-15 NOTE — Telephone Encounter (Signed)
 Left voicemail to return my call

## 2023-12-15 NOTE — Telephone Encounter (Signed)
-----   Message from Lauraine JAYSON Kanaris, MD sent at 12/14/2023  1:17 PM EST -----

## 2023-12-23 ENCOUNTER — Ambulatory Visit
Admission: RE | Admit: 2023-12-23 | Discharge: 2023-12-23 | Disposition: A | Payer: Self-pay | Source: Ambulatory Visit | Attending: Internal Medicine | Admitting: Internal Medicine

## 2023-12-23 DIAGNOSIS — R0602 Shortness of breath: Secondary | ICD-10-CM | POA: Insufficient documentation

## 2023-12-23 DIAGNOSIS — I5020 Unspecified systolic (congestive) heart failure: Secondary | ICD-10-CM | POA: Insufficient documentation

## 2023-12-29 ENCOUNTER — Telehealth: Payer: Self-pay

## 2023-12-29 ENCOUNTER — Other Ambulatory Visit: Payer: Self-pay | Admitting: Internal Medicine

## 2023-12-29 NOTE — Telephone Encounter (Signed)
 Copied from CRM [id number]. Topic: Clinical - Prescription Issue >> Dec 29, 2023 10:08 AM Robinson H wrote: Reason for CRM: Patient calling in a little panic due to pharmacy stating she doesn't have any more refills for her albuterol  (PROVENTIL ) (2.5 MG/3ML) 0.083% nebulizer solution, advised patient that pharmacy has sent over a request and request takes up to 3 business days. Patient states she's not out but wants to make sure she doesn't want to run out since it helps her breathe and sometimes the pharmacy runs.  Ronal [phone]

## 2023-12-31 ENCOUNTER — Telehealth: Payer: Self-pay

## 2023-12-31 NOTE — Telephone Encounter (Signed)
-----   Message from Lauraine Kanaris, MD sent at 12/31/2023  8:15 AM EST ----- I don't see that the efudex was ever sent/follow up scheduled - can you please make sure that was sent/she knows to treat the scc? Thx!

## 2023-12-31 NOTE — Telephone Encounter (Signed)
Left message on voicemail to return my call.  

## 2024-01-06 ENCOUNTER — Other Ambulatory Visit: Payer: Self-pay | Admitting: Internal Medicine

## 2024-01-06 ENCOUNTER — Ambulatory Visit
Admission: RE | Admit: 2024-01-06 | Discharge: 2024-01-06 | Disposition: A | Source: Ambulatory Visit | Attending: Internal Medicine | Admitting: Internal Medicine

## 2024-01-06 DIAGNOSIS — R0602 Shortness of breath: Secondary | ICD-10-CM | POA: Diagnosis present

## 2024-01-06 DIAGNOSIS — I5020 Unspecified systolic (congestive) heart failure: Secondary | ICD-10-CM | POA: Diagnosis present

## 2024-01-06 MED ORDER — GADOBUTROL 1 MMOL/ML IV SOLN
8.0000 mL | Freq: Once | INTRAVENOUS | Status: AC | PRN
  Administered 2024-01-06: 14:00:00 8 mL via INTRAVENOUS

## 2024-01-07 NOTE — Telephone Encounter (Signed)
 Left voicemail to return my call

## 2024-01-18 NOTE — Telephone Encounter (Signed)
"  Left voicemail to return my call  "

## 2024-01-19 ENCOUNTER — Telehealth: Payer: Self-pay

## 2024-01-19 NOTE — Telephone Encounter (Signed)
-----   Message from [facility] sent at 01/07/2024  2:42 PM EST ----- I left another voicemail asking patient to return my call. ----- Message ----- From: Raymund Lauraine BROCKS, MD Sent: 01/07/2024   1:00 PM EST To: [facility]  Could someone please check in with pt again re biopsy results, efudex - thx!

## 2024-01-19 NOTE — Telephone Encounter (Signed)
 Called both numbers listed in chart. No answer. LMTRC.

## 2024-01-25 ENCOUNTER — Telehealth: Payer: Self-pay

## 2024-01-25 NOTE — Telephone Encounter (Signed)
 Biopsy Results Communication Attempt: Multiple attempts have been made to contact the patient by phone to discuss biopsy results on the following dates: 12/31/23, 01/07/24, 01/18/24, 01/19/24, and 01/25/24.  The patient has not answered; voicemails were left requesting return call.  Due to inability to reach the patient by phone, a certified letter will be sent to communicate results and recommendations.

## 2024-01-25 NOTE — Telephone Encounter (Signed)
-----   Message from [facility] sent at 01/07/2024  2:42 PM EST ----- I left another voicemail asking patient to return my call. ----- Message ----- From: Raymund Lauraine BROCKS, MD Sent: 01/07/2024   1:00 PM EST To: [facility]  Could someone please check in with pt again re biopsy results, efudex - thx!

## 2024-02-02 ENCOUNTER — Telehealth: Payer: Self-pay

## 2024-02-02 MED ORDER — FLUOROURACIL 5 % EX CREA: TOPICAL_CREAM | CUTANEOUS | 0 refills | Status: AC

## 2024-02-02 MED ORDER — FLUOROURACIL 5 % EX CREA: TOPICAL_CREAM | CUTANEOUS | 0 refills | Status: DC

## 2024-02-02 NOTE — Telephone Encounter (Signed)
 Discussed biopsy results with patient, I will call in 5FU apply to left chin twice a day for 8 weeks, return to the office in 4 months

## 2024-02-02 NOTE — Addendum Note (Signed)
 Addended by: RAYMUND LAURAINE BROCKS on: 02/02/2024 02:39 PM   Modules accepted: Orders
